# Patient Record
Sex: Male | Born: 1970 | Race: White | Hispanic: No | Marital: Single | Smoking: Never smoker
Health system: Southern US, Community
[De-identification: ages and names within clinical notes are randomized; demographics above are authoritative.]

## PROBLEM LIST (undated history)

## (undated) DIAGNOSIS — F909 Attention-deficit hyperactivity disorder, unspecified type: Secondary | ICD-10-CM

## (undated) DIAGNOSIS — F329 Major depressive disorder, single episode, unspecified: Secondary | ICD-10-CM

## (undated) DIAGNOSIS — F1011 Alcohol abuse, in remission: Secondary | ICD-10-CM

## (undated) DIAGNOSIS — I712 Thoracic aortic aneurysm, without rupture, unspecified: Secondary | ICD-10-CM

## (undated) DIAGNOSIS — F419 Anxiety disorder, unspecified: Secondary | ICD-10-CM

## (undated) DIAGNOSIS — R569 Unspecified convulsions: Secondary | ICD-10-CM

## (undated) DIAGNOSIS — I10 Essential (primary) hypertension: Secondary | ICD-10-CM

## (undated) DIAGNOSIS — I2699 Other pulmonary embolism without acute cor pulmonale: Secondary | ICD-10-CM

## (undated) DIAGNOSIS — I4891 Unspecified atrial fibrillation: Secondary | ICD-10-CM

## (undated) DIAGNOSIS — F32A Depression, unspecified: Secondary | ICD-10-CM

## (undated) DIAGNOSIS — F1010 Alcohol abuse, uncomplicated: Secondary | ICD-10-CM

## (undated) DIAGNOSIS — Q249 Congenital malformation of heart, unspecified: Secondary | ICD-10-CM

## (undated) DIAGNOSIS — J45909 Unspecified asthma, uncomplicated: Secondary | ICD-10-CM

## (undated) DIAGNOSIS — E785 Hyperlipidemia, unspecified: Secondary | ICD-10-CM

## (undated) DIAGNOSIS — F988 Other specified behavioral and emotional disorders with onset usually occurring in childhood and adolescence: Secondary | ICD-10-CM

## (undated) HISTORY — DX: Other pulmonary embolism without acute cor pulmonale: I26.99

## (undated) HISTORY — DX: Hyperlipidemia, unspecified: E78.5

## (undated) HISTORY — DX: Unspecified convulsions: R56.9

## (undated) HISTORY — DX: Congenital malformation of heart, unspecified: Q24.9

## (undated) HISTORY — PX: DENTAL SURGERY: SHX609

## (undated) HISTORY — DX: Thoracic aortic aneurysm, without rupture, unspecified: I71.20

## (undated) HISTORY — PX: COLONOSCOPY: SHX174

## (undated) HISTORY — PX: CARDIAC SURGERY: SHX584

## (undated) HISTORY — PX: CARDIAC CATHETERIZATION: SHX172

---

## 2002-01-23 HISTORY — PX: CHOLECYSTECTOMY: SHX55

## 2002-01-23 HISTORY — PX: LIVER BIOPSY: SHX301

## 2012-03-20 DIAGNOSIS — E559 Vitamin D deficiency, unspecified: Secondary | ICD-10-CM | POA: Insufficient documentation

## 2013-07-16 DIAGNOSIS — F1010 Alcohol abuse, uncomplicated: Secondary | ICD-10-CM | POA: Insufficient documentation

## 2013-11-06 DIAGNOSIS — F329 Major depressive disorder, single episode, unspecified: Secondary | ICD-10-CM | POA: Insufficient documentation

## 2013-11-06 DIAGNOSIS — E782 Mixed hyperlipidemia: Secondary | ICD-10-CM | POA: Insufficient documentation

## 2013-11-06 DIAGNOSIS — F32A Depression, unspecified: Secondary | ICD-10-CM | POA: Insufficient documentation

## 2013-11-06 DIAGNOSIS — F988 Other specified behavioral and emotional disorders with onset usually occurring in childhood and adolescence: Secondary | ICD-10-CM | POA: Insufficient documentation

## 2013-11-06 DIAGNOSIS — J45909 Unspecified asthma, uncomplicated: Secondary | ICD-10-CM | POA: Insufficient documentation

## 2013-11-06 DIAGNOSIS — K7010 Alcoholic hepatitis without ascites: Secondary | ICD-10-CM | POA: Insufficient documentation

## 2015-04-13 DIAGNOSIS — K852 Alcohol induced acute pancreatitis without necrosis or infection: Secondary | ICD-10-CM | POA: Insufficient documentation

## 2015-04-13 DIAGNOSIS — K700 Alcoholic fatty liver: Secondary | ICD-10-CM | POA: Insufficient documentation

## 2015-09-24 ENCOUNTER — Ambulatory Visit: Payer: Self-pay | Admitting: Family Medicine

## 2015-11-09 ENCOUNTER — Emergency Department: Payer: Self-pay

## 2015-11-09 ENCOUNTER — Encounter: Payer: Self-pay | Admitting: Emergency Medicine

## 2015-11-09 ENCOUNTER — Emergency Department
Admission: EM | Admit: 2015-11-09 | Discharge: 2015-11-09 | Disposition: A | Discharge status: Home / Self Care | Payer: Self-pay | Attending: Emergency Medicine | Admitting: Emergency Medicine

## 2015-11-09 DIAGNOSIS — Y999 Unspecified external cause status: Secondary | ICD-10-CM | POA: Insufficient documentation

## 2015-11-09 DIAGNOSIS — Y929 Unspecified place or not applicable: Secondary | ICD-10-CM | POA: Insufficient documentation

## 2015-11-09 DIAGNOSIS — Y939 Activity, unspecified: Secondary | ICD-10-CM | POA: Insufficient documentation

## 2015-11-09 DIAGNOSIS — F909 Attention-deficit hyperactivity disorder, unspecified type: Secondary | ICD-10-CM | POA: Insufficient documentation

## 2015-11-09 DIAGNOSIS — X58XXXA Exposure to other specified factors, initial encounter: Secondary | ICD-10-CM | POA: Insufficient documentation

## 2015-11-09 DIAGNOSIS — S39012A Strain of muscle, fascia and tendon of lower back, initial encounter: Secondary | ICD-10-CM | POA: Insufficient documentation

## 2015-11-09 DIAGNOSIS — J45909 Unspecified asthma, uncomplicated: Secondary | ICD-10-CM | POA: Insufficient documentation

## 2015-11-09 HISTORY — DX: Major depressive disorder, single episode, unspecified: F32.9

## 2015-11-09 HISTORY — DX: Attention-deficit hyperactivity disorder, unspecified type: F90.9

## 2015-11-09 HISTORY — DX: Unspecified asthma, uncomplicated: J45.909

## 2015-11-09 HISTORY — DX: Depression, unspecified: F32.A

## 2015-11-09 HISTORY — DX: Anxiety disorder, unspecified: F41.9

## 2015-11-09 MED ORDER — PREDNISONE 10 MG (21) PO TBPK: ORAL_TABLET | ORAL | 0 refills | Status: DC

## 2015-11-09 MED ORDER — IBUPROFEN 800 MG PO TABS: 800.0000 mg | ORAL_TABLET | Freq: Three times a day (TID) | ORAL | 0 refills | Status: DC | PRN

## 2015-11-09 MED ORDER — KETOROLAC TROMETHAMINE 30 MG/ML IJ SOLN
30.0000 mg | Freq: Once | INTRAMUSCULAR | Status: AC
  Administered 2015-11-09: 30 mg via INTRAVENOUS
  Filled 2015-11-09: qty 1

## 2015-11-09 MED ORDER — DEXAMETHASONE SODIUM PHOSPHATE 10 MG/ML IJ SOLN
10.0000 mg | Freq: Once | INTRAMUSCULAR | Status: AC
  Administered 2015-11-09: 10 mg via INTRAVENOUS
  Filled 2015-11-09: qty 1

## 2015-11-09 MED ORDER — DIAZEPAM 5 MG/ML IJ SOLN
5.0000 mg | Freq: Once | INTRAMUSCULAR | Status: AC
  Administered 2015-11-09: 5 mg via INTRAVENOUS
  Filled 2015-11-09: qty 2

## 2015-11-09 MED ORDER — HYDROMORPHONE HCL 1 MG/ML IJ SOLN
0.5000 mg | Freq: Once | INTRAMUSCULAR | Status: AC
  Administered 2015-11-09: 0.5 mg via INTRAVENOUS
  Filled 2015-11-09: qty 1

## 2015-11-09 MED ORDER — DIAZEPAM 5 MG PO TABS: 5.0000 mg | ORAL_TABLET | Freq: Three times a day (TID) | ORAL | 0 refills | Status: DC | PRN

## 2015-11-09 NOTE — ED Notes (Signed)
Patient transported to X-ray 

## 2015-11-09 NOTE — ED Triage Notes (Signed)
Pt presents to ED with c/o lower back pain since midnight, pt denies known injury to back. Pt reports took naproxyn PTA without relief. Pt reports has been drinking alcohol tonight.

## 2015-11-09 NOTE — ED Provider Notes (Signed)
[facility] Emergency Department Provider Note        Time seen: ----------------------------------------- 7:06 AM on 11/09/2015 -----------------------------------------    I have reviewed the triage vital signs and the nursing notes.   HISTORY  Chief Complaint Back Pain    HPI Brian [name] is a 45 y.o. male who presents to ER for low back pain since midnight. Patient states he was getting up out of bed and heard a pop in his back. Since that time his had persistent low back pain. He took naproxen without relief. He does report drinking alcohol last night, has a history of chronic alcoholism but states he has been doing better with his alcohol intake. He denies recent injuries, falls or trauma. Denies heavy lifting.   Past Medical History:  Diagnosis Date  . ADHD   . Anxiety   . Asthma   . Depression     There are no active problems to display for this patient.   Past Surgical History:  Procedure Laterality Date  . CHOLECYSTECTOMY  2004    Allergies Depakote [divalproex sodium] and Penicillins  Social History Social History  Substance Use Topics  . Smoking status: Never Smoker  . Smokeless tobacco: Never Used  . Alcohol use Yes    Review of Systems Constitutional: Negative for fever. Cardiovascular: Negative for chest pain. Respiratory: Negative for shortness of breath. Gastrointestinal: Negative for abdominal pain, vomiting and diarrhea. Musculoskeletal: Positive for back pain Skin: Negative for rash. Neurological: Negative for headaches, focal weakness or numbness.  10-point ROS otherwise negative.  ____________________________________________   PHYSICAL EXAM:  VITAL SIGNS: ED Triage Vitals  Enc Vitals Group     BP 11/09/15 0427 (!) 144/97     Pulse Rate 11/09/15 0427 (!) 102     Resp 11/09/15 0427 20     Temp 11/09/15 0427 97.4 F (36.3 C)     Temp Source 11/09/15 0427 Oral     SpO2 11/09/15 0427 95 %   Weight 11/09/15 0427 236 lb (107 kg)     Height 11/09/15 0427 6\' 1"  (1.854 m)     Head Circumference --      Peak Flow --      Pain Score 11/09/15 0428 9     Pain Loc --      Pain Edu? --      Excl. in GC? --     Constitutional: Alert and oriented. Well appearing and in no distress. Eyes: Conjunctivae are normal. Normal extraocular movements. Musculoskeletal: Nontender with normal range of motion in all extremities. No lower extremity tenderness nor edema. Negative cross straight leg raise examination, the pain seems to be located in the lower LS-spine region Neurologic:  Normal speech and language. No gross focal neurologic deficits are appreciated. No radicular pain down his legs, normal sensation Skin:  Skin is warm, dry and intact. No rash noted. Psychiatric: Mood and affect are normal. Speech and behavior are normal.  ____________________________________________  ED COURSE:  Pertinent labs & imaging results that were available during my care of the patient were reviewed by me and considered in my medical decision making (see chart for details). Clinical Course  Patient presents to the ER in moderate discomfort from pain. I will give IV pain medicine and reevaluate.  Procedures ____________________________________________   RADIOLOGY Images were viewed by me  IMPRESSION: Disc space narrowing at L4-5 and L5-S1. No fracture or spondylolisthesis.  ____________________________________________  FINAL ASSESSMENT AND PLAN  Low back pain  Plan: Patient with  imaging as dictated above. Patient is in no acute distress, currently improving with pain medicine and muscle relaxants. He'll be discharged with similar, encouraged to have close outpatient follow-up with his doctor. No signs of sciatica or cauda equina syndrome   Emily FilbertWilliams, Mesha Schamberger E, MD   Note: This dictation was prepared with Dragon dictation. Any transcriptional errors that result from this process are  unintentional    Emily FilbertJonathan E Jamyrah Saur, MD 11/09/15 [phone]

## 2015-12-20 ENCOUNTER — Emergency Department: Payer: Self-pay

## 2015-12-20 ENCOUNTER — Emergency Department
Admission: EM | Admit: 2015-12-20 | Discharge: 2015-12-20 | Disposition: A | Discharge status: Home / Self Care | Payer: Self-pay | Attending: Emergency Medicine | Admitting: Emergency Medicine

## 2015-12-20 DIAGNOSIS — R000 Tachycardia, unspecified: Secondary | ICD-10-CM

## 2015-12-20 DIAGNOSIS — J45901 Unspecified asthma with (acute) exacerbation: Secondary | ICD-10-CM | POA: Insufficient documentation

## 2015-12-20 DIAGNOSIS — Z791 Long term (current) use of non-steroidal anti-inflammatories (NSAID): Secondary | ICD-10-CM | POA: Insufficient documentation

## 2015-12-20 DIAGNOSIS — F909 Attention-deficit hyperactivity disorder, unspecified type: Secondary | ICD-10-CM | POA: Insufficient documentation

## 2015-12-20 DIAGNOSIS — J0110 Acute frontal sinusitis, unspecified: Secondary | ICD-10-CM

## 2015-12-20 DIAGNOSIS — J111 Influenza due to unidentified influenza virus with other respiratory manifestations: Secondary | ICD-10-CM | POA: Insufficient documentation

## 2015-12-20 LAB — TROPONIN I: Troponin I: <0.03 ng/mL (ref <0.03)

## 2015-12-20 LAB — CBC
HEMATOCRIT: 42.5 % (ref 40.0–52.0)
Hemoglobin: 14.9 g/dL (ref 13.0–18.0)
MCH: 32.3 pg (ref 26.0–34.0)
MCHC: 35 g/dL (ref 32.0–36.0)
MCV: 92.3 fL (ref 80.0–100.0)
PLATELETS: 89 10*3/uL — AB (ref 150–440)
RBC: 4.61 MIL/uL (ref 4.40–5.90)
RDW: 12.8 % (ref 11.5–14.5)
WBC: 4.5 10*3/uL (ref 3.8–10.6)

## 2015-12-20 LAB — INFLUENZA PANEL BY PCR (TYPE A & B)
INFLBPCR: NEGATIVE
Influenza A By PCR: POSITIVE — AB

## 2015-12-20 LAB — BASIC METABOLIC PANEL
Anion gap: 11 (ref 5–15)
BUN: 13 mg/dL (ref 6–20)
CHLORIDE: 101 mmol/L (ref 101–111)
CO2: 22 mmol/L (ref 22–32)
CREATININE: 1.03 mg/dL (ref 0.61–1.24)
Calcium: 8.5 mg/dL — ABNORMAL LOW (ref 8.9–10.3)
GFR calc Af Amer: >60 mL/min (ref >60)
GLUCOSE: 113 mg/dL — AB (ref 65–99)
Potassium: 3.8 mmol/L (ref 3.5–5.1)
SODIUM: 134 mmol/L — AB (ref 135–145)

## 2015-12-20 MED ORDER — KETOROLAC TROMETHAMINE 30 MG/ML IJ SOLN
30.0000 mg | Freq: Once | INTRAMUSCULAR | Status: AC
  Administered 2015-12-20: 30 mg via INTRAVENOUS
  Filled 2015-12-20: qty 1

## 2015-12-20 MED ORDER — PREDNISONE 20 MG PO TABS
60.0000 mg | ORAL_TABLET | Freq: Once | ORAL | Status: AC
  Administered 2015-12-20: 60 mg via ORAL
  Filled 2015-12-20: qty 3

## 2015-12-20 MED ORDER — OSELTAMIVIR PHOSPHATE 75 MG PO CAPS: 75.0000 mg | ORAL_CAPSULE | Freq: Two times a day (BID) | ORAL | 0 refills | Status: AC

## 2015-12-20 MED ORDER — SODIUM CHLORIDE 0.9 % IV BOLUS (SEPSIS)
1000.0000 mL | Freq: Once | INTRAVENOUS | Status: AC
  Administered 2015-12-20: 1000 mL via INTRAVENOUS

## 2015-12-20 MED ORDER — PREDNISONE 20 MG PO TABS: 60.0000 mg | ORAL_TABLET | Freq: Every day | ORAL | 0 refills | Status: DC

## 2015-12-20 MED ORDER — CLONAZEPAM 0.5 MG PO TABS
1.0000 mg | ORAL_TABLET | Freq: Once | ORAL | Status: AC
Start: 2015-12-20 — End: 2015-12-20
  Administered 2015-12-20: 1 mg via ORAL
  Filled 2015-12-20: qty 2

## 2015-12-20 MED ORDER — OSELTAMIVIR PHOSPHATE 75 MG PO CAPS
75.0000 mg | ORAL_CAPSULE | Freq: Once | ORAL | Status: AC
  Administered 2015-12-20: 75 mg via ORAL
  Filled 2015-12-20: qty 1

## 2015-12-20 MED ORDER — IPRATROPIUM-ALBUTEROL 0.5-2.5 (3) MG/3ML IN SOLN
3.0000 mL | Freq: Once | RESPIRATORY_TRACT | Status: DC
  Filled 2015-12-20: qty 3

## 2015-12-20 MED ORDER — ALBUTEROL SULFATE (2.5 MG/3ML) 0.083% IN NEBU
5.0000 mg | INHALATION_SOLUTION | Freq: Once | RESPIRATORY_TRACT | Status: DC
  Filled 2015-12-20: qty 6

## 2015-12-20 MED ORDER — AZITHROMYCIN 500 MG PO TABS
500.0000 mg | ORAL_TABLET | Freq: Once | ORAL | Status: AC
  Administered 2015-12-20: 500 mg via ORAL
  Filled 2015-12-20: qty 1

## 2015-12-20 MED ORDER — SODIUM CHLORIDE 0.9 % IV SOLN
1000.0000 mL | Freq: Once | INTRAVENOUS | Status: AC
  Administered 2015-12-20: 1000 mL via INTRAVENOUS

## 2015-12-20 MED ORDER — AZITHROMYCIN 250 MG PO TABS: 250.0000 mg | ORAL_TABLET | Freq: Every day | ORAL | 0 refills | Status: AC

## 2015-12-20 MED ORDER — IPRATROPIUM-ALBUTEROL 0.5-2.5 (3) MG/3ML IN SOLN
3.0000 mL | Freq: Once | RESPIRATORY_TRACT | Status: AC
  Administered 2015-12-20: 3 mL via RESPIRATORY_TRACT

## 2015-12-20 MED ORDER — BENZONATATE 100 MG PO CAPS: 100.0000 mg | ORAL_CAPSULE | Freq: Four times a day (QID) | ORAL | 0 refills | Status: DC | PRN

## 2015-12-20 NOTE — ED Provider Notes (Signed)
[facility] Emergency Department Provider Note  ____________________________________________  Time seen: Approximately 7:16 AM  I have reviewed the triage vital signs and the nursing notes.   HISTORY  Chief Complaint Nasal Congestion    HPI Brian [name] is a 45 y.o. male with a history of mild intermittent asthma presenting for cough, wheezing, shortness of breath, congestion and rhinorrhea, and facial pressure. The patient reports that for the past 3-4 days, he has developed a progressively worsening cough, at times so forceful that it results in posttussive vomiting. This is associated with congestion and rhinorrhea, as well as mid face pressure that is worse with laying down and only minimally relieved with aspirin. He has had some mild exertional dyspnea without chest pain or palpitations. No lightheadedness or syncope. No sore throat or ear pain. Positive multiple sick contacts over the Thanksgiving holidays. The patient has tried pseudoephedrine without improvement, and his albuterol MDI only helps "a little." No fever or chills. No GI symptoms including nausea vomiting or diarrhea, or abdominal pain.  PMH: Mild intermittent asthma with no ED visits in the last year, no hospitalizations in the last 5 years, and no history of intubation.   Past Medical History:  Diagnosis Date  . ADHD   . Anxiety   . Asthma   . Depression     There are no active problems to display for this patient.   Past Surgical History:  Procedure Laterality Date  . CHOLECYSTECTOMY  2004    Current Outpatient Rx  . Order #: [id number] Class: Print  . Order #: [id number] Class: Print  . Order #: [id number] Class: Print    Allergies Depakote [divalproex sodium] and Penicillins  No family history on file.  Social History Social History  Substance Use Topics  . Smoking status: Never Smoker  . Smokeless tobacco: Never Used  . Alcohol use Yes    Review of  Systems Constitutional: No fever/chills.Positive general malaise. Positive difficulty sleeping due to facial pain. Eyes: No visual changes. No eye discharge. ENT: No sore throat. Positive congestion and rhinorrhea with facial pain. No ear pain. Cardiovascular: Denies chest pain. Denies palpitations. Respiratory: Positive shortness of breath.  Positive nonproductive cough with occasional posttussive vomiting. Gastrointestinal: No abdominal pain.  No nausea, no vomiting.  No diarrhea.  No constipation. Genitourinary: Negative for dysuria. Musculoskeletal: Negative for back pain. Skin: Negative for rash. Neurological: Negative for headaches. No focal numbness, tingling or weakness.   10-point ROS otherwise negative.  ____________________________________________   PHYSICAL EXAM:  VITAL SIGNS: ED Triage Vitals  Enc Vitals Group     BP 12/20/15 0625 (!) 152/85     Pulse Rate 12/20/15 0625 (!) 130     Resp 12/20/15 0625 (!) 22     Temp 12/20/15 0625 99 F (37.2 C)     Temp Source 12/20/15 0625 Oral     SpO2 12/20/15 0625 92 %     Weight 12/20/15 0620 240 lb (108.9 kg)     Height 12/20/15 0620 6\' 1"  (1.854 m)     Head Circumference --      Peak Flow --      Pain Score 12/20/15 0620 5     Pain Loc --      Pain Edu? --      Excl. in GC? --     Constitutional: Alert and oriented. Well appearing and in no acute distress. Answers questions appropriately. Eyes: Conjunctivae are normal.  EOMI. No scleral icterus.No eye discharge. Head: Atraumatic. Nose: Positive congestion/rhinnorhea.  Mouth/Throat: Mucous membranes are moist. No posterior pharyngeal erythema. No tonsillar swelling or exudate. Posterior palate is symmetric and uvula is midline. Neck: No stridor.  Supple.  No meningismus. Cardiovascular: Fast rate, regular rhythm. No murmurs, rubs or gallops.  Respiratory: Mild tachypnea without accessory muscle use or retractions. Mid to end expiratory wheezing. No rales or rhonchi.  Good air exchange. O2 sats on my examination are 92-94%.  Gastrointestinal: Soft, nontender and nondistended.  No guarding or rebound.  No peritoneal signs. Musculoskeletal: No LE edema. No ttp in the calves or palpable cords.  Negative Homan's sign. Neurologic:  A&Ox3.  Speech is clear.  Face and smile are symmetric.  EOMI.  Moves all extremities well. Skin:  Skin is warm, dry and intact. No rash noted. Psychiatric: Normal mood with mildly anxious affect.Marland Kitchen. Speech and behavior are normal.  Normal judgement.  ____________________________________________   LABS (all labs ordered are listed, but only abnormal results are displayed)  Labs Reviewed  BASIC METABOLIC PANEL  CBC  TROPONIN I  INFLUENZA PANEL BY PCR (TYPE A & B, H1N1)   ____________________________________________  EKG  ED ECG REPORT I, Rockne MenghiniNorman, Anne-Caroline, the attending physician, personally viewed and interpreted this ECG.   Date: 12/20/2015  EKG Time: 637  Rate: 123  Rhythm: sinus tachycardia  Axis: normal  Intervals:borderline prolonged QTc  ST&T Change: No ST elevation.  ____________________________________________  RADIOLOGY  No results found.  ____________________________________________   PROCEDURES  Procedure(s) performed: None  Procedures  Critical Care performed: No ____________________________________________   INITIAL IMPRESSION / ASSESSMENT AND PLAN / ED COURSE  Pertinent labs & imaging results that were available during my care of the patient were reviewed by me and considered in my medical decision making (see chart for details).  45 y.o. male with a history of asthma, nonsmoker, presenting with cough, wheezing, congestion and rhinorrhea with facial pain, and sinus tachycardia. Overall, the patient is nontoxic in appearance. His oxygen saturations are maintained 92-94%. I am concerned about a viral etiology leading to acute asthma exacerbation. I will treat his wheezing with steroids,  as well as a DuoNeb and reevaluate the patient as well as O2 sats with stimulation afterwards. He may also have a bacterial overgrowth, including pneumonia so a get a chest x-ray, or bacterial sinusitis, so I'll start him on azithromycin. I do not see evidence of myocarditis or pericarditis on his EKG, it is unlikely that his sinus tachycardia is driven by an acute cardiac cause. If he has been having posttussive vomiting and significant coughing, dehydration may also be driving his tachycardia. Plan reevaluation for final disposition.  ----------------------------------------- 9:26 AM on 12/20/2015 -----------------------------------------  The patient is feeling better, but he continues to be tachycardic with a heart rate of 122 after 1.5 L of fluid. We will give him an additional 1.5 L and reevaluate him. His influenza testing is positive, so I will start him on Tamiflu. Although he has had symptoms for greater than 24 hours, this patient has increased risk due to his underlying asthma as well as his persistent tachycardia, so Tamiflu is indicated in this particular case. There is no pneumonia on his chest x-ray. Plan reevaluation and final disposition.  ----------------------------------------- 10:36 AM on 12/20/2015 -----------------------------------------  After fluid, symptomatic treatment, and the patient's home dose of Klonopin, the patient's repeat heart rate is 92.  With ambulation, the patient was able to maintain oxygen saturation of greater than 92%.  His xray does not show pneumonia.  I have given him strict instructions  about influenza, and we'll proceed with treatment for his sinusitis with azithromycin. He will additionally be given prednisone for his asthma exacerbation. Patient will be discharged at this time in stable condition, and understands return precautions as well as follow-up instructions. ____________________________________________  FINAL CLINICAL IMPRESSION(S) / ED  DIAGNOSES  Final diagnoses:  None    Clinical Course       NEW MEDICATIONS STARTED DURING THIS VISIT:  New Prescriptions   No medications on file      Rockne MenghiniAnne-Caroline Shahad Mazurek, MD 12/20/15 1038

## 2015-12-20 NOTE — Discharge Instructions (Signed)
Today, you tested positive for influenza. This is a contagious disease, so please practice frequent and good handwashing to prevent the spread of infection. Please avoid contact with young children or infants, elderly people, or anybody with a compromised immune system.  For your sinus infection, please take the entire course of azithromycin.  You may continue to use your albuterol inhaler for wheezing, and additionally take 5 days of prednisone to decrease the inflammation in your lungs. Tessalon Perles for cough and/or for symptomatic relief only.  Return to the emergency department if you develop severe pain, shortness of breath, palpitations, lightheadedness or fainting, inability to keep down fluids, or any other symptoms concerning to you.

## 2015-12-20 NOTE — ED Notes (Signed)
Patient transported to X-ray 

## 2015-12-20 NOTE — ED Triage Notes (Signed)
Patient reports symptoms started Saturday with congestion and cough.  Reports generalized body aches.  Reports using several over the counter medications without relief.

## 2015-12-20 NOTE — ED Notes (Signed)
Walked patient around patient's o2 stats dropped into 92 % notified nurse Morrie SheldonAshley

## 2015-12-20 NOTE — ED Notes (Signed)
Pt ambulated approximately 100 feet; pt O2 saturation maintained at 92%.  Pt reports slight dizziness, denies any shortness of breath.

## 2015-12-20 NOTE — ED Notes (Signed)
Pharmacy notified to send tamiflu. 

## 2016-03-15 ENCOUNTER — Emergency Department: Payer: Self-pay

## 2016-03-15 ENCOUNTER — Emergency Department
Admission: EM | Admit: 2016-03-15 | Discharge: 2016-03-16 | Disposition: A | Discharge status: Home / Self Care | Payer: Self-pay | Attending: Emergency Medicine | Admitting: Emergency Medicine

## 2016-03-15 ENCOUNTER — Encounter: Payer: Self-pay | Admitting: Intensive Care

## 2016-03-15 DIAGNOSIS — J45909 Unspecified asthma, uncomplicated: Secondary | ICD-10-CM | POA: Insufficient documentation

## 2016-03-15 DIAGNOSIS — E871 Hypo-osmolality and hyponatremia: Secondary | ICD-10-CM | POA: Insufficient documentation

## 2016-03-15 DIAGNOSIS — I10 Essential (primary) hypertension: Secondary | ICD-10-CM | POA: Insufficient documentation

## 2016-03-15 DIAGNOSIS — Z79899 Other long term (current) drug therapy: Secondary | ICD-10-CM | POA: Insufficient documentation

## 2016-03-15 DIAGNOSIS — F10230 Alcohol dependence with withdrawal, uncomplicated: Secondary | ICD-10-CM

## 2016-03-15 DIAGNOSIS — F10930 Alcohol use, unspecified with withdrawal, uncomplicated: Secondary | ICD-10-CM

## 2016-03-15 DIAGNOSIS — E876 Hypokalemia: Secondary | ICD-10-CM | POA: Insufficient documentation

## 2016-03-15 DIAGNOSIS — F10120 Alcohol abuse with intoxication, uncomplicated: Secondary | ICD-10-CM | POA: Insufficient documentation

## 2016-03-15 DIAGNOSIS — R079 Chest pain, unspecified: Secondary | ICD-10-CM

## 2016-03-15 DIAGNOSIS — R42 Dizziness and giddiness: Secondary | ICD-10-CM | POA: Insufficient documentation

## 2016-03-15 HISTORY — DX: Essential (primary) hypertension: I10

## 2016-03-15 HISTORY — DX: Alcohol abuse, uncomplicated: F10.10

## 2016-03-15 LAB — CBC WITH DIFFERENTIAL/PLATELET
Basophils Absolute: 0.1 10*3/uL (ref 0–0.1)
Basophils Relative: 1 %
EOS PCT: 6 %
Eosinophils Absolute: 0.5 10*3/uL (ref 0–0.7)
HCT: 46.2 % (ref 40.0–52.0)
Hemoglobin: 16.3 g/dL (ref 13.0–18.0)
LYMPHS ABS: 2.3 10*3/uL (ref 1.0–3.6)
LYMPHS PCT: 28 %
MCH: 31.5 pg (ref 26.0–34.0)
MCHC: 35.2 g/dL (ref 32.0–36.0)
MCV: 89.4 fL (ref 80.0–100.0)
MONO ABS: 0.5 10*3/uL (ref 0.2–1.0)
Monocytes Relative: 6 %
Neutro Abs: 5 10*3/uL (ref 1.4–6.5)
Neutrophils Relative %: 59 %
PLATELETS: 131 10*3/uL — AB (ref 150–440)
RBC: 5.17 MIL/uL (ref 4.40–5.90)
RDW: 12.7 % (ref 11.5–14.5)
WBC: 8.4 10*3/uL (ref 3.8–10.6)

## 2016-03-15 LAB — URINE DRUG SCREEN, QUALITATIVE (ARMC ONLY)
Amphetamines, Ur Screen: NOT DETECTED
BARBITURATES, UR SCREEN: NOT DETECTED
BENZODIAZEPINE, UR SCRN: NOT DETECTED
Cannabinoid 50 Ng, Ur ~~LOC~~: NOT DETECTED
Cocaine Metabolite,Ur ~~LOC~~: NOT DETECTED
MDMA (Ecstasy)Ur Screen: NOT DETECTED
METHADONE SCREEN, URINE: NOT DETECTED
OPIATE, UR SCREEN: NOT DETECTED
Phencyclidine (PCP) Ur S: NOT DETECTED
Tricyclic, Ur Screen: NOT DETECTED

## 2016-03-15 LAB — COMPREHENSIVE METABOLIC PANEL
ALT: 137 U/L — ABNORMAL HIGH (ref 17–63)
ANION GAP: 19 — AB (ref 5–15)
AST: 151 U/L — ABNORMAL HIGH (ref 15–41)
Albumin: 5.1 g/dL — ABNORMAL HIGH (ref 3.5–5.0)
Alkaline Phosphatase: 115 U/L (ref 38–126)
BUN: 18 mg/dL (ref 6–20)
CHLORIDE: 89 mmol/L — AB (ref 101–111)
CO2: 22 mmol/L (ref 22–32)
CREATININE: 0.98 mg/dL (ref 0.61–1.24)
Calcium: 8.9 mg/dL (ref 8.9–10.3)
Glucose, Bld: 134 mg/dL — ABNORMAL HIGH (ref 65–99)
POTASSIUM: 2.8 mmol/L — AB (ref 3.5–5.1)
SODIUM: 130 mmol/L — AB (ref 135–145)
Total Bilirubin: 2.2 mg/dL — ABNORMAL HIGH (ref 0.3–1.2)
Total Protein: 8.8 g/dL — ABNORMAL HIGH (ref 6.5–8.1)

## 2016-03-15 LAB — ETHANOL: Alcohol, Ethyl (B): <5 mg/dL (ref <5)

## 2016-03-15 LAB — MAGNESIUM: MAGNESIUM: 1.2 mg/dL — AB (ref 1.7–2.4)

## 2016-03-15 LAB — LIPASE, BLOOD: LIPASE: 20 U/L (ref 11–51)

## 2016-03-15 LAB — TROPONIN I: Troponin I: <0.03 ng/mL (ref <0.03)

## 2016-03-15 MED ORDER — POTASSIUM CHLORIDE CRYS ER 20 MEQ PO TBCR
40.0000 meq | EXTENDED_RELEASE_TABLET | Freq: Once | ORAL | Status: AC
  Administered 2016-03-15: 40 meq via ORAL
  Filled 2016-03-15: qty 2

## 2016-03-15 MED ORDER — ONDANSETRON 4 MG PO TBDP
ORAL_TABLET | ORAL | Status: AC
  Administered 2016-03-15: 4 mg via ORAL
  Filled 2016-03-15: qty 1

## 2016-03-15 MED ORDER — THIAMINE HCL 100 MG/ML IJ SOLN: 100.0000 mg | Freq: Every day | INTRAMUSCULAR | Status: DC

## 2016-03-15 MED ORDER — ONDANSETRON 4 MG PO TBDP
4.0000 mg | ORAL_TABLET | Freq: Once | ORAL | Status: AC
  Administered 2016-03-15: 4 mg via ORAL

## 2016-03-15 MED ORDER — LORAZEPAM 2 MG PO TABS: 0.0000 mg | ORAL_TABLET | Freq: Two times a day (BID) | ORAL | Status: DC

## 2016-03-15 MED ORDER — LORAZEPAM 2 MG PO TABS
0.0000 mg | ORAL_TABLET | Freq: Four times a day (QID) | ORAL | Status: DC
  Administered 2016-03-15 (×2): 2 mg via ORAL
  Filled 2016-03-15 (×2): qty 1

## 2016-03-15 MED ORDER — MAGNESIUM CHLORIDE 64 MG PO TBEC
2.0000 | DELAYED_RELEASE_TABLET | Freq: Once | ORAL | Status: AC
  Administered 2016-03-15: 128 mg via ORAL
  Filled 2016-03-15: qty 2

## 2016-03-15 MED ORDER — VITAMIN B-1 100 MG PO TABS
100.0000 mg | ORAL_TABLET | Freq: Every day | ORAL | Status: DC
  Administered 2016-03-15: 100 mg via ORAL
  Filled 2016-03-15: qty 1

## 2016-03-15 MED ORDER — ONDANSETRON 4 MG PO TBDP
4.0000 mg | ORAL_TABLET | Freq: Once | ORAL | Status: AC
  Administered 2016-03-15: 4 mg via ORAL
  Filled 2016-03-15: qty 1

## 2016-03-15 NOTE — Discharge Instructions (Addendum)
You were offered a room at [facility] in the morning and to stay in the Emergency Department (ED) overnight, but you declined to stay.  Please go to [facility] in the morning.  Return to the ER for any worsening symptoms including confusion or altered mental status, seizure, fever, chest pain, nausea, sweats, dizziness or passing out, or any other symptoms concerning to you.

## 2016-03-15 NOTE — ED Triage Notes (Signed)
Patient arrived by EMS from home. Pt reports he was trying to rest and lay down at home when he started having sudden onset of diaphoresis, heart palpitations, and dizziness. Pt reports he drinks a 5th a day and has not had a drink in 12 hours. Not an effort to quit drinking, he just hadn't had a chance to drink today. A& O x3 at this time. Pt is very diaphoretic upon arrival

## 2016-03-15 NOTE — ED Provider Notes (Signed)
[facility] Emergency Department Provider Note ____________________________________________   I have reviewed the triage vital signs and the triage nursing note.  HISTORY  Chief Complaint Alcohol Intoxication dizzy, chest pressure, sweating concern for alcohol withdrawal   Historian Patient  HPI Brian [name] is a 46 y.o. male with a history of anxiety depression, ADHD, history of heavy daily alcohol abuse, states that he typically drinks a fifth of alcohol daily, last alcoholic drink around 10 PM last night, over 12 hours ago when he was driving home from his girlfriend's house in [place], [place] and started feeling lightheaded and dizzy along with sweaty. He had some central chest pressure without specifically palpitations or chest pain. He has a history of frequent/recurrent episodes with pancreatitis, and report some mild epigastric burning, but not severe. He had moderate nausea with several episodes of watery emesis with streaks of blood. States that he is interested in alcohol detox.  Patient received Ativan upon arrival and is feeling significantly improved at the time of my evaluation.    Past Medical History:  Diagnosis Date  . ADHD   . Alcohol abuse   . Anxiety   . Asthma   . Depression   . Hypertension     There are no active problems to display for this patient.   Past Surgical History:  Procedure Laterality Date  . CHOLECYSTECTOMY  2004    Prior to Admission medications   Medication Sig Start Date End Date Taking? Authorizing Provider  benzonatate (TESSALON PERLES) 100 MG capsule Take 1 capsule (100 mg total) by mouth every 6 (six) hours as needed for cough. 12/20/15   Anne-Caroline Sharma Covert, MD  diazepam (VALIUM) 5 MG tablet Take 1 tablet (5 mg total) by mouth every 8 (eight) hours as needed for muscle spasms. 11/09/15   Emily Filbert, MD  ibuprofen (ADVIL,MOTRIN) 800 MG tablet Take 1 tablet (800 mg total) by  mouth every 8 (eight) hours as needed. 11/09/15   Emily Filbert, MD  predniSONE (DELTASONE) 20 MG tablet Take 3 tablets (60 mg total) by mouth daily. 12/20/15   Rockne Menghini, MD  sertraline (ZOLOFT) 100 MG tablet Take 100 mg by mouth 2 (two) times daily. 02/16/16   Historical Provider, MD    Allergies  Allergen Reactions  . Depakote [Divalproex Sodium] Anxiety  . Penicillins Rash    History reviewed. No pertinent family history.  Social History Social History  Substance Use Topics  . Smoking status: Never Smoker  . Smokeless tobacco: Never Used  . Alcohol use Yes     Comment: drinks a 5th a day    Review of Systems  Constitutional: Negative for fever Or cough congestion or fevers. Eyes: Negative for visual changes. ENT: Negative for sore throat. Cardiovascular: Some chest tightness and lower chest/upper abdominal burning earlier, essentially gone now. Respiratory: Negative for shortness of breath. Gastrointestinal: Negative for diarrhea. Genitourinary: Negative for dysuria. Musculoskeletal: Negative for back pain. Skin: Negative for rash. Neurological: Negative for headache. 10 point Review of Systems otherwise negative ____________________________________________   PHYSICAL EXAM:  VITAL SIGNS: ED Triage Vitals  Enc Vitals Group     BP 03/15/16 1420 (!) 143/93     Pulse Rate 03/15/16 1420 96     Resp 03/15/16 1420 15     Temp 03/15/16 1414 97.6 F (36.4 C)     Temp Source 03/15/16 1414 Oral     SpO2 03/15/16 1420 97 %     Weight 03/15/16 1419 243 lb (  110.2 kg)     Height 03/15/16 1419 6\' 1"  (1.854 m)     Head Circumference --      Peak Flow --      Pain Score --      Pain Loc --      Pain Edu? --      Excl. in GC? --      Constitutional: Alert and oriented. Well appearing and in no distress. HEENT   Head: Normocephalic and atraumatic.      Eyes: Conjunctivae are normal. PERRL. Normal extraocular movements.      Ears:         Nose: No  congestion/rhinnorhea.   Mouth/Throat: Mucous membranes are moist.   Neck: No stridor. Cardiovascular/Chest: Normal rate, regular rhythm.  No murmurs, rubs, or gallops. Respiratory: Normal respiratory effort without tachypnea nor retractions. Breath sounds are clear and equal bilaterally. No wheezes/rales/rhonchi. Gastrointestinal: Soft. No distention, no guarding, no rebound. Mild epigastric discomfort.  Genitourinary/rectal:Deferred Musculoskeletal: Nontender with normal range of motion in all extremities. No joint effusions.  No lower extremity tenderness.  No edema. Neurologic:  Normal speech and language. No gross or focal neurologic deficits are appreciated. Skin:  Skin is warm, dry and intact. No rash noted. Psychiatric: Overall mood and affect are normal. Speech and behavior are normal. Patient exhibits appropriate insight and judgment.  No suicidal or homicidal ideation.   ____________________________________________  LABS (pertinent positives/negatives)  Labs Reviewed  CBC WITH DIFFERENTIAL/PLATELET - Abnormal; Notable for the following:       Result Value   Platelets 131 (*)    All other components within normal limits  COMPREHENSIVE METABOLIC PANEL - Abnormal; Notable for the following:    Sodium 130 (*)    Potassium 2.8 (*)    Chloride 89 (*)    Glucose, Bld 134 (*)    Total Protein 8.8 (*)    Albumin 5.1 (*)    AST 151 (*)    ALT 137 (*)    Total Bilirubin 2.2 (*)    Anion gap 19 (*)    All other components within normal limits  MAGNESIUM - Abnormal; Notable for the following:    Magnesium 1.2 (*)    All other components within normal limits  TROPONIN I  TROPONIN I  LIPASE, BLOOD  URINE DRUG SCREEN, QUALITATIVE ([facility] ONLY)  ETHANOL    ____________________________________________    EKG I, Governor Rooksebecca Gurbani Figge, MD, the attending physician have personally viewed and interpreted all ECGs.  92 bpm. Normal sinus rhythm.  normal axis. Nonspecific ST and T-wave  with flattening and inverted T waves inferiorly and laterally.  I am unable to see the actual image of the EKG from 12/20/15, but does not note specific or nonspecific ST or T-wave findings.  Repeat EKG. 95 bpm. Normal sinus rhythm. Narrow QRS. Normal axis. T waves inverted inferiorly and laterally, similar to prior EKG today ____________________________________________  RADIOLOGY All Xrays were viewed by me. Imaging interpreted by Radiologist.  Chest x-ray two-view: No active cardiopulmonary disease.  CT without contrast:  IMPRESSION: 1. No acute intracranial pathology seen on CT. 2. Mild cortical volume loss noted. __________________________________________  PROCEDURES  Procedure(s) performed: None  Critical Care performed: None  ____________________________________________   ED COURSE / ASSESSMENT AND PLAN  Pertinent labs & imaging results that were available during my care of the patient were reviewed by me and considered in my medical decision making (see chart for details).   Mr. Jaynie Crumblearasewich states that his symptoms reminded him either  of early pancreatitis, although not so much now that pain has eased off, or really bad alcohol withdrawal. Given the chest discomfort and sweats, cardiac evaluation was initiated as well. EKG is nonspecific, but with some T waves inverted laterally and unclear whether or not this is new as I don't have a image of an EKG to compare to.  Chest and epigastric discomfort are essentially gone at this point time. I suspect that his symptoms or from alcohol withdrawal and improved after nausea medication and Ativan. However you going to check a repeat troponin and EKG. Patient is requested to speak with the behavioral health counselor for options regarding alcohol detox programs.  No evidence for need for involuntary commitment.  Patient has multiple joint abnormalities which are likely due to his chronic alcohol use. He was given IV fluids for  hyponatremia. He was given magnesium and potassium supplementation by by mouth.  He is not having any ongoing chest pain. His troponin repeated this still negative, and EKG repeated is unchanged from earlier today although no priors for comparison.  I'm most suspicious that his constellation of symptoms are due to alcohol withdrawal. He feels somewhat better after Ativan. Social worker was able to obtain that there will be a bed available at residential treatment services trauma morning. I'm going to have him monitored and treated for alcohol withdrawal's symptoms here overnight while he is here. He'll be likely will be discharged tomorrow to go to [facility].  He was given info about outpatient follow up with primary care and cardiology.  Will be signed out to overnight doctor at shift change 11pm.  CONSULTATIONS:   TTS.   Patient / Family / Caregiver informed of clinical course, medical decision-making process, and agree with plan.  ___________________________________________   FINAL CLINICAL IMPRESSION(S) / ED DIAGNOSES   Final diagnoses:  Alcohol withdrawal syndrome without complication (HCC)  Hyponatremia  Hypomagnesemia  Hypokalemia  Chest pain, unspecified type              Note: This dictation was prepared with Dragon dictation. Any transcriptional errors that result from this process are unintentional    Governor Rooks, MD 03/15/16 2132

## 2016-03-16 NOTE — ED Notes (Signed)
Pt discharged to home.  Family member driving.  Discharge instructions reviewed.  Verbalized understanding.  No questions or concerns at this time.  Teach back verified.  Pt in NAD.  No items left in ED.   

## 2016-03-16 NOTE — ED Notes (Signed)
Pt would like to go home and follow up with [facility] outpatient instead of staying and waiting for [facility] in AM.  EDP notified of pt's request.

## 2016-03-16 NOTE — ED Provider Notes (Signed)
Clinical Course as of Mar 16 18  Thu Mar 16, 2016  0018 The patient decided he wants to go home rather than wait for RTS in the AM.  Dr. Shaune PollackLord feels this is acceptable since he does not meet inpatient nor IVC criteria.  She prepared discharge instructions and I am printing them and discharge the patient according to his wishes.  He is not intoxication and has the capacity to make his own decisions.  [CF]    Clinical Course User Index [CF] Loleta Roseory Tamiah Dysart, MD      Loleta Roseory Lamiracle Chaidez, MD 03/16/16 [phone]

## 2016-04-07 ENCOUNTER — Emergency Department: Payer: Self-pay

## 2016-04-07 ENCOUNTER — Emergency Department
Admission: EM | Admit: 2016-04-07 | Discharge: 2016-04-07 | Disposition: A | Discharge status: Home / Self Care | Payer: Self-pay | Attending: Emergency Medicine | Admitting: Emergency Medicine

## 2016-04-07 ENCOUNTER — Encounter: Payer: Self-pay | Admitting: Emergency Medicine

## 2016-04-07 DIAGNOSIS — IMO0002 Reserved for concepts with insufficient information to code with codable children: Secondary | ICD-10-CM

## 2016-04-07 DIAGNOSIS — Z79899 Other long term (current) drug therapy: Secondary | ICD-10-CM | POA: Insufficient documentation

## 2016-04-07 DIAGNOSIS — I10 Essential (primary) hypertension: Secondary | ICD-10-CM | POA: Insufficient documentation

## 2016-04-07 DIAGNOSIS — K852 Alcohol induced acute pancreatitis without necrosis or infection: Secondary | ICD-10-CM | POA: Insufficient documentation

## 2016-04-07 DIAGNOSIS — J45909 Unspecified asthma, uncomplicated: Secondary | ICD-10-CM | POA: Insufficient documentation

## 2016-04-07 DIAGNOSIS — F909 Attention-deficit hyperactivity disorder, unspecified type: Secondary | ICD-10-CM | POA: Insufficient documentation

## 2016-04-07 LAB — COMPREHENSIVE METABOLIC PANEL
ALT: 98 U/L — ABNORMAL HIGH (ref 17–63)
AST: 86 U/L — ABNORMAL HIGH (ref 15–41)
Albumin: 4.6 g/dL (ref 3.5–5.0)
Alkaline Phosphatase: 96 U/L (ref 38–126)
Anion gap: 10 (ref 5–15)
BUN: 15 mg/dL (ref 6–20)
CHLORIDE: 98 mmol/L — AB (ref 101–111)
CO2: 27 mmol/L (ref 22–32)
Calcium: 8.6 mg/dL — ABNORMAL LOW (ref 8.9–10.3)
Creatinine, Ser: 0.69 mg/dL (ref 0.61–1.24)
Glucose, Bld: 109 mg/dL — ABNORMAL HIGH (ref 65–99)
POTASSIUM: 3.8 mmol/L (ref 3.5–5.1)
SODIUM: 135 mmol/L (ref 135–145)
Total Bilirubin: 0.8 mg/dL (ref 0.3–1.2)
Total Protein: 8.3 g/dL — ABNORMAL HIGH (ref 6.5–8.1)

## 2016-04-07 LAB — CBC
HEMATOCRIT: 48.1 % (ref 40.0–52.0)
Hemoglobin: 16.8 g/dL (ref 13.0–18.0)
MCH: 31.9 pg (ref 26.0–34.0)
MCHC: 35 g/dL (ref 32.0–36.0)
MCV: 91.2 fL (ref 80.0–100.0)
Platelets: 136 10*3/uL — ABNORMAL LOW (ref 150–440)
RBC: 5.28 MIL/uL (ref 4.40–5.90)
RDW: 13.4 % (ref 11.5–14.5)
WBC: 7.4 10*3/uL (ref 3.8–10.6)

## 2016-04-07 LAB — ETHANOL: Alcohol, Ethyl (B): 102 mg/dL — ABNORMAL HIGH (ref <5)

## 2016-04-07 LAB — LIPASE, BLOOD: Lipase: 137 U/L — ABNORMAL HIGH (ref 11–51)

## 2016-04-07 MED ORDER — OXYCODONE HCL 5 MG PO TABS: 5.0000 mg | ORAL_TABLET | Freq: Three times a day (TID) | ORAL | 0 refills | Status: DC | PRN

## 2016-04-07 MED ORDER — LORAZEPAM 2 MG PO TABS
2.0000 mg | ORAL_TABLET | Freq: Once | ORAL | Status: AC
  Administered 2016-04-07: 2 mg via ORAL
  Filled 2016-04-07: qty 1

## 2016-04-07 MED ORDER — HYDROMORPHONE HCL 1 MG/ML IJ SOLN
1.0000 mg | Freq: Once | INTRAMUSCULAR | Status: AC
  Administered 2016-04-07: 1 mg via INTRAVENOUS
  Filled 2016-04-07: qty 1

## 2016-04-07 MED ORDER — ONDANSETRON HCL 4 MG/2ML IJ SOLN
4.0000 mg | Freq: Once | INTRAMUSCULAR | Status: AC
  Administered 2016-04-07: 4 mg via INTRAVENOUS
  Filled 2016-04-07: qty 2

## 2016-04-07 MED ORDER — ONDANSETRON 4 MG PO TBDP: 4.0000 mg | ORAL_TABLET | Freq: Three times a day (TID) | ORAL | 0 refills | Status: DC | PRN

## 2016-04-07 MED ORDER — SODIUM CHLORIDE 0.9 % IV SOLN
Freq: Once | INTRAVENOUS | Status: AC
  Administered 2016-04-07: 12:00:00 via INTRAVENOUS

## 2016-04-07 MED ORDER — FAMOTIDINE IN NACL 20-0.9 MG/50ML-% IV SOLN
20.0000 mg | Freq: Once | INTRAVENOUS | Status: AC
  Administered 2016-04-07: 20 mg via INTRAVENOUS
  Filled 2016-04-07: qty 50

## 2016-04-07 NOTE — ED Notes (Signed)
Attempted IV access x 2. Unable to get blood.

## 2016-04-07 NOTE — ED Triage Notes (Addendum)
Pt reports he is a heavy drinker drank 1/5 of liquor up until 5am. Pain to epigastric region started about 7am. Pt reports pain feels like pancreatitis which he has had before.  Daily drinker. No desire to stop drinking. Has had multiple opportunities to stop, but nothing has stuck. Pt states Dilaudid and Ativan work for him for pain control related to pancreatitis. Denies any SI/HI.

## 2016-04-07 NOTE — ED Notes (Signed)
Pt transported to x-ray by Dawn, rad tech

## 2016-04-07 NOTE — ED Provider Notes (Signed)
[facility] Emergency Department Provider Note        Time seen: ----------------------------------------- 11:23 AM on 04/07/2016 -----------------------------------------    I have reviewed the triage vital signs and the nursing notes.   HISTORY  Chief Complaint Abdominal Pain    HPI Brian [name] is a 46 y.o. male who presents to ER for epigastric pain that started at 7 AM. Patient now feels like his pancreatitis which she's had before. He is a daily drinker, he has no desire to stop drinking. Patient states a lot of Ativan have helped in the past for pain. Currently pain is 9 out of 10 in the epigastrium. Nothing is made his symptoms worse.   Past Medical History:  Diagnosis Date  . ADHD   . Alcohol abuse   . Anxiety   . Asthma   . Depression   . Hypertension     There are no active problems to display for this patient.   Past Surgical History:  Procedure Laterality Date  . CHOLECYSTECTOMY  2004    Allergies Acetaminophen; Lithium; Sulfa antibiotics; Valproic acid; Depakote [divalproex sodium]; and Penicillins  Social History Social History  Substance Use Topics  . Smoking status: Never Smoker  . Smokeless tobacco: Never Used  . Alcohol use Yes     Comment: drinks a 5th a day    Review of Systems Constitutional: Negative for fever. Cardiovascular: Negative for chest pain. Respiratory: Negative for shortness of breath. Gastrointestinal: Positive for abdominal pain, nausea Genitourinary: Negative for dysuria. Musculoskeletal: Negative for back pain. Skin: Negative for rash. Neurological: Negative for headaches, focal weakness or numbness.  10-point ROS otherwise negative.  ____________________________________________   PHYSICAL EXAM:  VITAL SIGNS: ED Triage Vitals  Enc Vitals Group     BP 04/07/16 0947 134/89     Pulse Rate 04/07/16 0947 85     Resp 04/07/16 0947 20     Temp 04/07/16 0935 97.9 F (36.6 C)   Temp Source 04/07/16 0935 Oral     SpO2 04/07/16 0947 97 %     Weight 04/07/16 0948 245 lb (111.1 kg)     Height 04/07/16 0948 6\' 1"  (1.854 m)     Head Circumference --      Peak Flow --      Pain Score 04/07/16 0953 9     Pain Loc --      Pain Edu? --      Excl. in GC? --     Constitutional: Alert and oriented. Mild distress Eyes: Conjunctivae are normal. PERRL. Normal extraocular movements. ENT   Head: Normocephalic and atraumatic.   Nose: No congestion/rhinnorhea.   Mouth/Throat: Mucous membranes are moist.   Neck: No stridor. Cardiovascular: Normal rate, regular rhythm. No murmurs, rubs, or gallops. Respiratory: Normal respiratory effort without tachypnea nor retractions. Breath sounds are clear and equal bilaterally. No wheezes/rales/rhonchi. Gastrointestinal: Epigastric tenderness, no rebound or guarding. Normal bowel sounds. Musculoskeletal: Nontender with normal range of motion in all extremities. No lower extremity tenderness nor edema. Neurologic:  Normal speech and language. No gross focal neurologic deficits are appreciated.  Skin:  Skin is warm, dry and intact. No rash noted. Psychiatric: Mood and affect are normal. Speech and behavior are normal.  ____________________________________________  EKG: Interpreted by me. Sinus rhythm rate 84 bpm, normal PR interval, normal QRS, long QT.  ____________________________________________  ED COURSE:  Pertinent labs & imaging results that were available during my care of the patient were reviewed by me and considered in my  medical decision making (see chart for details). Patient presents ER for epigastric pain likely either pancreatitis or alcohol-induced gastritis. We will assess with labs and imaging.   Procedures ____________________________________________   LABS (pertinent positives/negatives)  Labs Reviewed  LIPASE, BLOOD - Abnormal; Notable for the following:       Result Value   Lipase 137 (*)     All other components within normal limits  COMPREHENSIVE METABOLIC PANEL - Abnormal; Notable for the following:    Chloride 98 (*)    Glucose, Bld 109 (*)    Calcium 8.6 (*)    Total Protein 8.3 (*)    AST 86 (*)    ALT 98 (*)    All other components within normal limits  CBC - Abnormal; Notable for the following:    Platelets 136 (*)    All other components within normal limits  ETHANOL - Abnormal; Notable for the following:    Alcohol, Ethyl (B) 102 (*)    All other components within normal limits  URINALYSIS, COMPLETE (UACMP) WITH MICROSCOPIC  URINE DRUG SCREEN, QUALITATIVE ([facility] ONLY)    RADIOLOGY Images were viewed by me  Abdomen 2 view IMPRESSION: Possible hepatosplenomegaly. Recommend clinical correlation. ____________________________________________  FINAL ASSESSMENT AND PLAN  Alcohol abuse, Mild pancreatitis  Plan: Patient with labs and imaging as dictated above. Patient presented to the ER with abdominal pain. Clinically his exam is benign. Labs are consistent with chronic alcoholism and mild pancreatitis. Overall he appears improved, stable for discharge. He has declined detox at this time.   Emily Filbert, MD   Note: This note was generated in part or whole with voice recognition software. Voice recognition is usually quite accurate but there are transcription errors that can and very often do occur. I apologize for any typographical errors that were not detected and corrected.     Emily Filbert, MD 04/07/16 [phone]

## 2016-09-21 ENCOUNTER — Encounter: Payer: Self-pay | Admitting: Emergency Medicine

## 2016-09-21 ENCOUNTER — Emergency Department
Admission: EM | Admit: 2016-09-21 | Discharge: 2016-09-22 | Disposition: A | Discharge status: Home / Self Care | Payer: Self-pay | Attending: Emergency Medicine | Admitting: Emergency Medicine

## 2016-09-21 DIAGNOSIS — Y9289 Other specified places as the place of occurrence of the external cause: Secondary | ICD-10-CM | POA: Insufficient documentation

## 2016-09-21 DIAGNOSIS — I10 Essential (primary) hypertension: Secondary | ICD-10-CM | POA: Insufficient documentation

## 2016-09-21 DIAGNOSIS — Y9389 Activity, other specified: Secondary | ICD-10-CM | POA: Insufficient documentation

## 2016-09-21 DIAGNOSIS — Y999 Unspecified external cause status: Secondary | ICD-10-CM | POA: Insufficient documentation

## 2016-09-21 DIAGNOSIS — F909 Attention-deficit hyperactivity disorder, unspecified type: Secondary | ICD-10-CM | POA: Insufficient documentation

## 2016-09-21 DIAGNOSIS — X58XXXA Exposure to other specified factors, initial encounter: Secondary | ICD-10-CM | POA: Insufficient documentation

## 2016-09-21 DIAGNOSIS — Z79899 Other long term (current) drug therapy: Secondary | ICD-10-CM | POA: Insufficient documentation

## 2016-09-21 DIAGNOSIS — S3994XA Unspecified injury of external genitals, initial encounter: Secondary | ICD-10-CM | POA: Insufficient documentation

## 2016-09-21 DIAGNOSIS — J45909 Unspecified asthma, uncomplicated: Secondary | ICD-10-CM | POA: Insufficient documentation

## 2016-09-21 MED ORDER — LIDOCAINE HCL (PF) 1 % IJ SOLN
INTRAMUSCULAR | Status: AC
  Filled 2016-09-21: qty 5

## 2016-09-21 MED ORDER — HYDROMORPHONE HCL 1 MG/ML IJ SOLN
INTRAMUSCULAR | Status: AC
  Administered 2016-09-21: 1 mg via INTRAVENOUS
  Filled 2016-09-21: qty 1

## 2016-09-21 MED ORDER — LIDOCAINE HCL (PF) 1 % IJ SOLN
INTRAMUSCULAR | Status: AC
  Administered 2016-09-21: 30 mL
  Filled 2016-09-21: qty 5

## 2016-09-21 MED ORDER — LIDOCAINE HCL (PF) 1 % IJ SOLN
INTRAMUSCULAR | Status: AC
  Administered 2016-09-21: 30 mL via INTRADERMAL
  Filled 2016-09-21: qty 15

## 2016-09-21 MED ORDER — HYDROMORPHONE HCL 1 MG/ML IJ SOLN
1.0000 mg | Freq: Once | INTRAMUSCULAR | Status: AC
  Administered 2016-09-21: 1 mg via INTRAVENOUS

## 2016-09-21 MED ORDER — LIDOCAINE HCL (PF) 1 % IJ SOLN
30.0000 mL | Freq: Once | INTRAMUSCULAR | Status: AC
  Administered 2016-09-21: 30 mL via INTRADERMAL

## 2016-09-21 MED ORDER — CLINDAMYCIN HCL 300 MG PO CAPS: 300.0000 mg | ORAL_CAPSULE | Freq: Three times a day (TID) | ORAL | 0 refills | Status: AC

## 2016-09-21 MED ORDER — LIDOCAINE HCL (PF) 1 % IJ SOLN
30.0000 mL | Freq: Once | INTRAMUSCULAR | Status: AC
  Administered 2016-09-21: 30 mL
  Filled 2016-09-21: qty 30

## 2016-09-21 MED ORDER — LIDOCAINE HCL (PF) 1 % IJ SOLN
30.0000 mL | Freq: Once | INTRAMUSCULAR | Status: AC
  Administered 2016-09-21: 30 mL

## 2016-09-21 MED ORDER — HYDROMORPHONE HCL 1 MG/ML IJ SOLN
INTRAMUSCULAR | Status: DC
Start: 2016-09-21 — End: 2016-09-22
  Filled 2016-09-21: qty 1

## 2016-09-21 MED ORDER — HYDROMORPHONE HCL 1 MG/ML IJ SOLN
1.0000 mg | Freq: Once | INTRAMUSCULAR | Status: AC
  Administered 2016-09-21: 1 mg via INTRAVENOUS
  Filled 2016-09-21: qty 1

## 2016-09-21 MED ORDER — PHENYLEPHRINE 200 MCG/ML FOR PRIAPISM / HYPOTENSION
100.0000 ug | Freq: Once | INTRAMUSCULAR | Status: DC
  Filled 2016-09-21 (×4): qty 50

## 2016-09-21 MED ORDER — OXYCODONE HCL 5 MG PO TABS: 5.0000 mg | ORAL_TABLET | Freq: Three times a day (TID) | ORAL | 0 refills | Status: DC | PRN

## 2016-09-21 MED ORDER — HYDROMORPHONE HCL 1 MG/ML IJ SOLN
INTRAMUSCULAR | Status: AC
  Filled 2016-09-21: qty 1

## 2016-09-21 MED ORDER — CLINDAMYCIN HCL 150 MG PO CAPS
300.0000 mg | ORAL_CAPSULE | Freq: Once | ORAL | Status: AC
  Administered 2016-09-22: 300 mg via ORAL
  Filled 2016-09-21: qty 2

## 2016-09-21 NOTE — ED Notes (Addendum)
Lock removed by MD Alphonzo Lemmingsmcshane

## 2016-09-21 NOTE — ED Notes (Signed)
Dr Alphonzo LemmingsMcShane at bedside attempting to remove lock - assisted by Assunta GamblesLaura C RN

## 2016-09-21 NOTE — ED Notes (Signed)
Report given to Kenny RN 

## 2016-09-21 NOTE — ED Notes (Signed)
Attempted to call locksmith at [phone]. Locksmith said he was at another job and was 30-35 away. States he would consult his partner and call us back.

## 2016-09-21 NOTE — ED Notes (Addendum)
Dr Alphonzo LemmingsMcShane at bedside and [facility] assiting

## 2016-09-21 NOTE — ED Notes (Signed)
MD and charge at bedside. 16 fr angio cath inserted into pt's penis by urologist to reduce swelling.

## 2016-09-21 NOTE — ED Notes (Signed)
Dr Alphonzo LemmingsMcShane and Dr Roxan Hockeyobinson at bedside - awaiting urologist and locksmith to arrive - Clinton SawyerKailey RN at beside to assist

## 2016-09-21 NOTE — ED Notes (Signed)
Ed provider at bedside explaining delay to pt.

## 2016-09-21 NOTE — ED Notes (Addendum)
Urologist at bedside with drill in attempt to drill hole into lock.

## 2016-09-21 NOTE — ED Notes (Signed)
Urologist at bedside.

## 2016-09-21 NOTE — Progress Notes (Signed)
Requested operator contact plant operations emergently

## 2016-09-21 NOTE — Discharge Instructions (Signed)
Obviously, we strongly advise you do not place anything on her penis that is constricting in the future.  you have had some soft tissue damage from the lock.  We advise that you refrain from sexual encounters or activity and that you follow up closely with urology. If you have fever, increased pain, swelling that seems to worsen, trouble voiding, bloody urine, or you have any other new or worrisome symptoms, return to the emergency department.

## 2016-09-21 NOTE — ED Notes (Signed)
Attempted to call locksmith at [phone] back, no answer.

## 2016-09-21 NOTE — Progress Notes (Signed)
Requested Shawn from plant operations to come to the emergency department

## 2016-09-21 NOTE — ED Triage Notes (Signed)
Pt reports was masturbating with a lock from a storage building and his penis is stuck in the lock. Pt broke the key off into the lock when trying to remove the lock.

## 2016-09-21 NOTE — ED Notes (Signed)
Dr Alphonzo LemmingsMcShane at bedside attempting to remove lock from penis - assisted by [facility]

## 2016-09-21 NOTE — ED Notes (Signed)
Dr. Alphonzo LemmingsMcShane is at pt's bedside. Pt has a lock that he placed around his penis around 30 minutes ago. Pt's key broke of inside of the lock. Pt was unable to get lock of his penis. Pt's penis is dark in color at this time.

## 2016-09-21 NOTE — ED Notes (Addendum)
Urologist at bedside. Urologist consults with MD Alphonzo LemmingsMcshane to wait for locksmith.

## 2016-09-21 NOTE — ED Notes (Addendum)
Dr Alphonzo LemmingsMcShane at bedside attempting to remove lock - given phone [phone] for consult calls to be rang to - assisted by Assunta GamblesLaura C RN

## 2016-09-21 NOTE — Consult Note (Signed)
09/21/2016 8:01 PM   Brian [name] 06/30/1970 [id number]  Referring provider: Dr. Madolyn FriezeJ. McShane  Chief Complaint  Patient presents with  . Lock stuck on penis    HPI: The patient is a 46 year old gentleman presented to the emergency department this evening with a key lock placed around his penis that he was using for masturbation. The lock constricted his penis to prevent it from draining making it impossible for the lack to be slid off. Unfortunately, the the key broke off with in the lock preventing removal of the device for which urology was consulted. The emergency room staff was able to move the lock from the proximal shaft of the midshaft however still unable to be removed.   PMH: Past Medical History:  Diagnosis Date  . ADHD   . Alcohol abuse   . Anxiety   . Asthma   . Depression   . Hypertension     Surgical History: Past Surgical History:  Procedure Laterality Date  . CHOLECYSTECTOMY  2004     Allergies:  Allergies  Allergen Reactions  . Acetaminophen Other (See Comments)    Other Reaction: Not Assessed  . Lithium Other (See Comments)    Emotional issues admitted after taking  . Sulfa Antibiotics Other (See Comments)    Other Reaction: Not Assessed  . Valproic Acid   . Depakote [Divalproex Sodium] Anxiety  . Penicillins Rash    Family History: No family history on file.  Social History:  reports that he has never smoked. He has never used smokeless tobacco. He reports that he drinks alcohol. He reports that he does not use drugs.  ROS: 12 point ROS negative except for above  Physical Exam: BP (!) 153/101 (BP Location: Right Arm)   Pulse (!) 101   Temp 98.2 F (36.8 C) (Oral)   Resp 18   Ht 6\' 1"  (1.854 m)   Wt 232 lb (105.2 kg)   SpO2 93%   BMI 30.61 kg/m   Constitutional:  Alert and oriented, No acute distress. HEENT: [place] AT, moist mucus membranes.  Trachea midline, no masses. Cardiovascular: No clubbing, cyanosis, or  edema. Respiratory: Normal respiratory effort, no increased work of breathing. GI: Abdomen is soft, nontender, nondistended, no abdominal masses GU: No CVA tenderness. Testicles are equal bilaterally. The storage shed Master lock is located approximately at the midshaft at this point. There is engorgement of the penis proximal to this. Skin: No rashes, bruises or suspicious lesions. Lymph: No cervical or inguinal adenopathy. Neurologic: Grossly intact, no focal deficits, moving all 4 extremities. Psychiatric: Normal mood and affect.  Laboratory Data: Lab Results  Component Value Date   WBC 7.4 04/07/2016   HGB 16.8 04/07/2016   HCT 48.1 04/07/2016   MCV 91.2 04/07/2016   PLT 136 (L) 04/07/2016    Lab Results  Component Value Date   CREATININE 0.69 04/07/2016    No results found for: PSA  No results found for: TESTOSTERONE  No results found for: HGBA1C  Urinalysis No results found for: COLORURINE, APPEARANCEUR, LABSPEC, PHURINE, GLUCOSEU, HGBUR, BILIRUBINUR, KETONESUR, PROTEINUR, UROBILINOGEN, NITRITE, LEUKOCYTESUR  Procedure: The patient underwent a penile block with 1% lidocaine without epinephrine on multiple occasions after prepping him with Betadine. He did undergo detumescence a few times during this lengthy procedure were still a few hours with a 16 [place] Angiocath needle. 200 g of phenylephrine were also injected via this Angiocath to help with detumescence as the lock was attempted to be removed. Attempts were made to  use to drill out the lock and key portion of the locking mechanism. Once this was completely drilled out, the lock still did not open even after trying to unlatch the locking mechanism. At this point, a dremel was used to cut through the actual bar of the lock in two locations. During this, the penis was protected with a malleable ribbon to avoid injury. The actual locking bar was cut on two side so that there was now an opening for the penis to able to be  freed. Upon freeing the penis, there was immediate return of penis to the distal portion of penis. He also had immediate release of urine per urethra. Procedure was performed in conjunction with Dr. Alphonzo Lemmings from Emergency Medicine.  Assessment & Plan:    1. Foreign body constricting the penis The lock was successfully removed. I expect for the patient's penis to have normal blood flow at this point. I did warn him that he is at risk for erectile dysfunction as result of ischemia. He will forward prior to discharge and a PVR was checked to ensure that he does not need a Foley catheter for constriction of his distal urethra. He'll be sent home on antibiotics and pain medications. He'll be sooner office for follow-up for a wound check in the next few weeks.   Hildred Laser, MD  [facility] [address] [place], [place] [address] [phone]

## 2016-09-21 NOTE — ED Provider Notes (Addendum)
[facility] Emergency Department Provider Note  ____________________________________________   I have reviewed the triage vital signs and the nursing notes.   HISTORY  Chief Complaint Lock stuck on penis    HPI Brian [name] is a 46 y.o. male  Presents today with a lock locked around his penis.He was using it for self pleasure.The key broke  in the lock and he could not get it off. He now has pain to the distal tip of the penis.happened perhaps 30 min pta has pain but no numbness.     Past Medical History:  Diagnosis Date  . ADHD   . Alcohol abuse   . Anxiety   . Asthma   . Depression   . Hypertension     There are no active problems to display for this patient.   Past Surgical History:  Procedure Laterality Date  . CHOLECYSTECTOMY  2004    Prior to Admission medications   Medication Sig Start Date End Date Taking? Authorizing Provider  albuterol (PROVENTIL HFA;VENTOLIN HFA) 108 (90 Base) MCG/ACT inhaler Inhale 1 puff into the lungs every 4 (four) hours as needed for wheezing or shortness of breath.    [provider]  Budesonide-Formoterol Fumarate (SYMBICORT IN) Inhale 1 puff into the lungs 2 (two) times daily.    [provider]  chlorhexidine (PERIDEX) 0.12 % solution 15 mLs by Mouth Rinse route 2 (two) times daily. 02/17/16   [provider]  clonazePAM (KLONOPIN) 1 MG tablet Take 1 tablet by mouth 3 (three) times daily as needed. 02/16/16   [provider]  Fluticasone-Salmeterol (ADVAIR DISKUS IN) Inhale 1 puff into the lungs 2 (two) times daily.    [provider]  methylphenidate (RITALIN) 20 MG tablet Take 1 tablet by mouth 3 (three) times daily. 01/20/16   [provider]  ondansetron (ZOFRAN ODT) 4 MG disintegrating tablet Take 1 tablet (4 mg total) by mouth every 8 (eight) hours as needed for nausea or vomiting. 04/07/16   Emily Filbert, MD  oxyCODONE (ROXICODONE) 5 MG  immediate release tablet Take 1 tablet (5 mg total) by mouth every 8 (eight) hours as needed. 04/07/16 04/07/17  Emily Filbert, MD  sertraline (ZOLOFT) 100 MG tablet Take 100 mg by mouth 2 (two) times daily. 02/16/16   [provider]    Allergies Acetaminophen; Lithium; Sulfa antibiotics; Valproic acid; Depakote [divalproex sodium]; and Penicillins  No family history on file.  Social History Social History  Substance Use Topics  . Smoking status: Never Smoker  . Smokeless tobacco: Never Used  . Alcohol use Yes     Comment: drinks a 5th a day    Review of Systems Constitutional: No fever/chills Eyes: No visual changes. ENT: No sore throat. No stiff neck no neck pain Cardiovascular: Denies chest pain. Respiratory: Denies shortness of breath. Gastrointestinal:   no vomiting.  No diarrhea.  No constipation. Genitourinary: Negative for dysuria. Musculoskeletal: Negative lower extremity swelling Skin: Negative for rash. Neurological: Negative for severe headaches, focal weakness or numbness.   ____________________________________________   PHYSICAL EXAM:  VITAL SIGNS: ED Triage Vitals  Enc Vitals Group     BP 09/21/16 1711 (!) 170/117     Pulse Rate 09/21/16 1711 (!) 122     Resp 09/21/16 1711 18     Temp 09/21/16 1711 98.2 F (36.8 C)     Temp Source 09/21/16 1711 Oral     SpO2 09/21/16 1711 96 %     Weight 09/21/16  1709 232 lb (105.2 kg)     Height 09/21/16 1709 6\' 1"  (1.854 m)     Head Circumference --      Peak Flow --      Pain Score 09/21/16 1709 5     Pain Loc --      Pain Edu? --      Excl. in [place]? --     Constitutional: Alert and oriented. Well appearing and in no acute distress. Eyes: Conjunctivae are normal Head: Atraumatic HEENT: No congestion/rhinnorhea. Mucous membranes are moist.  Oropharynx non-erythematous Neck:   Nontender with no meningismus, no masses, no stridor Cardiovascular: Normal rate, regular rhythm. Grossly normal heart  sounds.  Good peripheral circulation. Respiratory: Normal respiratory effort.  No retractions. Lungs CTAB. Abdominal: Soft and nontender. No distention. No guarding no rebound Back:  There is no focal tenderness or step off.  there is no midline tenderness there are no lesions noted. there is no CVA tenderness GU: Patient has a head lock flush against his abdominal wall t the base of his penis. At isa round padlock. He penis itself is engorged and purple, warm to touch Musculoskeletal: No lower extremity tenderness, no upper extremity tenderness. No joint effusions, no DVT signs strong distal pulses no edema Neurologic:  Normal speech and language. No gross focal neurologic deficits are appreciated.  Skin:  Skin is warm, dry and intact. No rash noted. Psychiatric: Mood and affect are normal. Speech and behavior are normal.  ____________________________________________   LABS (all labs ordered are listed, but only abnormal results are displayed)  Labs Reviewed - No data to display ____________________________________________  EKG  I personally interpreted any EKGs ordered by me or triage  ____________________________________________  RADIOLOGY  I reviewed any imaging ordered by me or triage that were performed during my shift and, if possible, patient and/or family made aware of any abnormal findings. ____________________________________________   PROCEDURES  Procedure(s) performed:   Procedure: Penile detumescence using Angiocath, this was performed four different times by me. Sterile prep with Betadine, patient consent each time, timeout each time, 16-gauge Angiocath placed at 9:00 and 3:00, blood withdrawn using  Syringe,no complications each time, patient tolerated each time well.lidocaine without epi employed using a  27-gauge needle  Procedure: penile block, 7 cc of lidocaine without epi were used in the base of the penis is a penile block  Procedure: Attempted to manually  remove the penis using string ligature multiple times      Procedures  Critical Care performed: CRITICAL CARE Performed by: Jeanmarie Plant   Total critical care time: 120  minutes  Critical care time was exclusive of separately billable procedures and treating other patients.  Critical care was necessary to treat or prevent imminent or life-threatening deterioration.  Critical care was time spent personally by me on the following activities: development of treatment plan with patient and/or surrogate as well as nursing, discussions with consultants, evaluation of patient's response to treatment, examination of patient, obtaining history from patient or surrogate, ordering and performing treatments and interventions, ordering and review of laboratory studies, ordering and review of radiographic studies, pulse oximetry and re-evaluation of patient's condition.   ____________________________________________   INITIAL IMPRESSION / ASSESSMENT AND PLAN / ED COURSE  Pertinent labs & imaging results that were available during my care of the patient were reviewed by me and considered in my medical decision making (see chart for details).     ----------------------------------------- 5:37 PM on 09/21/2016 -----------------------------------------  D/w dr Kary Kos who  advises demuescence   ----------------------------------------- 7:54 PM on 09/21/2016 -----------------------------------------  Have been working diligently try to remove lock  1. Attempted vasaline and gentle traction with no results 2. Attempted ring cutter but it is not big enough to cut through 3. Attempted stryker saw, but it cannot cut 4. After pt consent I did use lidocaine without epi and placed two angiocaths at 9 and 3 on the penis and drained out significant blood. However, was not able to remove the catheter.  Penis still perfused and less swollen angiocaths removed 5. Tried using string as a binding  ligature to remove and did manage to get lock to mid shaft. No further. 6. Urology at bedside 7. Have called every lock smith in the area, 2 are apparently coming  8. Pain well controlled with pain meds 9. Awaiting lock smith. I have offered to try to cut it with drimmel with metal tip but pt declines at this time. Risk benefit alternative of doing it and declining to do it explained.  10. tdap given 11. Did perform penis block with lidocaine  ----------------------------------------- 8:29 PM on 09/21/2016 -----------------------------------------  No locksmiths have come despite assurances. 2 different locksmiths have assured [place] they were coming directly only to fail to show up.  Urology at bedside. Pt continues to decline to have me try the drimmel, understands r/b/a of refusal.   ----------------------------------------- 9:41 PM on 09/21/2016 ----------------------------------------- Urologist dr. Sherryl Barters is drilling the lock in hopes of getting it off. If that does not work we will try to drimmel it off.  Urology has decompressed the penis several times. We are also keeping pt pain controlled with dilaudid. hve used phenylephrine as well.  Penis is warm and perfused still. This is a disc security loc with no brand name.   ----------------------------------------- 10:42 PM on 09/21/2016 -----------------------------------------  Pt ultimately agreed to let me drimmel it.  Urology did continually decompress the penis using 16 gauge angiocaths with sterile prep.   Using a metal malleable ribbon underneath to protect the penis we were able to drimmel through the bolt just past where it inserts into the lock but it did not free. Then we drimmeled through the bolt in 2 other places and were thus able to free the penis. No injury to the penis was sustained during these procedures.  Pt tolerated well. The penis was freed at 1030 after uninterrupted attention from me and several nurses and  urology for several hours. Penis pinked up immedately after its release.   Urology recommends ensuring pt can void, if he cannot we will place a catheter and have him f/u.  Keflex and pain medication also suggested.   Pt in nad and very grateful and relieved.  Urology feels that there is a good prognisis for the penis.   Counseled the patient not to do that again.   We did have to go to [employer] to get drimmel parts for the metal cutting. Went through multiple blades.   ----------------------------------------- 11:03 PM on 09/21/2016 -----------------------------------------  Pt was angry with the nurse that he was not cleaned up fast enough after the procedure. This was after nearly 7 hours of combined physician time trying to free his penis, as well as trips to [employer], and multiple nurses from the ED and OR nurses all assisting for hours etc. We did explain that we were doing our best to get him cleaned as rapidly as possible in a way to make him feel that he was receiving our attention.  He was also upset that the locksmith did not come in. I explained that we do not have locksmiths on call.    I did do my best to keep him treated with dilaudid. He says that med is strong but he is used to it.   Signed out at the end of my shift to dr. Zenda AlpersWebster who will f/u on his ability to void.       ____________________________________________   FINAL CLINICAL IMPRESSION(S) / ED DIAGNOSES  Final diagnoses:  None      This chart was dictated using voice recognition software.  Despite best efforts to proofread,  errors can occur which can change meaning.      Jeanmarie PlantMcShane, Jamiracle Avants A, MD 09/21/16 2005    Jeanmarie PlantMcShane, Quianna Avery A, MD 09/21/16 2031    Jeanmarie PlantMcShane, Leeland Lovelady A, MD 09/21/16 2146    Jeanmarie PlantMcShane, Rhoda Waldvogel A, MD 09/21/16 2250    Jeanmarie PlantMcShane, Daaiel Starlin A, MD 09/21/16 2253    Jeanmarie PlantMcShane, Lashia Niese A, MD 09/21/16 2308    Jeanmarie PlantMcShane, Emelio Schneller A, MD 09/21/16 [phone]

## 2016-09-21 NOTE — ED Notes (Signed)
Dr. Alphonzo LemmingsMcShane spoke with lock smith on phone. Chase PicketLock smith reports he will come as soon as possible.

## 2016-09-22 MED ORDER — OXYCODONE HCL 5 MG PO TABS
10.0000 mg | ORAL_TABLET | Freq: Once | ORAL | Status: AC
  Administered 2016-09-22: 10 mg via ORAL
  Filled 2016-09-22: qty 2

## 2016-09-22 NOTE — ED Notes (Signed)
Pt voided .  Bladder scanner showed left in bladder.

## 2016-09-22 NOTE — ED Notes (Signed)

## 2016-09-22 NOTE — ED Provider Notes (Signed)
-----------------------------------------   12:21 AM on 09/22/2016 -----------------------------------------   Blood pressure (!) 162/94, pulse 98, temperature 98.2 F (36.8 C), temperature source Oral, resp. rate 18, height 6\' 1"  (1.854 m), weight 105.2 kg (232 lb), SpO2 98 %.  Assuming care from Dr. Alphonzo LemmingsMcShane.  In short, Brian [name] is a 46 y.o. male with a chief complaint of Lock stuck on penis .  Refer to the original H&P for additional details.  The current plan of care is to wait for the patient to urinate.  The patient was able to urinate approximately 200 ML's. He had a postvoid residual of 175. I will give him a dose of oxycodone in the emergency department and he'll be discharged to follow-up with urology.      Rebecka ApleyWebster, Shaleah Nissley P, MD 09/22/16 [phone]

## 2016-09-27 ENCOUNTER — Telehealth: Payer: Self-pay | Admitting: Urology

## 2016-09-27 ENCOUNTER — Other Ambulatory Visit: Payer: Self-pay

## 2016-09-27 DIAGNOSIS — S3994XS Unspecified injury of external genitals, sequela: Secondary | ICD-10-CM

## 2016-09-27 MED ORDER — OXYCODONE HCL 5 MG PO TABS: 5.0000 mg | ORAL_TABLET | Freq: Three times a day (TID) | ORAL | 0 refills | Status: AC | PRN

## 2016-09-27 NOTE — Telephone Encounter (Signed)
done

## 2016-09-27 NOTE — Telephone Encounter (Signed)
Patient is asking for a refill on pain medication.  Brian DusterMichelle  Please call him back at [phone]

## 2016-09-27 NOTE — Telephone Encounter (Signed)
-----   Message from Hildred LaserBrian James Budzyn, MD sent at 09/22/2016 11:17 AM EDT ----- Patient needs to see me in 2 weeks for wound check. Thanks.

## 2016-10-09 ENCOUNTER — Telehealth: Payer: Self-pay

## 2016-10-09 NOTE — Telephone Encounter (Signed)
LMOM Checking on pain level since calling after hours triage line over the weekend.

## 2016-10-10 NOTE — Telephone Encounter (Signed)
LMOM

## 2016-10-11 NOTE — Telephone Encounter (Signed)
Not able to get in touch with pt. 

## 2016-10-12 ENCOUNTER — Encounter: Payer: Self-pay | Admitting: Urology

## 2016-10-12 ENCOUNTER — Ambulatory Visit: Payer: Self-pay | Admitting: Urology

## 2017-04-05 DIAGNOSIS — I10 Essential (primary) hypertension: Secondary | ICD-10-CM | POA: Insufficient documentation

## 2017-07-23 DIAGNOSIS — Z765 Malingerer [conscious simulation]: Secondary | ICD-10-CM | POA: Insufficient documentation

## 2017-12-29 DIAGNOSIS — E781 Pure hyperglyceridemia: Secondary | ICD-10-CM | POA: Insufficient documentation

## 2018-05-31 IMAGING — CR DG CHEST 2V
2 series · 2 of 2 positions shown · non-contrast
Comparison: 12/20/2015

CLINICAL DATA: Palpitations

EXAM:
CHEST  2 VIEW

[chest lat]
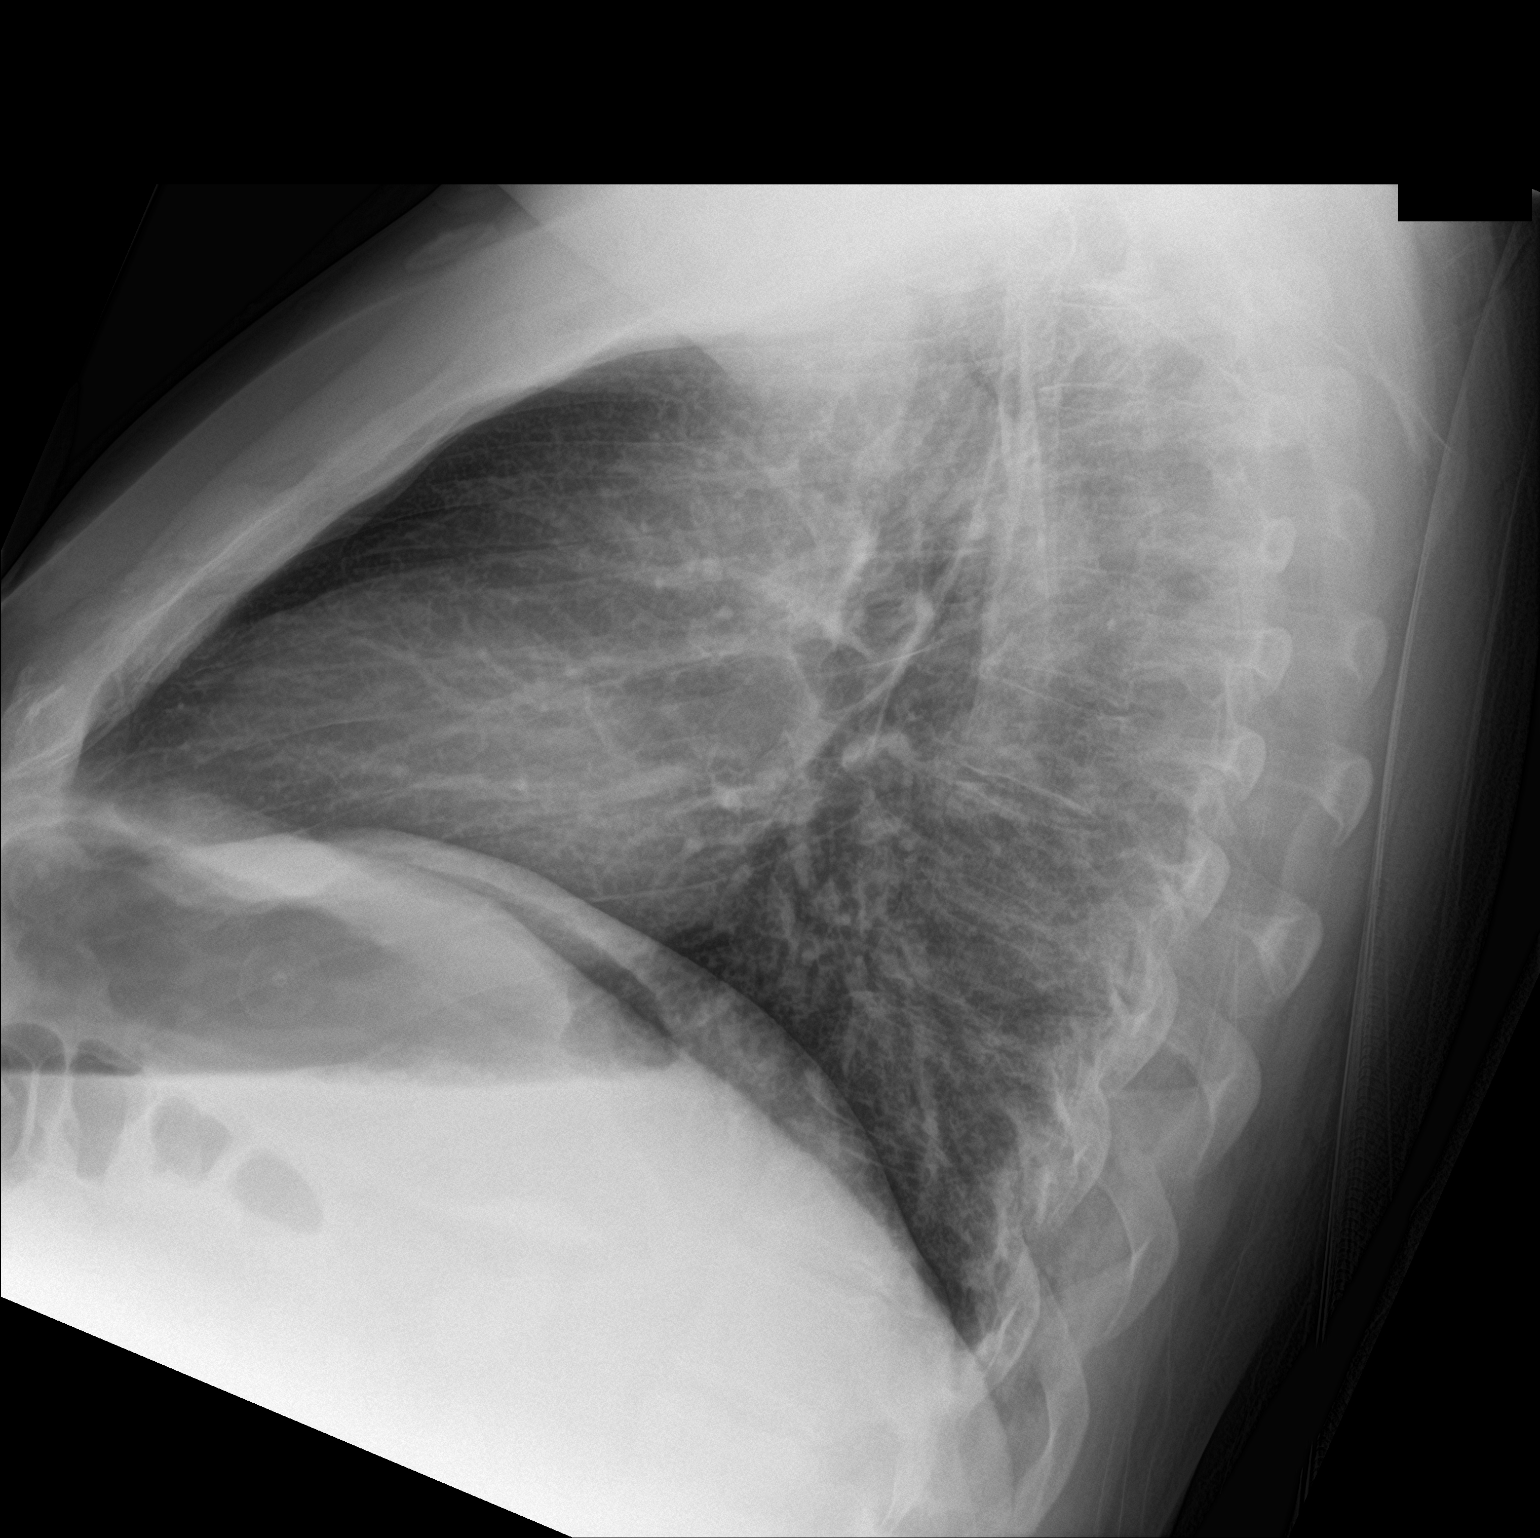

[chest ap]
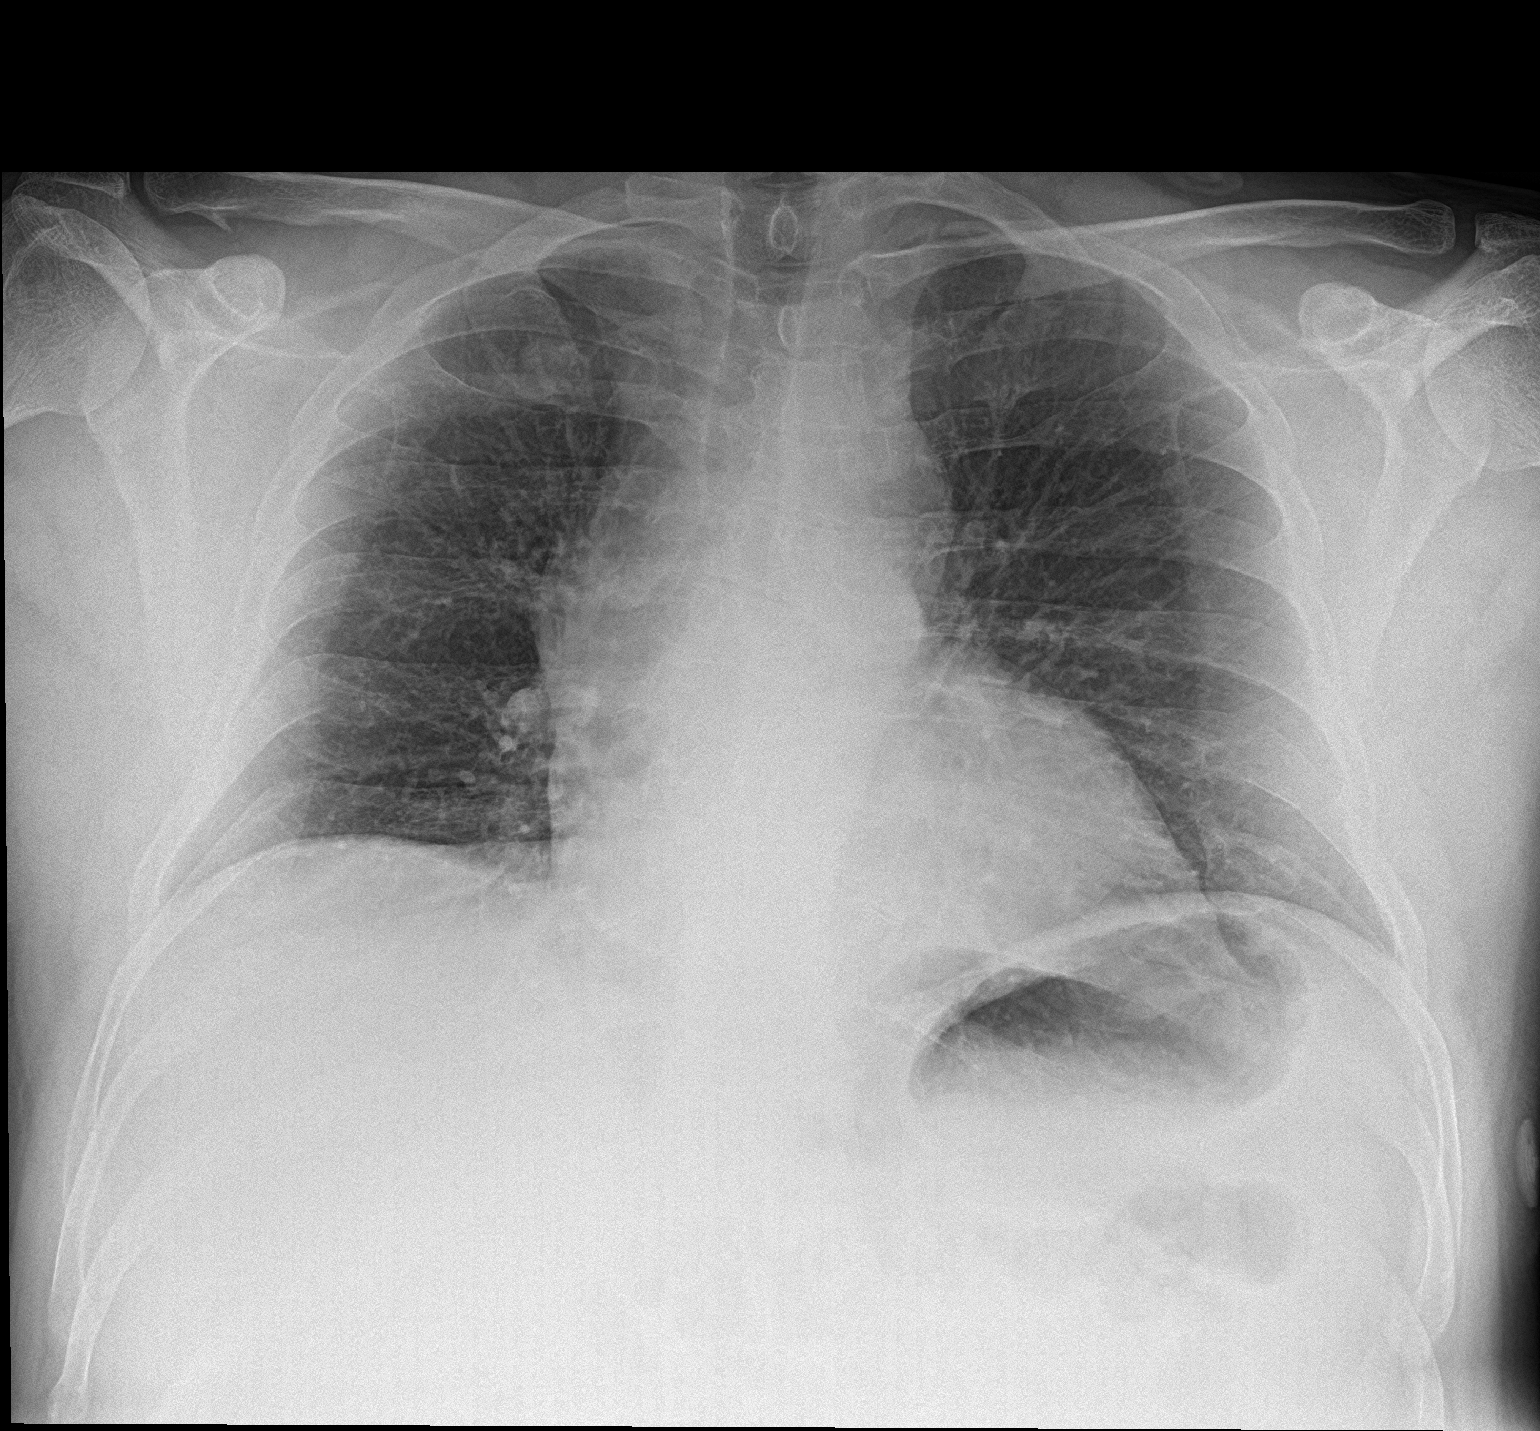

[2 of 2 positions shown; findings below may reference images not displayed]

FINDINGS: The heart size and mediastinal contours are within normal limits.
Both lungs are clear. The visualized skeletal structures are
unremarkable.
IMPRESSION: No active cardiopulmonary disease.

## 2018-06-23 IMAGING — CR DG ABDOMEN 2V
3 series · 3 of 3 positions shown · non-contrast
Comparison: 11/09/2015

CLINICAL DATA: Mid abdominal pain for 3 hours

EXAM:
ABDOMEN - 2 VIEW

[abdomen erect]
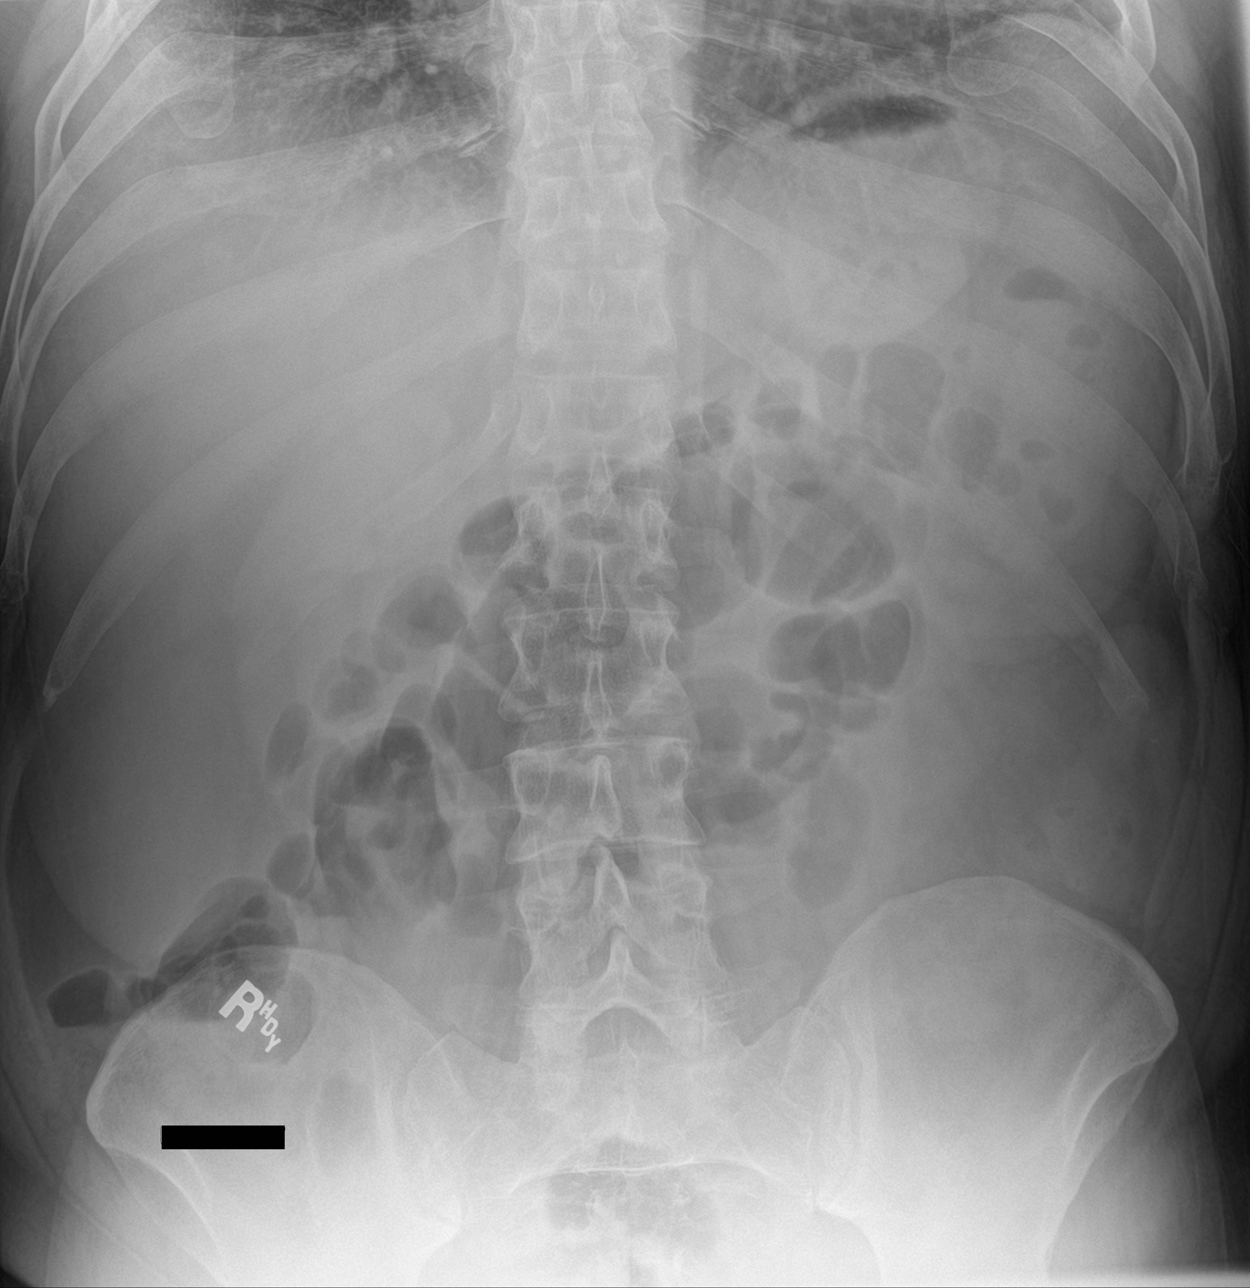

[abdomen supine (1 of 2)]
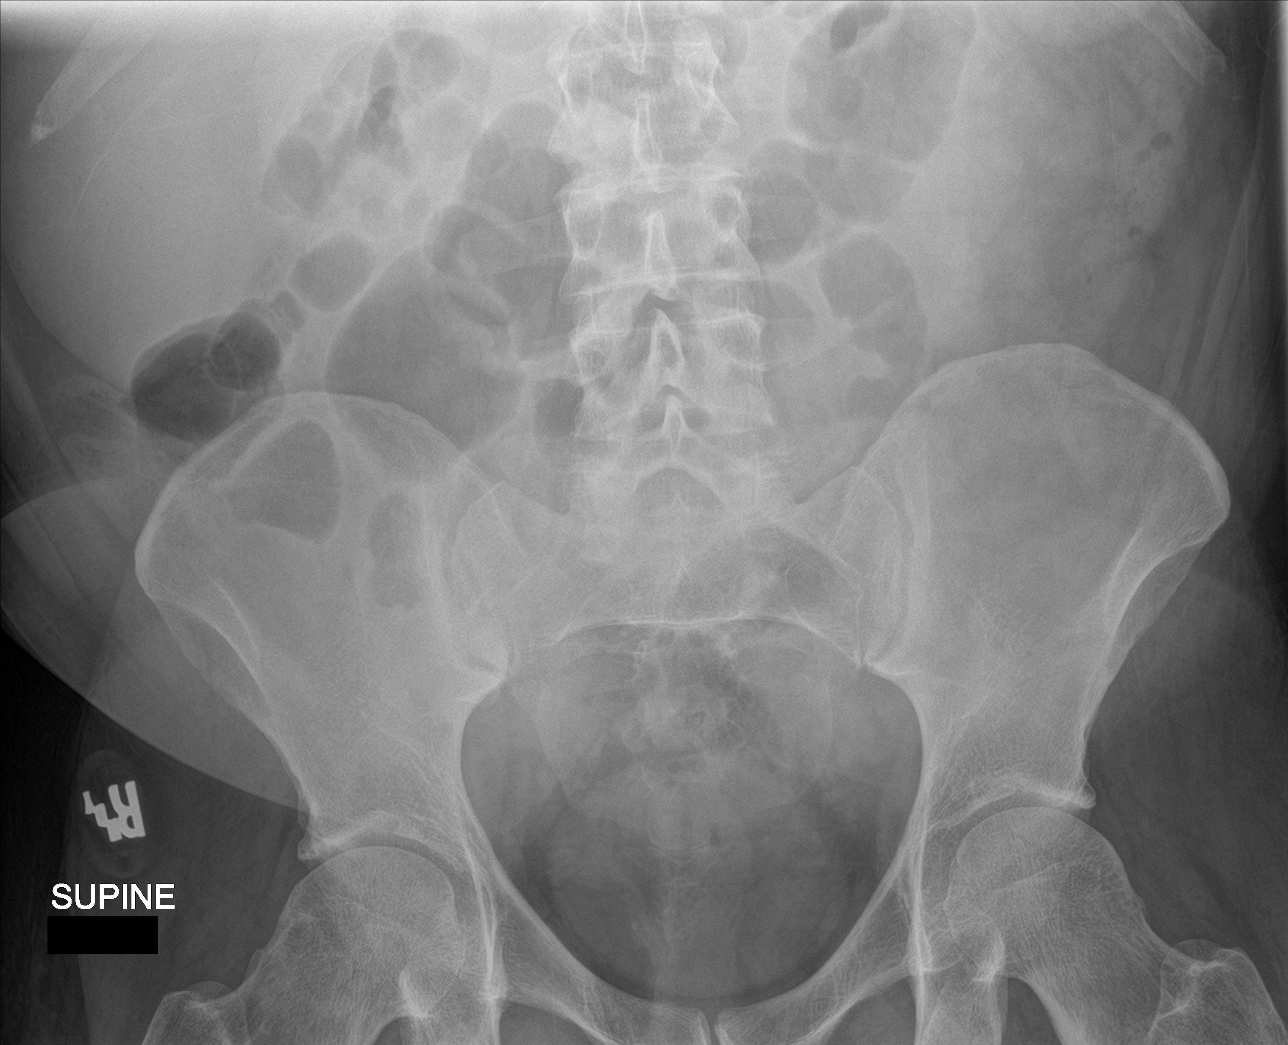

[abdomen supine (2 of 2)]
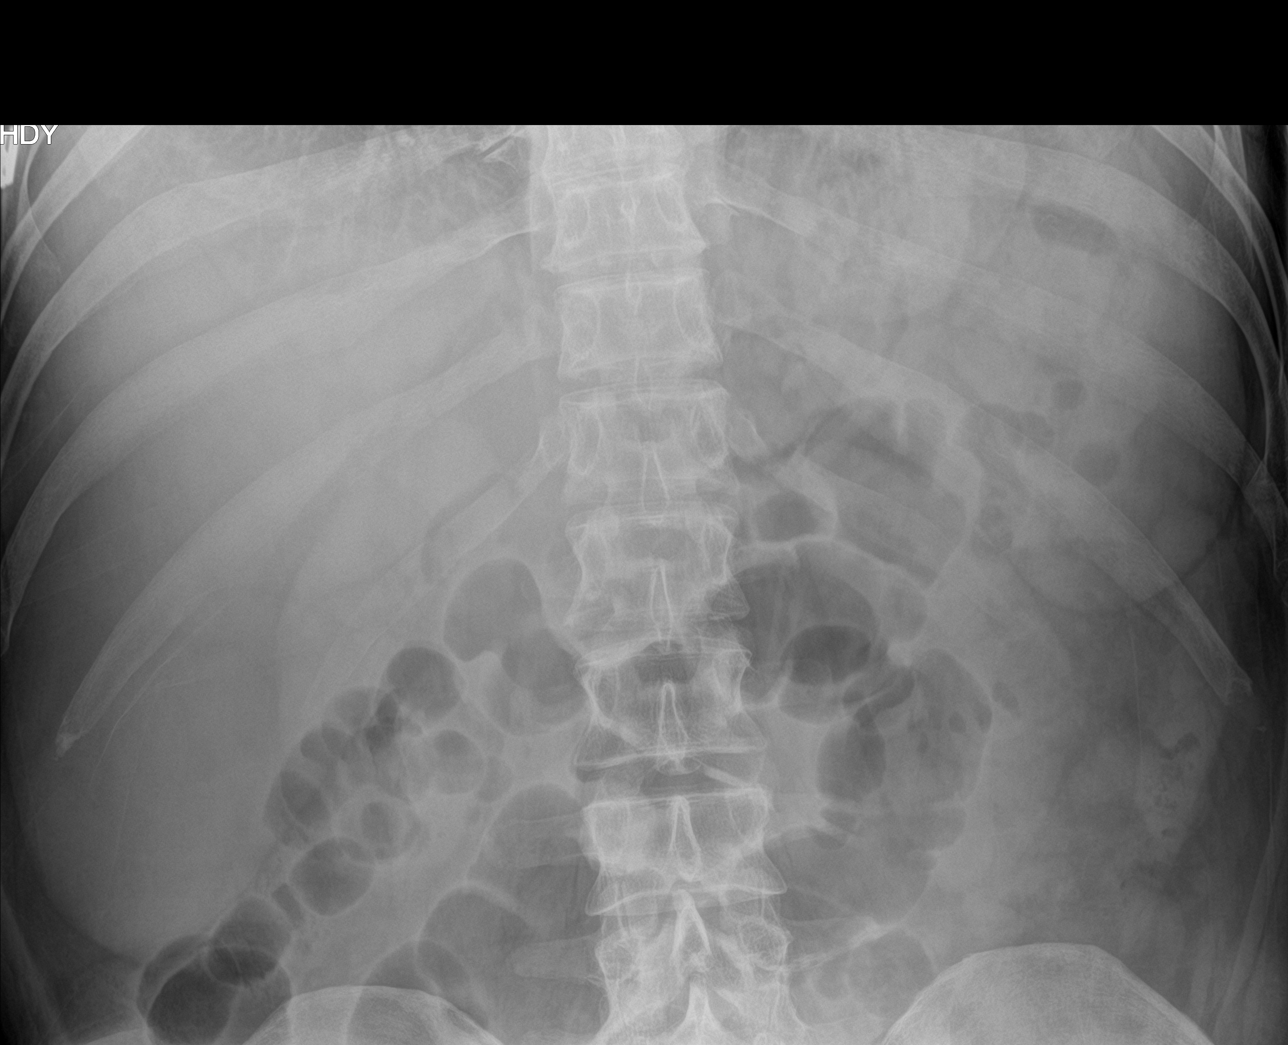

[3 of 3 positions shown; findings below may reference images not displayed]

FINDINGS: Liver and spleen both appear prominent. Recommend clinical
correlation. No evidence of bowel obstruction. No free air or
suspicious calcification.
IMPRESSION: Possible hepatosplenomegaly.  Recommend clinical correlation.

## 2019-11-21 DIAGNOSIS — D696 Thrombocytopenia, unspecified: Secondary | ICD-10-CM | POA: Insufficient documentation

## 2020-02-25 DIAGNOSIS — I7121 Aneurysm of the ascending aorta, without rupture: Secondary | ICD-10-CM | POA: Insufficient documentation

## 2020-02-25 DIAGNOSIS — Z952 Presence of prosthetic heart valve: Secondary | ICD-10-CM | POA: Insufficient documentation

## 2020-02-25 HISTORY — PX: OTHER SURGICAL HISTORY: SHX169

## 2020-03-04 DIAGNOSIS — R456 Violent behavior: Secondary | ICD-10-CM | POA: Insufficient documentation

## 2020-03-10 DIAGNOSIS — Z86711 Personal history of pulmonary embolism: Secondary | ICD-10-CM | POA: Insufficient documentation

## 2020-03-10 DIAGNOSIS — I719 Aortic aneurysm of unspecified site, without rupture: Secondary | ICD-10-CM | POA: Insufficient documentation

## 2020-03-10 DIAGNOSIS — J4520 Mild intermittent asthma, uncomplicated: Secondary | ICD-10-CM | POA: Insufficient documentation

## 2020-04-28 DIAGNOSIS — R002 Palpitations: Secondary | ICD-10-CM | POA: Insufficient documentation

## 2020-04-29 DIAGNOSIS — I951 Orthostatic hypotension: Secondary | ICD-10-CM | POA: Insufficient documentation

## 2020-10-14 DIAGNOSIS — Z7901 Long term (current) use of anticoagulants: Secondary | ICD-10-CM | POA: Insufficient documentation

## 2021-07-08 DIAGNOSIS — Z9889 Other specified postprocedural states: Secondary | ICD-10-CM | POA: Insufficient documentation

## 2021-11-04 ENCOUNTER — Telehealth: Payer: Self-pay | Admitting: Emergency Medicine

## 2021-11-04 ENCOUNTER — Emergency Department: Payer: Medicaid Other

## 2021-11-04 ENCOUNTER — Emergency Department
Admission: EM | Admit: 2021-11-04 | Discharge: 2021-11-04 | Disposition: A | Discharge status: Home / Self Care | Payer: Medicaid Other | Attending: Emergency Medicine | Admitting: Emergency Medicine

## 2021-11-04 DIAGNOSIS — S3992XA Unspecified injury of lower back, initial encounter: Secondary | ICD-10-CM | POA: Diagnosis present

## 2021-11-04 DIAGNOSIS — Y92039 Unspecified place in apartment as the place of occurrence of the external cause: Secondary | ICD-10-CM | POA: Diagnosis not present

## 2021-11-04 DIAGNOSIS — I7121 Aneurysm of the ascending aorta, without rupture: Secondary | ICD-10-CM | POA: Diagnosis not present

## 2021-11-04 DIAGNOSIS — W010XXA Fall on same level from slipping, tripping and stumbling without subsequent striking against object, initial encounter: Secondary | ICD-10-CM | POA: Diagnosis not present

## 2021-11-04 DIAGNOSIS — Z7901 Long term (current) use of anticoagulants: Secondary | ICD-10-CM | POA: Insufficient documentation

## 2021-11-04 DIAGNOSIS — J45909 Unspecified asthma, uncomplicated: Secondary | ICD-10-CM | POA: Insufficient documentation

## 2021-11-04 DIAGNOSIS — Z1152 Encounter for screening for COVID-19: Secondary | ICD-10-CM | POA: Diagnosis not present

## 2021-11-04 DIAGNOSIS — Y900 Blood alcohol level of less than 20 mg/100 ml: Secondary | ICD-10-CM | POA: Insufficient documentation

## 2021-11-04 DIAGNOSIS — S22080A Wedge compression fracture of T11-T12 vertebra, initial encounter for closed fracture: Secondary | ICD-10-CM | POA: Diagnosis not present

## 2021-11-04 DIAGNOSIS — W19XXXA Unspecified fall, initial encounter: Secondary | ICD-10-CM

## 2021-11-04 LAB — COMPREHENSIVE METABOLIC PANEL
ALT: 44 U/L (ref 0–44)
AST: 88 U/L — ABNORMAL HIGH (ref 15–41)
Albumin: 5 g/dL (ref 3.5–5.0)
Alkaline Phosphatase: 69 U/L (ref 38–126)
Anion gap: 20 — ABNORMAL HIGH (ref 5–15)
BUN: 13 mg/dL (ref 6–20)
CO2: 24 mmol/L (ref 22–32)
Calcium: 9 mg/dL (ref 8.9–10.3)
Chloride: 92 mmol/L — ABNORMAL LOW (ref 98–111)
Creatinine, Ser: 1.01 mg/dL (ref 0.61–1.24)
GFR, Estimated: >60 mL/min (ref >60)
Glucose, Bld: 90 mg/dL (ref 70–99)
Potassium: 3.6 mmol/L (ref 3.5–5.1)
Sodium: 136 mmol/L (ref 135–145)
Total Bilirubin: 1.7 mg/dL — ABNORMAL HIGH (ref 0.3–1.2)
Total Protein: 9.1 g/dL — ABNORMAL HIGH (ref 6.5–8.1)

## 2021-11-04 LAB — SARS CORONAVIRUS 2 BY RT PCR: SARS Coronavirus 2 by RT PCR: NEGATIVE

## 2021-11-04 LAB — CBC WITH DIFFERENTIAL/PLATELET
Abs Immature Granulocytes: 0.02 10*3/uL (ref 0.00–0.07)
Basophils Absolute: 0.1 10*3/uL (ref 0.0–0.1)
Basophils Relative: 1 %
Eosinophils Absolute: 0.2 10*3/uL (ref 0.0–0.5)
Eosinophils Relative: 2 %
HCT: 46.8 % (ref 39.0–52.0)
Hemoglobin: 16.3 g/dL (ref 13.0–17.0)
Immature Granulocytes: 0 %
Lymphocytes Relative: 28 %
Lymphs Abs: 2.3 10*3/uL (ref 0.7–4.0)
MCH: 30.6 pg (ref 26.0–34.0)
MCHC: 34.8 g/dL (ref 30.0–36.0)
MCV: 87.8 fL (ref 80.0–100.0)
Monocytes Absolute: 0.4 10*3/uL (ref 0.1–1.0)
Monocytes Relative: 5 %
Neutro Abs: 5.2 10*3/uL (ref 1.7–7.7)
Neutrophils Relative %: 64 %
Platelets: 177 10*3/uL (ref 150–400)
RBC: 5.33 MIL/uL (ref 4.22–5.81)
RDW: 12.4 % (ref 11.5–15.5)
WBC: 8.2 10*3/uL (ref 4.0–10.5)
nRBC: 0 % (ref 0.0–0.2)

## 2021-11-04 LAB — URINALYSIS, ROUTINE W REFLEX MICROSCOPIC
Bacteria, UA: NONE SEEN
Bilirubin Urine: NEGATIVE
Glucose, UA: NEGATIVE mg/dL
Ketones, ur: 20 mg/dL — AB
Leukocytes,Ua: NEGATIVE
Nitrite: NEGATIVE
Protein, ur: 30 mg/dL — AB
Specific Gravity, Urine: 1.016 (ref 1.005–1.030)
pH: 5 (ref 5.0–8.0)

## 2021-11-04 LAB — TROPONIN I (HIGH SENSITIVITY)
Troponin I (High Sensitivity): 27 ng/L — ABNORMAL HIGH (ref <18)
Troponin I (High Sensitivity): 16 ng/L (ref <18)

## 2021-11-04 LAB — ETHANOL: Alcohol, Ethyl (B): <10 mg/dL (ref <10)

## 2021-11-04 LAB — LIPASE, BLOOD: Lipase: 28 U/L (ref 11–51)

## 2021-11-04 LAB — AMMONIA: Ammonia: 39 umol/L — ABNORMAL HIGH (ref 9–35)

## 2021-11-04 LAB — D-DIMER, QUANTITATIVE: D-Dimer, Quant: 3.2 ug/mL-FEU — ABNORMAL HIGH (ref 0.00–0.50)

## 2021-11-04 MED ORDER — OXYCODONE HCL 5 MG PO TABS: 5.0000 mg | ORAL_TABLET | Freq: Four times a day (QID) | ORAL | 0 refills | Status: DC | PRN

## 2021-11-04 MED ORDER — LIDOCAINE 5 % EX PTCH
1.0000 | MEDICATED_PATCH | Freq: Once | CUTANEOUS | Status: DC
  Administered 2021-11-04: 1 via TRANSDERMAL
  Filled 2021-11-04: qty 1

## 2021-11-04 MED ORDER — ONDANSETRON HCL 4 MG/2ML IJ SOLN
4.0000 mg | Freq: Once | INTRAMUSCULAR | Status: AC
  Administered 2021-11-04: 4 mg via INTRAVENOUS
  Filled 2021-11-04: qty 2

## 2021-11-04 MED ORDER — IOHEXOL 350 MG/ML SOLN
100.0000 mL | Freq: Once | INTRAVENOUS | Status: AC | PRN
  Administered 2021-11-04: 100 mL via INTRAVENOUS

## 2021-11-04 MED ORDER — OXYCODONE HCL 5 MG PO TABS
5.0000 mg | ORAL_TABLET | Freq: Once | ORAL | Status: AC
  Administered 2021-11-04: 5 mg via ORAL
  Filled 2021-11-04: qty 1

## 2021-11-04 MED ORDER — HYDROMORPHONE HCL 1 MG/ML IJ SOLN
0.5000 mg | Freq: Once | INTRAMUSCULAR | Status: AC
  Administered 2021-11-04: 0.5 mg via INTRAVENOUS
  Filled 2021-11-04: qty 0.5

## 2021-11-04 NOTE — ED Triage Notes (Signed)
Reports fall at home landing on back. Now reports back pain. Pt unable to state whether fall was mechanical or syncopal. Denies chest pain. Pt alert and oriented. Ambulatory in triage.

## 2021-11-04 NOTE — ED Provider Notes (Signed)
[facility] Provider Note    Event Date/Time   First MD Initiated Contact with Patient 11/04/21 0932     (approximate)   History   Fall and Back Pain   HPI  Brian [name] is a 51 y.o. male with history of EtOH abuse, pancreatitis, asthma, PE on Eliquis, ascending aortic aneurysm status post aortic root replacement who comes in from home where patient fell and landed on his back.  There is initially a report of not being sure if it was syncopal versus mechanical in nature.  Patient does report that he thinks that he slipped on something.  He reports that he wears slippers and that in combination of moving into a new apartment that has a fresh paint smell.  He does report falling back and hitting his back.  He states that he did drink a little bit of alcohol but he has been trying to cut down significantly.  He denies any chest pain or shortness of breath.  He is unsure if he had LOC or not.   Physical Exam   Triage Vital Signs: ED Triage Vitals  Enc Vitals Group     BP 11/04/21 0319 (!) 138/98     Pulse Rate 11/04/21 0319 86     Resp 11/04/21 0319 18     Temp 11/04/21 0319 98.2 F (36.8 C)     Temp Source 11/04/21 0319 Oral     SpO2 11/04/21 0319 93 %     Weight 11/04/21 0315 240 lb (108.9 kg)     Height 11/04/21 0315 6\' 1"  (1.854 m)     Head Circumference --      Peak Flow --      Pain Score 11/04/21 0316 6     Pain Loc --      Pain Edu? --      Excl. in [place]? --     Most recent vital signs: Vitals:   11/04/21 0319  BP: (!) 138/98  Pulse: 86  Resp: 18  Temp: 98.2 F (36.8 C)  SpO2: 93%     General: Awake, no distress.  CV:  Good peripheral perfusion.  Resp:  Normal effort.  Abd:  No distention.  Soft and nontender Other:  Equal strength in arms and legs.  Sensation intact.   ED Results / Procedures / Treatments   Labs (all labs ordered are listed, but only abnormal results are displayed) Labs Reviewed  COMPREHENSIVE  METABOLIC PANEL - Abnormal; Notable for the following components:      Result Value   Chloride 92 (*)    Total Protein 9.1 (*)    AST 88 (*)    Total Bilirubin 1.7 (*)    Anion gap 20 (*)    All other components within normal limits  URINALYSIS, ROUTINE W REFLEX MICROSCOPIC - Abnormal; Notable for the following components:   Color, Urine YELLOW (*)    APPearance HAZY (*)    Hgb urine dipstick SMALL (*)    Ketones, ur 20 (*)    Protein, ur 30 (*)    All other components within normal limits  D-DIMER, QUANTITATIVE - Abnormal; Notable for the following components:   D-Dimer, Quant 3.20 (*)    All other components within normal limits  TROPONIN I (HIGH SENSITIVITY) - Abnormal; Notable for the following components:   Troponin I (High Sensitivity) 27 (*)    All other components within normal limits  CBC WITH DIFFERENTIAL/PLATELET  TROPONIN I (HIGH SENSITIVITY)  EKG  My interpretation of EKG:  Normal sinus rate of 83 without any ST elevation or T wave inversions except for V6 and aVL, normal intervals  RADIOLOGY I have reviewed the CT head personally interpreted no evidence of intracranial hemorrhage  PROCEDURES:  Critical Care performed: No  Procedures   MEDICATIONS ORDERED IN ED: Medications - No data to display   IMPRESSION / MDM / ASSESSMENT AND PLAN / ED COURSE  I reviewed the triage vital signs and the nursing notes.   Patient's presentation is most consistent with acute presentation with potential threat to life or bodily function.   Differential from fall includes intracranial hemorrhage, cervical fracture.  D-dimer was elevated from triage so CT PE will be ordered as well as CT abdomen pelvis.  We will get some reformats given the concern for thoracic fracture on x-ray just to ensure that is not actually worse than the x-ray shows.  We will give 1 dose of IV Dilaudid while pending results.  CT head negative Ct cervical negative Chest xray negative   Thoracic xray t12 compression fracture   + ddimer  Cbc normal  Cmp low Cl AST slightly elevatedc  Trop + however upon repeat is downtrending. Ammonia is slightly elevated but patient is not altered and does not have any cirrhosis noted on CT imaging. Urine denies any symptoms.  CT imaging is overall reassuring other than the new T12 vertebral body compression deformity.  Patient has no evidence of cord compression.  We discussed admission if he thought this was more syncopal versus going home.  Patient's been in the ER for 10 hours due to prolonged ER wait times.  Here he does not think that he actually had syncope and thinks it was more of a slip and fall.  He feels comfortable following up outpatient with cardiology.  We will order TLSO brace outpatient follow-up with neurosurgery outpatient.  We discussed oxycodone for pain and not drinking alcohol with it due to the risk for sedation.  He expressed understanding.  His alcohol level today was negative which is reassuring.  Patient is ambulatory with the brace he feels comfortable with discharge home.  The patient is on the cardiac monitor to evaluate for evidence of arrhythmia and/or significant heart rate changes.   FINAL CLINICAL IMPRESSION(S) / ED DIAGNOSES   Final diagnoses:  Compression fracture of T12 vertebra, initial encounter [facility])  Fall, initial encounter     Rx / DC Orders   ED Discharge Orders          Ordered    Ambulatory referral to Cardiology        11/04/21 1249             Note:  This document was prepared using Dragon voice recognition software and may include unintentional dictation errors.   Concha Se, MD 11/04/21 [phone]

## 2021-11-04 NOTE — ED Notes (Signed)
Requested secretary call ortho for pt's back brace.

## 2021-11-04 NOTE — Discharge Instructions (Addendum)
Wear the TLSO brace to help with ambulation.  You can take it off when you shower or when you sleep.  You can call the neurosurgeon number to make follow-up.  We have referred you to cardiology in case you did pass out today to discuss getting a Holter monitor if you have recurrent syncope return to the ER.  We have prescribed a short course of oxycodone.  Do not take this with any sedative medicines as it can lead to increasing falls, death.  Take oxycodone as prescribed. Do not drink alcohol, drive or participate in any other potentially dangerous activities while taking this medication as it may make you sleepy. Do not take this medication with any other sedating medications, either prescription or over-the-counter. If you were prescribed Percocet or Vicodin, do not take these with acetaminophen (Tylenol) as it is already contained within these medications.  This medication is an opiate (or narcotic) pain medication and can be habit forming. Use it as little as possible to achieve adequate pain control. Do not use or use it with extreme caution if you have a history of opiate abuse or dependence. If you are on a pain contract with your primary care doctor or a pain specialist, be sure to let them know you were prescribed this medication today from the Emergency Department. This medication is intended for your use only - do not give any to anyone else and keep it in a secure place where nobody else, especially children, have access to it.  1. Small bilateral pleural effusions with overlying bibasilar atelectasis and/or consolidation. 2. Cardiomegaly.

## 2021-11-04 NOTE — Progress Notes (Signed)
Orthopedic Tech Progress Note Patient Details:  Brian [name] 05-02-1970 [id number] Called in order for TLSO Patient ID: Brian [name], male   DOB: 12-26-1970, 51 y.o.   MRN: [id number]  [name] 11/04/2021, 12:51 PM

## 2021-11-04 NOTE — Telephone Encounter (Cosign Needed)
Patient called, needs prescription sent to [facility] on [address]. Has not picked up prescription yet per pdmp review. Prescription is for oxycodone.

## 2021-11-04 NOTE — ED Notes (Signed)
Called [facility] for TLSO brace [phone]

## 2021-11-04 NOTE — ED Notes (Signed)
Rainbow sent to the lab at this time.  

## 2021-11-05 MED ORDER — OXYCODONE HCL 5 MG PO TABS: 5.0000 mg | ORAL_TABLET | Freq: Four times a day (QID) | ORAL | 0 refills | Status: DC | PRN

## 2021-11-07 ENCOUNTER — Telehealth: Payer: Self-pay

## 2021-11-07 ENCOUNTER — Ambulatory Visit: Payer: Medicaid Other | Admitting: Cardiology

## 2021-11-07 ENCOUNTER — Encounter: Payer: Self-pay | Admitting: Family Medicine

## 2021-11-07 NOTE — Telephone Encounter (Signed)
-----   Message from Peggyann Shoals sent at 11/07/2021 11:11 AM EDT ----- Regarding: ER fu pain med Contact: [phone] He is scheduled for hospital f/u in 2 weeks per Dr.Y on 11/22/2021. He is new to the area and has not established with a pcp yet. He wants to know can he prescribe  pain medication oxy 5mg  every 4-6 hours until his appt with Dr.Yarbrough. he uses [facility] [address] in [place].

## 2021-11-07 NOTE — Progress Notes (Unsigned)
Referring Physician:  Concha Se, MD [address] [place],  [place] [address]  Primary Physician:  Patient, No Pcp Per  History of Present Illness: 11/10/2021 Mr. Brian [name] was seen in ED on 11/04/21 for T12 compression fracture s/p fall. He was placed in TLSO brace and is here for follow up.   He was given oxycodone on discharge from ED. He is to follow up with cardiology as well due to question of syncope versus fall.   He has been wearing his brace. He continues with thoracic pain that goes "straight through" from his chest to his thoracic spine. Pain is sharp in nature. No radiating pain into his arms/legs. He does not have pain that wraps around to his chest. He's had intermittent numbness in left arm since his cardiac surgery in February.   He takes creon for pancreatitis is not supposed to take NSAIDS. Has been taking oxycodone with some relief. He is on ELIQUIS.   He has history of ETOH abuse, pancreatitis, asthma, PE on ELIQUIS, ascending aortic aneurysm status post aortic root replacement (03/16/20).   Conservative measures:  Physical therapy: non  Multimodal medical therapy including regular antiinflammatories: He is on ELIQUIS. oxycodone  Injections: No epidural steroid injections  Past Surgery: No spinal surgery  The symptoms are causing a significant impact on the patient's life.   Review of Systems:  A 10 point review of systems is negative, except for the pertinent positives and negatives detailed in the HPI.  Past Medical History: Past Medical History:  Diagnosis Date   ADHD    Alcohol abuse    Anxiety    Asthma    Congenital heart defect    Depression    Hyperlipidemia    Hypertension    Pulmonary thromboembolism (HCC)    Seizure (HCC)    Thoracic aortic aneurysm [facility])     Past Surgical History: Past Surgical History:  Procedure Laterality Date   artificial aorta and pig valve  02/25/2020   CARDIAC CATHETERIZATION      CHOLECYSTECTOMY  2004   COLONOSCOPY     DENTAL SURGERY     LIVER BIOPSY  2004    Allergies: Allergies as of 11/10/2021 - Review Complete 11/10/2021  Allergen Reaction Noted   Metoprolol Anaphylaxis 02/24/2020   Lithium Other (See Comments) 03/20/2012   Nsaids Other (See Comments) 06/03/2017   Valproic acid Other (See Comments) 07/31/2013   Depakote [divalproex sodium] Anxiety 11/09/2015   Penicillins Rash and Other (See Comments) 06/22/2013   Sulfa antibiotics Other (See Comments) and Rash 07/31/2013    Medications: Outpatient Encounter Medications as of 11/10/2021  Medication Sig   lipase/protease/amylase (CREON) 36000 UNITS CPEP capsule Take 1 capsule by mouth. 7 times a day   sertraline (ZOLOFT) 100 MG tablet Take 100 mg by mouth 2 (two) times daily.   albuterol (PROVENTIL HFA;VENTOLIN HFA) 108 (90 Base) MCG/ACT inhaler Inhale 1 puff into the lungs every 4 (four) hours as needed for wheezing or shortness of breath.   atorvastatin (LIPITOR) 20 MG tablet Take 20 mg by mouth daily.   Budesonide-Formoterol Fumarate (SYMBICORT IN) Inhale 1 puff into the lungs 2 (two) times daily.   carvedilol (COREG) 12.5 MG tablet Take 12.5 mg by mouth 2 (two) times daily.   clonazePAM (KLONOPIN) 1 MG tablet Take 1 tablet by mouth 3 (three) times daily as needed.   ELIQUIS 2.5 MG TABS tablet Take 2.5 mg by mouth 2 (two) times daily.   fenofibrate (TRICOR) 145 MG  tablet Take 145 mg by mouth daily.   FOCALIN XR 20 MG 24 hr capsule Take 20 mg by mouth every evening.   FOCALIN XR 30 MG CP24 Take 1 capsule by mouth every morning.   furosemide (LASIX) 20 MG tablet Take 20 mg by mouth daily.   ondansetron (ZOFRAN-ODT) 8 MG disintegrating tablet Place 2 tablets under the tongue 2 (two) times daily.   polyethylene glycol (MIRALAX / GLYCOLAX) 17 g packet Take 1 packet by mouth daily.   [DISCONTINUED] chlorhexidine (PERIDEX) 0.12 % solution 15 mLs by Mouth Rinse route 2 (two) times daily.   [DISCONTINUED]  Fluticasone-Salmeterol (ADVAIR DISKUS IN) Inhale 1 puff into the lungs 2 (two) times daily.   [DISCONTINUED] methylphenidate (RITALIN) 20 MG tablet Take 1 tablet by mouth 3 (three) times daily.   [DISCONTINUED] ondansetron (ZOFRAN ODT) 4 MG disintegrating tablet Take 1 tablet (4 mg total) by mouth every 8 (eight) hours as needed for nausea or vomiting.   [DISCONTINUED] oxyCODONE (ROXICODONE) 5 MG immediate release tablet Take 1 tablet (5 mg total) by mouth every 6 (six) hours as needed.   No facility-administered encounter medications on file as of 11/10/2021.    Social History: Social History   Tobacco Use   Smoking status: Never   Smokeless tobacco: Never  Substance Use Topics   Alcohol use: Yes    Comment: drinks a 5th a day   Drug use: No    Family Medical History: No family history on file.  Physical Examination: Vitals:   11/10/21 0954  BP: 120/80  Pulse: 92    General: Patient is well developed, well nourished, calm, collected, and in no apparent distress. Attention to examination is appropriate.  Respiratory: Patient is breathing without any difficulty.   NEUROLOGICAL:     Awake, alert, oriented to person, place, and time.  Speech is clear and fluent. Fund of knowledge is appropriate.   Cranial Nerves: Pupils equal round and reactive to light.  Facial tone is symmetric.  Facial sensation is symmetric.  ROM of TL spine not tested due to fracture.   Strength: Side Biceps Triceps Deltoid Interossei Grip Wrist Ext. Wrist Flex.  R +4 5 5 5 5 5 5   L 5 5 5 5 5 5 5    Side Iliopsoas Quads Hamstring PF DF EHL  R 5 5 5 5 5 5   L 5 5 5 5 5 5    Reflexes are 2+ and symmetric at the biceps, triceps, brachioradialis, patella and achilles.   Hoffman's is absent.  Clonus is not present.   Bilateral upper and lower extremity sensation is intact to light touch.     Gait is normal.    He is wearing TLSO brace.   He has tenderness in TL region.   Medical Decision  Making  Imaging:  Xrays of thoracic spine dated 11/04/21:  FINDINGS: Normal thoracic segmentation. Mild chronic levoconvex upper and dextroconvex lower thoracic scoliosis appears stable since the 2018 radiographs. Maintained thoracic kyphosis. Cervicothoracic junction alignment appears to be normal. Disc spaces appear normal for age.   But superior endplate compression of the T12 vertebral body appears to be new since 2018. Estimated 25% anterior loss of vertebral body height. The the T12 level is more completely visible on lumbar radiographs today (please see that report) no retropulsion or complicating features identified.   Previous sternotomy.  Grossly intact visible posterior ribs.   IMPRESSION: 1. Mild to moderate T12 compression fracture, new since 2018 and suspicious for acute injury in  this setting. 25% anterior loss of height. If specific therapy such as vertebroplasty is desired, Lumbar MRI (given proximity to the lumbosacral junction) orNuclear Medicine Whole-body Bone Scan would confirm candidacy for vertebroplasty. 2. Chronic thoracic scoliosis.     Electronically Signed   By: Odessa Fleming M.D.   On: 11/04/2021 04:44   Xrays of lumbar spine dated 11/04/21:   FINDINGS: Normal lumbar segmentation, concordant with thoracic spine numbering today. Maintained lumbar lordosis. Maintained lumbar vertebral height and alignment. T12 anterior superior endplate compression fracture redemonstrated. No lumbar pars fracture. Grossly intact visible sacrum and SI joints. Normal for age lumbar disc spaces with mild endplate spurring.   Negative visible bowel gas pattern.   IMPRESSION: 1. T12 compression fracture, see thoracic series reported separately. 2.  No acute osseous abnormality identified in the lumbar spine.     Electronically Signed   By: Odessa Fleming M.D.   On: 11/04/2021 04:46  CT thoracic and lumbar spine dated 11/04/21:  FINDINGS: CT THORACIC SPINE FINDINGS    Alignment: Upper thoracic levocurvature and lower thoracic dextrocurvature. No listhesis.   Vertebrae: As seen on the same-day radiographs, there is a compression fracture of T12, which appears to be acute, with approximately 30% vertebral body height loss anteriorly. No retropulsion. No evidence of involvement of the posterior cortex or posterior elements. Vertebral body heights are otherwise preserved. No suspicious osseous lesions.   Paraspinal and other soft tissues: Please see same-day CT chest abdomen pelvis.   Disc levels: No significant spinal canal stenosis. Mild left neural foraminal narrowing T6-T7, T7-T8, and T8-T9.   CT LUMBAR SPINE FINDINGS   Segmentation: 5 lumbar type vertebrae.   Alignment: No listhesis. Preservation of the normal lumbar lordosis.   Vertebrae: No acute fracture or suspicious osseous lesion in the lumbar spine. Vertebral body heights are preserved.   Paraspinal and other soft tissues: Please see same-day CT chest abdomen pelvis.   Disc levels:   L4-L5: Mild disc bulge and mild facet arthropathy. Mild spinal canal stenosis. No neural foraminal narrowing.   IMPRESSION: CT THORACIC SPINE IMPRESSION   Acute compression fracture of T12, with approximately 30% vertebral body height loss anteriorly. No retropulsion.   CT LUMBAR SPINE IMPRESSION   1. No acute fracture or traumatic listhesis. 2. Mild spinal canal stenosis at L4-L5.     Electronically Signed   By: Wiliam Ke M.D.   On: 11/04/2021 11:42  CT scan of cervical spine dated 1013/23:  FINDINGS: Alignment: Normal.   Skull base and vertebrae: No acute fracture. No primary bone lesion or focal pathologic process.   Soft tissues and spinal canal: No prevertebral fluid or swelling. No visible canal hematoma.   Disc levels: Mild endplate sclerosis, mild to moderate severity anterior osteophyte formation and mild posterior bony spurring are seen at the level C5-C6.   Mild  intervertebral disc space narrowing is seen at C4-C5 and C5-C6.   Mild, bilateral multilevel facet joint hypertrophy is noted.   Upper chest: Negative.   Other: None.   IMPRESSION: 1. No acute fracture or subluxation in the cervical spine. 2. Mild degenerative changes, most prominent at the level of C5-C6.     Electronically Signed   By: Aram Candela M.D.   On: 11/04/2021 05:46  I have personally reviewed the images and agree with the above interpretation.  Assessment and Plan: Mr. Shonk is a pleasant 51 y.o. male with T12 compression fracture s/p fall/syncope on 11/04/21. He has constant pain in lower thoracic area.  Xrays and CT scan from ED show T12 compression fracture.   He has some slight weakness in right arm (biceps) on exam. Unsure of etiology of this. CT of cervical spine shows spondylosis and DDD at C4-C6. No neck or radicular arm pain.   Treatment options discussed with patient and following plan made:   - Continue with TLSO brace. Do not wear to sleep.  - MRI of thoracic spine to further evaluate T12 fracture. History of aortic root replacement with pig valve in February.  - Refill given on oxycodone. Reviewed dosing and side effects. PMP reviewed and is appropriate.  - Depending on results of MRI, may consider referral to IR to discuss kyphoplasty.  - Of note, he is on ELIQUIS. - Will set up phone visit to review MRI results.  - Slight weakness in right biceps on exam, unsure of cause. Will follow. He's been working on building up overall strength since cardiac surgery in February.   I spent a total of 30 minutes in face-to-face and non-face-to-face activities related to this patient's care toda including review of outside records, review of imaging, review of symptoms, physical exam, discussion of differential diagnosis, discussion of treatment options, and documentation.   Thank you for involving me in the care of this patient.   Drake Leach  PA-C Dept. of Neurosurgery

## 2021-11-07 NOTE — Telephone Encounter (Signed)
Patient confirmed appt for 10/19 with Stacy.

## 2021-11-07 NOTE — Telephone Encounter (Signed)
Okay to put him at 9:30 on Friday.

## 2021-11-10 ENCOUNTER — Encounter: Payer: Self-pay | Admitting: Orthopedic Surgery

## 2021-11-10 ENCOUNTER — Ambulatory Visit: Payer: Medicaid Other | Admitting: Orthopedic Surgery

## 2021-11-10 VITALS — BP 120/80 | HR 92 | Ht 73.0 in | Wt 222.2 lb

## 2021-11-10 DIAGNOSIS — S22080A Wedge compression fracture of T11-T12 vertebra, initial encounter for closed fracture: Secondary | ICD-10-CM | POA: Diagnosis not present

## 2021-11-10 MED ORDER — OXYCODONE HCL 5 MG PO TABS: 5.0000 mg | ORAL_TABLET | Freq: Four times a day (QID) | ORAL | 0 refills | Status: DC | PRN

## 2021-11-10 NOTE — Patient Instructions (Signed)
It was so nice to see you today, I am sorry that you are hurting so much.   You have a compression fracture (broken bone) at T12. This is what is likely causing your mid to lower back pain.   Stay in the TLSO brace. Do not wear to sleep. No bending, twisting, or lifting.   I sent a refill of oxycodone to your pharmacy. Take only as needed for severe pain. This can make you sleepy and/or constipated.   I want to get an MRI of your mid back to look into things further. We will call you to set this up at [facility].   We will set up a phone visit to review your MRI results. We may consider a referral to interventional radiology at [facility] for evaluation for a kyphoplasty procedure.   Please do not hesitate to call if you have any questions or concerns. You can also message me in [place].   Geronimo Boot PA-C [phone]

## 2021-11-11 ENCOUNTER — Telehealth: Payer: Self-pay

## 2021-11-11 DIAGNOSIS — S22080A Wedge compression fracture of T11-T12 vertebra, initial encounter for closed fracture: Secondary | ICD-10-CM

## 2021-11-11 MED ORDER — METHOCARBAMOL 500 MG PO TABS: 500.0000 mg | ORAL_TABLET | Freq: Four times a day (QID) | ORAL | 0 refills | Status: DC | PRN

## 2021-11-11 NOTE — Telephone Encounter (Signed)
They have called the patient and left a message for him to call back.

## 2021-11-11 NOTE — Telephone Encounter (Signed)
I do not recommend changing his pain medication.   I recommend adding a muscle relaxer. Will send robaxin to his pharmacy. Let him know to take only as needed. This can make his sleepy.   I don't see any interactions between robaxin and his other medications. Have him check to be sure it does not interact with his creon.

## 2021-11-11 NOTE — Telephone Encounter (Signed)
-----   Message from Peggyann Shoals sent at 11/11/2021  2:17 PM EDT ----- Regarding: pain med Contact: [phone] The oxycodone is not lasting the 4 hours. Can you try to prescribe something stronger or medication with extended release on it? Is there an update on his MRI?

## 2021-11-11 NOTE — Telephone Encounter (Signed)
I spoke with the patient and notified him about the message below and he verbalized understanding.   I also told him that [facility] had left a message on his phone today and gave him the number to [facility] so he can get his MRI scheduled.

## 2021-11-12 ENCOUNTER — Ambulatory Visit
Admission: RE | Admit: 2021-11-12 | Discharge: 2021-11-12 | Disposition: A | Discharge status: Home / Self Care | Payer: Medicaid Other | Source: Ambulatory Visit | Attending: Orthopedic Surgery | Admitting: Orthopedic Surgery

## 2021-11-12 DIAGNOSIS — S22080A Wedge compression fracture of T11-T12 vertebra, initial encounter for closed fracture: Secondary | ICD-10-CM | POA: Diagnosis present

## 2021-11-14 ENCOUNTER — Telehealth: Payer: Self-pay

## 2021-11-14 DIAGNOSIS — S22080A Wedge compression fracture of T11-T12 vertebra, initial encounter for closed fracture: Secondary | ICD-10-CM

## 2021-11-14 MED ORDER — OXYCODONE HCL 5 MG PO TABS: 5.0000 mg | ORAL_TABLET | Freq: Four times a day (QID) | ORAL | 0 refills | Status: DC | PRN

## 2021-11-14 NOTE — Telephone Encounter (Signed)
Patient notified of med refill

## 2021-11-14 NOTE — Telephone Encounter (Signed)
He forgot to mention that the pharmacy would only give him qty 16 tablets not the 20 that was sent in so he had to forfeit 4 tablets from his last rx.

## 2021-11-14 NOTE — Telephone Encounter (Signed)
PMP reviewed. Was given #20 pills from my prescription last week. He was only given 16 from the ED.   Okay to refill- he can pick up tomorrow. Was given 5 day supply on 11/10/21.   Refill of oxycodone sent to pharmacy.

## 2021-11-14 NOTE — Telephone Encounter (Signed)
-----   Message from [facility] sent at 11/14/2021 11:58 AM EDT ----- Regarding: pain med refill Contact: [phone] Oxycotin 5mg  every 6 hours [facility]

## 2021-11-14 NOTE — Telephone Encounter (Signed)
Please let him know we sent in the oxycodone.

## 2021-11-15 ENCOUNTER — Other Ambulatory Visit: Payer: Self-pay | Admitting: Neurosurgery

## 2021-11-15 DIAGNOSIS — S22080A Wedge compression fracture of T11-T12 vertebra, initial encounter for closed fracture: Secondary | ICD-10-CM

## 2021-11-15 MED ORDER — OXYCODONE HCL 5 MG PO TABS: 5.0000 mg | ORAL_TABLET | Freq: Four times a day (QID) | ORAL | 0 refills | Status: DC | PRN

## 2021-11-15 NOTE — Telephone Encounter (Addendum)
Pt stopped by our front desk stating that [facility] couldn't fill his rx. I spoke with [facility].   They are receiving an error stating the prescriber not allowed, pt is locked into a specific prescriber. They called the pharmacy help desk with Medicaid [employer] and was told the prescriber is no longer w/ Medicaid. The pharmacist explained to [facility] that an rx was just filled on 10/19 without issue.  The rx needs to be resent by a prescriber that is enrolled w/ [employer].

## 2021-11-15 NOTE — Telephone Encounter (Signed)
Patient has been notified and will call the pharmacy to see if his rx is ready

## 2021-11-15 NOTE — Telephone Encounter (Signed)
Patient was out here in the lobby waiting. He said that [facility] will call him back in 2 hours he started an appeal with them. He is going home to lay down. Can you please call him if you get an update from  the pharmacy. [phone]

## 2021-11-16 ENCOUNTER — Telehealth: Payer: Self-pay | Admitting: Orthopedic Surgery

## 2021-11-16 DIAGNOSIS — S22080A Wedge compression fracture of T11-T12 vertebra, initial encounter for closed fracture: Secondary | ICD-10-CM

## 2021-11-16 NOTE — Telephone Encounter (Signed)
Please call him and make sure he was able to get his prescription from Dr. Izora Ribas yesterday.

## 2021-11-16 NOTE — Telephone Encounter (Signed)
MRI of thoracic spine dated 11/12/21:  FINDINGS: Limited cervical spine imaging: Stable. Intermittent mild for age cervical disc and endplate degeneration.   Thoracic spine segmentation:  Normal as on the comparison [place] it   Alignment: Stable. Some straightening of thoracic kyphosis and dextroconvex thoracic scoliosis. No spondylolisthesis.   Vertebrae: T12 compression fracture with superior endplate deformity. Central loss of height now up to 48% (series 19, image 10). Patchy and confluent marrow edema throughout the T12 body, mildly tracking into the anterior pedicles. But the T12 posterior elements appears stable and intact. No retropulsion.   No other thoracic vertebral compression. Faint degenerative anterior endplate marrow edema at T6-T7. Normal background bone marrow signal.   Cord: Normal. Capacious thoracic spinal canal. Conus medullaris is at T12-L1.   Paraspinal and other soft tissues: Negative except for minor T12 paravertebral soft tissue edema.   Disc levels:   Mild for age thoracic spine degeneration with no spinal stenosis or discrete disc herniation.   Mild disc bulging and posterior element hypertrophy in the setting of thoracic scoliosis. And there is subsequent degenerative neural foraminal stenosis at the right T4 (moderate to severe) right T5 (mild-to-moderate), left T6 (mild to moderate), left T7 (moderate to severe), left T8 (moderate to severe), left T9 (moderate to severe), right T9 and bilateral T10 (mild-to-moderate) nerve levels.   But no T11 or T12 neural foraminal stenosis.   IMPRESSION: 1. Acute/Subacute T12 compression fracture now with up to 48% loss of height. But no retropulsion or complicating features.   2. Capacious thoracic spinal canal and no spinal stenosis. But posterior element degeneration associated with dextroconvex thoracic scoliosis results in multilevel neural foraminal stenosis which is up to severe at the right T4  and left T7 through T9 nerve levels.     Electronically Signed   By: Genevie Ann M.D.   On: 11/15/2021 08:45  I have personally reviewed the images and agree with the above interpretation.  Tried to call patient regarding results and left VM.   Will start referral to IR for possible kyphoplasty T12.

## 2021-11-16 NOTE — Addendum Note (Signed)
Addended byGeronimo Boot on: 11/16/2021 03:24 PM   Modules accepted: Orders

## 2021-11-16 NOTE — Telephone Encounter (Signed)
Patient was able to pick up rx 

## 2021-11-16 NOTE — Telephone Encounter (Signed)
Spoke with patient and discussed results.   He agrees with referral to IR. Will let us know if he does not hear from them by next week.

## 2021-11-16 NOTE — Telephone Encounter (Signed)
Yes he did.

## 2021-11-17 ENCOUNTER — Telehealth: Payer: Self-pay

## 2021-11-17 ENCOUNTER — Other Ambulatory Visit: Payer: Self-pay | Admitting: Orthopedic Surgery

## 2021-11-17 DIAGNOSIS — S22080A Wedge compression fracture of T11-T12 vertebra, initial encounter for closed fracture: Secondary | ICD-10-CM

## 2021-11-17 NOTE — Telephone Encounter (Signed)
Please call pharmacy and see if they still have Dr. Rhea Bleacher prescription from 10/24?   If so, they can fill this on Sunday.   I do not know if any interactions with robaxin and miralax. I recommend he discuss this with the pharmacist.

## 2021-11-17 NOTE — Telephone Encounter (Signed)
Patient calling back to make sure that Stacy received the message.

## 2021-11-17 NOTE — Telephone Encounter (Signed)
Thoughts?

## 2021-11-17 NOTE — Telephone Encounter (Signed)
-----   Message from Peggyann Shoals sent at 11/17/2021 10:20 AM EDT ----- Regarding: pain med refill Contact: [phone] Patient has called 3 times today. I sent his referral to [facility] this morning at 9am. He was just thinking that his pain medication will run out on Sunday. Can you go ahead and send in the refill put have them hold it until Sunday? Also he is having stomach pain. He did some reach and found out that methocarbamol can have a reaction with Murelax. Can you change his muscle relaxer. He knows that there is something else that he can try.

## 2021-11-18 ENCOUNTER — Telehealth: Payer: Medicaid Other | Admitting: Orthopedic Surgery

## 2021-11-18 ENCOUNTER — Other Ambulatory Visit: Payer: Self-pay | Admitting: Physician Assistant

## 2021-11-18 MED ORDER — OXYCODONE HCL 5 MG PO TABS: 5.0000 mg | ORAL_TABLET | Freq: Four times a day (QID) | ORAL | 0 refills | Status: DC | PRN

## 2021-11-18 MED ORDER — TIZANIDINE HCL 2 MG PO TABS: 2.0000 mg | ORAL_TABLET | Freq: Three times a day (TID) | ORAL | 0 refills | Status: DC | PRN

## 2021-11-18 NOTE — Telephone Encounter (Signed)
I spoke with Mr Sweda. He verbalized understanding.

## 2021-11-18 NOTE — Telephone Encounter (Signed)
I spoke with the pharmacy and they stated that he picked up oxycodone that was prescribed by [facility]. They are stating that there is not a prescription at the pharmacy under Dr. Izora Ribas.  Please send in Oxycodone.

## 2021-11-18 NOTE — Addendum Note (Signed)
Addended byGeronimo Boot on: 11/18/2021 01:06 PM   Modules accepted: Orders

## 2021-11-18 NOTE — Telephone Encounter (Signed)
Patient is calling back. He wants to makes sure that he will be able to pick up pain medication on Sunday. He has an appt on Tuesday with [facility]. He is asked about the muscle relaxer. He wants something else beside methocarbamol.

## 2021-11-18 NOTE — Telephone Encounter (Signed)
PMP reviewed. He would be out of pain meds Saturday and is due for refill Sunday.   Refill of oxycodone sent to pharmacy to fill on Sunday 11/20/21. Let him know this is a 10 day supply.   Tell him to stop methocarbamol.   I have sent tizanidine to his pharmacy to take as needed for muscle spasms. It can make him sleepy.   Have him check with pharmacist to make sure the tizanidine does not interact with his other medications, especially his creon. I do not know of any interactions with the miralax.

## 2021-11-22 ENCOUNTER — Ambulatory Visit: Payer: Self-pay | Admitting: Neurosurgery

## 2021-11-22 ENCOUNTER — Ambulatory Visit
Admission: RE | Admit: 2021-11-22 | Discharge: 2021-11-22 | Disposition: A | Discharge status: Home / Self Care | Payer: Medicaid Other | Source: Ambulatory Visit | Attending: Orthopedic Surgery | Admitting: Orthopedic Surgery

## 2021-11-22 ENCOUNTER — Telehealth: Payer: Self-pay

## 2021-11-22 DIAGNOSIS — E785 Hyperlipidemia, unspecified: Secondary | ICD-10-CM | POA: Insufficient documentation

## 2021-11-22 DIAGNOSIS — S22080A Wedge compression fracture of T11-T12 vertebra, initial encounter for closed fracture: Secondary | ICD-10-CM

## 2021-11-22 HISTORY — PX: IR RADIOLOGIST EVAL & MGMT: IMG5224

## 2021-11-22 NOTE — Telephone Encounter (Signed)
-----   Message from Peggyann Shoals sent at 11/21/2021  1:37 PM EDT ----- Regarding: pain med refill Contact: [phone] Oxycodone 5g Can you have them hold it until Friday. He picked up his rx yesterday but they would only dispense 20 tabs he had to forfeit 20. He has an appt on 10/31 for kypho consult

## 2021-11-22 NOTE — Telephone Encounter (Signed)
He was seen today with Dr.E and once his surgery is approved through medicaid he will have surgery as soon as next week.

## 2021-11-22 NOTE — H&P (Signed)
Interventional Radiology - Clinic Visit, Initial H&P    Referring Provider: Geronimo Boot, PA-C  Reason for Visit: T12 compression fracture     History of Present Illness  Brian [name] is a 51 y.o. male with a relevant past medical history of thoracic aortic repair with valve replacement on Eliquis seen today in Interventional Radiology clinic for T12 compression fracture.  Patient reports that he recently moved to new apartment, and slipped on a step and fell onto his lower back earlier this month.  He was evaluated in the emergency department on October 13th, where MRI of his thoracic and lumbar spine demonstrated an acute T12 compression fracture with 30% height loss.  He was discharged from the ED with a back brace and prescription pain medication.  He continued to have no improvement in his back pain, and repeat thoracic spine MRI on October 21 demonstrated progressive height loss of his T12 compression fracture, estimated at 50%.    He describes severe 10/10 pain with activity, that he describes as throbbing and sharp.  At rest and with pain medication, there is mild improvement to his pain which is 7/10.  He is not able to tolerate prescription narcotic pain medication (oxycodone) due to nausea and stomach problems with his other medications that he takes.  He is able to tolerate muscle relaxer (tizanidine) somewhat better, but continues to have significant back pain.  He describes moderate disability on the [employer] disability questionnaire 12/24 positive.    Past medical history significant for thoracic aortic repair and valve replacement, for which he takes Eliquis.  This was performed in February 2022.     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 Refer to chart for details. Current Outpatient Medications  Medication Instructions   albuterol (PROVENTIL HFA;VENTOLIN HFA) 108 (90 Base) MCG/ACT inhaler 1 puff, Inhalation, Every 4 hours PRN   atorvastatin (LIPITOR) 20 mg, Oral, Daily   Budesonide-Formoterol Fumarate (SYMBICORT IN) 1 puff, Inhalation, 2 times daily   clonazePAM (KLONOPIN) 1 MG tablet 1 tablet, Oral, 3 times daily PRN   cyanocobalamin 100 MCG tablet Oral   Eliquis 2.5 mg, Oral, 2 times daily   fenofibrate (TRICOR) 145 mg, Oral, Daily   FOCALIN XR 30 MG CP24 1 capsule, Oral, Every morning   Focalin XR 20 mg, Oral, Every evening   furosemide (LASIX) 20 mg, Oral, Daily   lipase/protease/amylase (CREON) 36000 UNITS CPEP capsule 1 capsule, Oral, 7 times a day   omeprazole (PRILOSEC) 20 mg, Oral, Daily   oxyCODONE (ROXICODONE) 5 mg, Oral, Every 6 hours PRN   sertraline (ZOLOFT) 100 mg, Oral, 2 times daily   thiamine 50 MG tablet Oral   tiZANidine (ZANAFLEX) 2 mg, Oral, Every 8 hours PRN   triamcinolone (NASACORT) 55 MCG/ACT AERO nasal inhaler Nasal  Allergies Allergies  Allergen Reactions   Metoprolol Anaphylaxis    AFFECTED MOTOR SKILLS Tolerated coreg 03/02/20   Lithium Other (See Comments)    Emotional issues admitted after taking   Valproic Acid Other (See Comments)    hallucinations    Depakote [Divalproex Sodium] Anxiety   Nsaids Other (See Comments)    Gastritis Contraindicated due to Eliquis and Creon    Penicillins Rash    Tolerated Ancef February 2022    Sulfa Antibiotics Rash   Does patient have contrast allergy: No     Physical Exam Current Vitals Temp: 97.8 F (36.6 C) ( )   Pulse Rate: 72  Resp: 18  BP: 134/89  SpO2: 96 %  Height: 6\' 1"  (185.4 cm)  Weight: 99.8 kg  Body mass index is 29.03 kg/m.  General: Alert and answers questions appropriately.  Cardiac: Regular rate. No dependent edema. Pulmonary: Normal work of breathing. On room air. Back: Tenderness in mid to lower back, back brace in place.    Pertinent Lab Results    Latest Ref Rng & Units 11/04/2021    3:52 AM 04/07/2016   11:27 AM 03/15/2016    2:33 PM  CBC  WBC 4.0 - 10.5 K/uL 8.2  7.4  8.4   Hemoglobin 13.0 - 17.0 g/dL 03/17/2016  28.4  13.2   Hematocrit 39.0 - 52.0 % 46.8  48.1  46.2   Platelets 150 - 400 K/uL 177  136  131       Latest Ref Rng & Units 11/04/2021    3:52 AM 04/07/2016   11:27 AM 03/15/2016    2:33 PM  CMP  Glucose 70 - 99 mg/dL 90  03/17/2016  102   BUN 6 - 20 mg/dL 13  15  18    Creatinine 0.61 - 1.24 mg/dL 725   3.66   Sodium 135 - 145 mmol/L 136  135  130   Potassium 3.5 - 5.1 mmol/L 3.6  3.8  2.8   Chloride 98 - 111 mmol/L 92  98  89   CO2 22 - 32 mmol/L 24  27  22    Calcium 8.9 - 10.3 mg/dL 9.0  8.6  8.9   Total Protein 6.5 - 8.1 g/dL 9.1  8.3  8.8   Total Bilirubin 0.3 - 1.2 mg/dL 1.7  0.8  2.2   Alkaline Phos 38 - 126 U/L 69  96  115   AST 15 - 41 U/L 88  86  151   ALT 0 - 44 U/L 44  98  137       Relevant and/or Recent Imaging: T spine MRI Nov 12 2021  IMPRESSION: 1. Acute/Subacute T12 compression fracture now with up to 48% loss of height. But no retropulsion or complicating features. 2. Capacious thoracic spinal canal and no spinal stenosis. But posterior element degeneration associated with dextroconvex thoracic scoliosis results in multilevel neural foraminal stenosis which is up to severe at the right T4 and left T7 through T9 nerve levels. Electronically Signed By: 3.47 M.D. On: 11/15/2021 08:45   T spine XR Nov 04 2921  Personally reviewed, patient appears osteopenic     Assessment & Plan:   Patient has suffered acute  traumatic  fracture of the T12 vertebra, and I suspect underlying osteopenia/osteoporosis given his recent radiographs.   Serial thoracic spine MRI demonstrate progressive height loss of the T12 vertebral body (30% to 50% height loss), and failure of conservative management including back brace and prescription  pain medication.    History and exam have demonstrated the following:  Acute/Subacute fracture by imaging dated 11/12/2021, Pain on exam concordant with level of fracture, Failure of conservative therapy and pain refractory to narcotic pain mediation, Inability to tolerate narcotic pain medication due to side effects, and Significant disability on the [employer] Disability Questionnaire with 12/24 positive symptoms, reflecting significant impact/impairment of (ADLs)   ICD-10-CM Codes that Support Medical Necessity ([email or web address]?[email or web address])  M80.08XA    Age-related osteoporosis with current pathological fracture, vertebra(e), initial encounter for fracture  S22.080A    Wedge compression fracture of T11-T12 vertebra, initial encounter for closed fracture    Plan:  T12 vertebral body augmentation with balloon kyphoplasty  Post-procedure disposition: outpatient [facility]  Medication holds: Eliquis (TBD)   The patient has suffered a fracture of the T12 vertebral body. It is recommended that patients aged 46 years or older be evaluated for possible testing or treatment of osteoporosis. A copy of this consult report is sent to the patient's referring physician.  DEXA recommended  Advanced Care Plan: The patient did not want to provide an Advanced Care Plan at the time of this visit     Total time spent on today's visit was over 60 Minutes, including both face-to-face time and non face-to-face time, personally spent on review of chart (including labs and relevant imaging), discussing further workup and treatment options, referral to specialist if  needed, reviewing outside records if pertinent, answering patient questions, and coordinating care regarding T12 fracture as well as management strategy.        Olive Bass, MD  Vascular and Interventional Radiology 11/22/2021 1:40 PM

## 2021-11-23 NOTE — Telephone Encounter (Signed)
I reviewed his PMP. He was only given #20 pills. He will be out Friday. I will send a new prescription Friday morning. Please let him know.

## 2021-11-23 NOTE — Telephone Encounter (Signed)
Patient has been notified and agreed with Stacy's plan.

## 2021-11-24 ENCOUNTER — Ambulatory Visit: Payer: Self-pay

## 2021-11-25 ENCOUNTER — Telehealth: Payer: Self-pay | Admitting: Orthopedic Surgery

## 2021-11-25 ENCOUNTER — Other Ambulatory Visit: Payer: Self-pay | Admitting: Orthopedic Surgery

## 2021-11-25 DIAGNOSIS — S22080A Wedge compression fracture of T11-T12 vertebra, initial encounter for closed fracture: Secondary | ICD-10-CM

## 2021-11-25 MED ORDER — OXYCODONE HCL 5 MG PO TABS: 5.0000 mg | ORAL_TABLET | Freq: Four times a day (QID) | ORAL | 0 refills | Status: DC | PRN

## 2021-11-25 NOTE — Telephone Encounter (Signed)
-----   Message from Geronimo Boot, [place] sent at 11/23/2021 10:28 AM EDT ----- Send in refill of oxycodone.

## 2021-11-25 NOTE — Telephone Encounter (Signed)
He would be out of oxycodone. See last phone call.   PMP reviewed and is appropriate. Refill sent to pharmacy. Please let him know.

## 2021-11-25 NOTE — Telephone Encounter (Signed)
Please let him know that the prescription was sent.

## 2021-11-25 NOTE — Telephone Encounter (Signed)
Patient notified of med refill he confirmed.

## 2021-11-28 ENCOUNTER — Telehealth: Payer: Self-pay

## 2021-11-28 NOTE — Telephone Encounter (Signed)
-----   Message from Peggyann Shoals sent at 11/28/2021  9:20 AM EST ----- Regarding: med refill Contact: [phone] Tizanidine 2mg  1 tablet every 8 hours as needed [facility] [address]  On Wednesday he needs to pick up  Oxycodone 5mg  qty [place]

## 2021-11-28 NOTE — Telephone Encounter (Signed)
-----   Message from Patrisia Castaneda sent at 11/28/2021  9:20 AM EST ----- Regarding: med refill Contact: [phone] Tizanidine 2mg 1 tablet every 8 hours as needed [facility] [address]  On Wednesday he needs to pick up  Oxycodone 5mg qty 20 [facility] [address] [place]  

## 2021-11-28 NOTE — Telephone Encounter (Signed)
Zanaflex sent to pharmacy.   Will refill pain meds when due.

## 2021-11-28 NOTE — Telephone Encounter (Signed)
See other message. Zanaflex refilled. Will refill oxycodone when due.

## 2021-11-29 NOTE — Telephone Encounter (Signed)
He called to remind you to please send in his pain medication tomorrow.

## 2021-11-29 NOTE — Telephone Encounter (Signed)
I have sent myself a reminder to do this tomorrow.

## 2021-11-30 ENCOUNTER — Telehealth: Payer: Self-pay | Admitting: Orthopedic Surgery

## 2021-11-30 DIAGNOSIS — S22080A Wedge compression fracture of T11-T12 vertebra, initial encounter for closed fracture: Secondary | ICD-10-CM

## 2021-11-30 MED ORDER — OXYCODONE HCL 5 MG PO TABS: 5.0000 mg | ORAL_TABLET | Freq: Four times a day (QID) | ORAL | 0 refills | Status: DC | PRN

## 2021-11-30 NOTE — Telephone Encounter (Signed)
Patient notified of medication refill. 

## 2021-11-30 NOTE — Telephone Encounter (Signed)
PMP reviewed and is appropriate.   Refill of oxycodone sent to pharmacy. Please let him know. Advise him to keep taking the least amount possible.

## 2021-11-30 NOTE — Telephone Encounter (Signed)
-----   Message from Drake Leach, [place] sent at 11/28/2021 11:06 AM EST ----- Refill his oxycodone?

## 2021-12-02 ENCOUNTER — Telehealth: Payer: Self-pay

## 2021-12-02 NOTE — Telephone Encounter (Signed)
He said to please keep him updated.

## 2021-12-02 NOTE — Telephone Encounter (Signed)
-----   Message from Drake Leach, [place] sent at 12/02/2021  1:09 PM EST ----- Regarding: RE: kypho Contact: [phone] Let him know I called today and scheduled peer to peer for Tuesday. They make you schedule it, I could not do it today.   Will let him and [facility] know as soon as it is done.   Thanks!  ----- Message ----- From: Aretha Parrot, CMA Sent: 12/02/2021  12:49 PM EST To: Drake Leach, PA-C Subject: FW: kypho                                       ----- Message ----- From: [name] Sent: 12/02/2021  11:38 AM EST To: Aretha Parrot, CMA Subject: kypho                                          Patient is aware that his insurance denied kypho procedure. He call [facility] this morning and Lynden Ang told him to call Stacy. Do you have an update on that?

## 2021-12-02 NOTE — Telephone Encounter (Signed)
Please see stacys message below.

## 2021-12-05 ENCOUNTER — Telehealth: Payer: Self-pay

## 2021-12-05 DIAGNOSIS — S22080A Wedge compression fracture of T11-T12 vertebra, initial encounter for closed fracture: Secondary | ICD-10-CM

## 2021-12-05 MED ORDER — OXYCODONE HCL 5 MG PO TABS: 5.0000 mg | ORAL_TABLET | Freq: Four times a day (QID) | ORAL | 0 refills | Status: DC | PRN

## 2021-12-05 NOTE — Telephone Encounter (Signed)
-----   Message from [name] sent at 12/05/2021  7:57 AM EST ----- Regarding: oxy 5 Contact: [phone] Patient left a v/m on 11/12 at 4:32pm He needs a refill of oxy 5. They will only let him fill 5 days at a time. He was told that [place] can call medicaid at [phone] for a prior authorization to get qty 40 approved.

## 2021-12-05 NOTE — Telephone Encounter (Signed)
Patient is notified of med refill.

## 2021-12-05 NOTE — Telephone Encounter (Signed)
Refill of oxycodone sent to pharmacy. PMP reviewed and is appropriate.   Is there a prior auth form we can fill out to see if we can give him more than a 5 day supply?

## 2021-12-06 ENCOUNTER — Telehealth: Payer: Self-pay

## 2021-12-06 ENCOUNTER — Encounter: Payer: Self-pay | Admitting: Orthopedic Surgery

## 2021-12-06 DIAGNOSIS — S22080A Wedge compression fracture of T11-T12 vertebra, initial encounter for closed fracture: Secondary | ICD-10-CM

## 2021-12-06 MED ORDER — TIZANIDINE HCL 2 MG PO TABS: ORAL_TABLET | ORAL | 0 refills | Status: DC

## 2021-12-06 NOTE — Progress Notes (Signed)
Peer to peer done for T12 kyphoplasty procedure. Approved by Dr. Katrinka Blazing, approval [id number], valid 11/23/21-01/21/22.   Called Victorino Dike at [employer] [phone]) and left her a message with above information.   Called patient and let him know this was approved. Also told him that his zanaflex was called in and that we were working on calling his insurance about prior auth for 10 day supply of oxycodone.

## 2021-12-06 NOTE — Telephone Encounter (Signed)
-----   Message from [name] sent at 12/06/2021  7:54 AM EST ----- Regarding: med refill Contact: [phone] Patient left message on office voice mail last night at 7:21pm He is calling for Stacy, tomorrow she is going to do an interview with medicaid to have the procedure he needs approved. Can he get a refill of tizanidine 2mg  #30 [facility]

## 2021-12-06 NOTE — Telephone Encounter (Signed)
Refill okay for zanaflex. This was sent to his pharmacy.   He was told that we can call medicaid at [phone] for a prior authorization to get qty 40 approved for his oxycodone. Can you please check on this?   Thanks.

## 2021-12-07 ENCOUNTER — Telehealth: Payer: Self-pay

## 2021-12-07 NOTE — Telephone Encounter (Signed)
I have spoke with Brian [name] at [facility] about getting 40 pills approved. This has gotten sent to the pharmacist for review. We will receive a notification about the outcome.   Ref# [id number]

## 2021-12-07 NOTE — Telephone Encounter (Signed)
I agree his oxycodone is due on Saturday. Will refill Friday with pharmacy to fill Saturday. Will send myself a reminder on Friday.

## 2021-12-07 NOTE — Telephone Encounter (Signed)
-----   Message from [name] sent at 12/07/2021  9:29 AM EST ----- Regarding: pain med refill Contact: [phone] Oxy 5mg  put it on hold and he will pick up on Saturday.  [facility] [address]  He is scheduled for his kypho next Tuesday.

## 2021-12-07 NOTE — Telephone Encounter (Signed)
Hang on to this, I will call medicaid today and check about getting more tablets approved.

## 2021-12-09 ENCOUNTER — Telehealth: Payer: Self-pay | Admitting: Orthopedic Surgery

## 2021-12-09 DIAGNOSIS — S22080A Wedge compression fracture of T11-T12 vertebra, initial encounter for closed fracture: Secondary | ICD-10-CM

## 2021-12-09 MED ORDER — OXYCODONE HCL 5 MG PO TABS: 5.0000 mg | ORAL_TABLET | Freq: Four times a day (QID) | ORAL | 0 refills | Status: DC | PRN

## 2021-12-09 NOTE — Telephone Encounter (Signed)
Patient has been notified of med refill and that we have not heard back from insurance about a 10 day supply. He had not other questions.

## 2021-12-09 NOTE — Telephone Encounter (Signed)
We have not heard back from insurance about doing a 10 day supply.   He will be out of oxycodone after today. PMP reviewed and is appropriate.   Refill of oxycodone sent to pharmacy to fill 12/10/21. [facility] [address] at his request.   He is scheduled for kyphoplasty on Tuesday.

## 2021-12-09 NOTE — Telephone Encounter (Signed)
-----   Message from Drake Leach, [place] sent at 12/09/2021  8:27 AM EST ----- Send to [facility] on [address].   ----- Message ----- From: Drake Leach, PA-C Sent: 12/09/2021  12:00 AM EST To: Drake Leach, PA-C  Send refill of oxycodone on Friday to fill Saturday.   Can we do 10 day supply?   I have spoke with Tammi Sou at [facility] about getting 40 pills approved. This has gotten sent to the pharmacist for review. We will receive a notification about the outcome.    Ref# [id number]

## 2021-12-13 ENCOUNTER — Ambulatory Visit
Admission: RE | Admit: 2021-12-13 | Discharge: 2021-12-13 | Disposition: A | Discharge status: Home / Self Care | Payer: Medicaid Other | Source: Ambulatory Visit | Attending: Orthopedic Surgery | Admitting: Orthopedic Surgery

## 2021-12-13 DIAGNOSIS — S22080A Wedge compression fracture of T11-T12 vertebra, initial encounter for closed fracture: Secondary | ICD-10-CM

## 2021-12-13 HISTORY — PX: IR KYPHO THORACIC WITH BONE BIOPSY: IMG5518

## 2021-12-13 MED ORDER — VANCOMYCIN HCL IN DEXTROSE 1-5 GM/200ML-% IV SOLN: 1000.0000 mg | INTRAVENOUS | Status: DC

## 2021-12-13 MED ORDER — ACETAMINOPHEN 10 MG/ML IV SOLN: 1000.0000 mg | Freq: Once | INTRAVENOUS | Status: DC

## 2021-12-13 MED ORDER — FENTANYL CITRATE PF 50 MCG/ML IJ SOSY
25.0000 ug | PREFILLED_SYRINGE | INTRAMUSCULAR | Status: DC | PRN
  Administered 2021-12-13 (×2): 50 ug via INTRAVENOUS

## 2021-12-13 MED ORDER — MIDAZOLAM HCL 2 MG/2ML IJ SOLN
1.0000 mg | INTRAMUSCULAR | Status: DC | PRN
  Administered 2021-12-13 (×2): 1 mg via INTRAVENOUS
  Administered 2021-12-13: 2 mg via INTRAVENOUS
  Administered 2021-12-13: 1 mg via INTRAVENOUS

## 2021-12-13 MED ORDER — CEFAZOLIN SODIUM-DEXTROSE 2-4 GM/100ML-% IV SOLN
2.0000 g | Freq: Once | INTRAVENOUS | Status: AC
  Administered 2021-12-13: 2 g via INTRAVENOUS

## 2021-12-13 MED ORDER — SODIUM CHLORIDE 0.9 % IV SOLN: INTRAVENOUS | Status: DC

## 2021-12-13 NOTE — Progress Notes (Signed)
Pt back in nursing recovery area. Pt still drowsy from procedure but will wake up when spoken to. Pt follows commands, talks in complete sentences and has no complaints at this time. Pt will remain in nursing station until discharge.  ?

## 2021-12-13 NOTE — Discharge Instructions (Addendum)
Kyphoplasty Post Procedure Discharge Instructions  May resume a regular diet and any medications that you routinely take (including pain medications). However, if you are taking Aspirin or an anticoagulant/blood thinner you will be told when you can resume taking these by the healthcare provider. No driving day of procedure. The day of your procedure take it easy. You may use an ice pack as needed to injection sites on back.  Ice to back 30 minutes on and 30 minutes off, as needed. May remove bandaids tomorrow after taking a shower. Replace daily with a clean bandaid until healed.  Do not lift anything heavier than a milk jug for 1-2 weeks or determined by your physician.  Follow up with your physician in 2 weeks.    Please contact our office at [phone] for the following symptoms or if you have any questions:  Fever greater than 100 degrees Increased swelling, pain, or redness at injection site. Increased back and/or leg pain New numbness or change in symptoms from before the procedure.    Thank you for visiting [facility].  May restart your Eliquis in 24 hours after procedure.

## 2021-12-14 ENCOUNTER — Telehealth: Payer: Self-pay

## 2021-12-14 DIAGNOSIS — S22080A Wedge compression fracture of T11-T12 vertebra, initial encounter for closed fracture: Secondary | ICD-10-CM

## 2021-12-14 MED ORDER — TIZANIDINE HCL 2 MG PO TABS: ORAL_TABLET | ORAL | 0 refills | Status: DC

## 2021-12-14 MED ORDER — OXYCODONE HCL 5 MG PO TABS: 5.0000 mg | ORAL_TABLET | Freq: Four times a day (QID) | ORAL | 0 refills | Status: AC | PRN

## 2021-12-14 MED ORDER — CEFAZOLIN SODIUM 500 MG IM: 500.0000 mg | Freq: Three times a day (TID) | INTRAMUSCULAR | Status: DC

## 2021-12-14 MED ORDER — OXYCODONE HCL 5 MG PO TABS: 5.0000 mg | ORAL_TABLET | Freq: Four times a day (QID) | ORAL | 0 refills | Status: DC | PRN

## 2021-12-14 MED ORDER — CLINDAMYCIN HCL 300 MG PO CAPS: 300.0000 mg | ORAL_CAPSULE | Freq: Three times a day (TID) | ORAL | Status: DC

## 2021-12-14 NOTE — Telephone Encounter (Signed)
Patient notified of med refills and when to pick them up.

## 2021-12-14 NOTE — Telephone Encounter (Signed)
-----   Message from [name] sent at 12/14/2021  9:13 AM EST ----- Regarding: med refill Contact: [phone] Oxycodone Tizanidine [facility] [address] Can he get this refill for the last time. His surgery went well.

## 2021-12-14 NOTE — Telephone Encounter (Signed)
Notified [facility] in [place] to cancel rx. Please resend to [facility] on [address] in [place]. Please also send tizanidine. Thanks

## 2021-12-14 NOTE — Telephone Encounter (Signed)
Zanaflex can be filled on 11/24. Can send.   Please [facility].   Let me know when this is done and I'll send it [facility] [address] with the zanaflex.

## 2021-12-14 NOTE — Telephone Encounter (Signed)
PMP reviewed. Last refill on here was 11/13. He was given 5 day supply on 11/18.   He would be due for refill of oxycodone tomorrow. Will do today, but he cannot get until tomorrow.   I am glad he is feeling better!  Please let him know.

## 2021-12-14 NOTE — Telephone Encounter (Signed)
Can you send to [facility]. Was the tizanidine also refilled?

## 2021-12-14 NOTE — Telephone Encounter (Signed)
Pt. Reporting fever post kyphoplasty. Dr. Archer Asa made aware and ordered pt. Antibiotic. Pt. Is to take 2tablets of 300mg  clindamycin tablets for first dose. Then 1 tablet every 8 hours for 7 days. Pt. Made aware and verbalized understanding to call back if any other concerns arise.

## 2021-12-14 NOTE — Addendum Note (Signed)
Addended by: Drake Leach on: 12/14/2021 12:49 PM   Modules accepted: Orders

## 2021-12-14 NOTE — Telephone Encounter (Signed)
[facility] [address]- can pick up tomorrow.   [facility]lex [facility] [address]- can pick up Friday.   Please let him know.

## 2021-12-15 ENCOUNTER — Observation Stay: Payer: Medicaid Other

## 2021-12-15 ENCOUNTER — Emergency Department: Payer: Medicaid Other

## 2021-12-15 ENCOUNTER — Encounter: Payer: Self-pay | Admitting: Emergency Medicine

## 2021-12-15 ENCOUNTER — Observation Stay
Admission: EM | Admit: 2021-12-15 | Discharge: 2021-12-16 | Disposition: A | Discharge status: Home / Self Care | Payer: Medicaid Other | Attending: Internal Medicine | Admitting: Internal Medicine

## 2021-12-15 ENCOUNTER — Other Ambulatory Visit: Payer: Self-pay

## 2021-12-15 DIAGNOSIS — N179 Acute kidney failure, unspecified: Principal | ICD-10-CM | POA: Diagnosis present

## 2021-12-15 DIAGNOSIS — J45909 Unspecified asthma, uncomplicated: Secondary | ICD-10-CM | POA: Diagnosis present

## 2021-12-15 DIAGNOSIS — R7989 Other specified abnormal findings of blood chemistry: Secondary | ICD-10-CM | POA: Diagnosis not present

## 2021-12-15 DIAGNOSIS — I10 Essential (primary) hypertension: Secondary | ICD-10-CM | POA: Diagnosis not present

## 2021-12-15 DIAGNOSIS — Z79899 Other long term (current) drug therapy: Secondary | ICD-10-CM | POA: Diagnosis not present

## 2021-12-15 DIAGNOSIS — Z86711 Personal history of pulmonary embolism: Secondary | ICD-10-CM | POA: Diagnosis present

## 2021-12-15 DIAGNOSIS — Z1152 Encounter for screening for COVID-19: Secondary | ICD-10-CM | POA: Insufficient documentation

## 2021-12-15 DIAGNOSIS — F329 Major depressive disorder, single episode, unspecified: Secondary | ICD-10-CM | POA: Diagnosis present

## 2021-12-15 DIAGNOSIS — R42 Dizziness and giddiness: Secondary | ICD-10-CM

## 2021-12-15 DIAGNOSIS — Z7901 Long term (current) use of anticoagulants: Secondary | ICD-10-CM | POA: Diagnosis not present

## 2021-12-15 DIAGNOSIS — R7401 Elevation of levels of liver transaminase levels: Secondary | ICD-10-CM | POA: Diagnosis present

## 2021-12-15 DIAGNOSIS — M6282 Rhabdomyolysis: Secondary | ICD-10-CM | POA: Diagnosis present

## 2021-12-15 DIAGNOSIS — I959 Hypotension, unspecified: Secondary | ICD-10-CM

## 2021-12-15 DIAGNOSIS — F988 Other specified behavioral and emotional disorders with onset usually occurring in childhood and adolescence: Secondary | ICD-10-CM | POA: Diagnosis present

## 2021-12-15 DIAGNOSIS — R509 Fever, unspecified: Secondary | ICD-10-CM | POA: Diagnosis present

## 2021-12-15 DIAGNOSIS — D696 Thrombocytopenia, unspecified: Secondary | ICD-10-CM | POA: Diagnosis not present

## 2021-12-15 LAB — CBC WITH DIFFERENTIAL/PLATELET
Abs Immature Granulocytes: 0.01 10*3/uL (ref 0.00–0.07)
Basophils Absolute: 0 10*3/uL (ref 0.0–0.1)
Basophils Relative: 0 %
Eosinophils Absolute: 0.6 10*3/uL — ABNORMAL HIGH (ref 0.0–0.5)
Eosinophils Relative: 13 %
HCT: 39.1 % (ref 39.0–52.0)
Hemoglobin: 13.6 g/dL (ref 13.0–17.0)
Immature Granulocytes: 0 %
Lymphocytes Relative: 18 %
Lymphs Abs: 0.9 10*3/uL (ref 0.7–4.0)
MCH: 31.1 pg (ref 26.0–34.0)
MCHC: 34.8 g/dL (ref 30.0–36.0)
MCV: 89.3 fL (ref 80.0–100.0)
Monocytes Absolute: 0.3 10*3/uL (ref 0.1–1.0)
Monocytes Relative: 7 %
Neutro Abs: 3 10*3/uL (ref 1.7–7.7)
Neutrophils Relative %: 62 %
Platelets: 114 10*3/uL — ABNORMAL LOW (ref 150–400)
RBC: 4.38 MIL/uL (ref 4.22–5.81)
RDW: 12.8 % (ref 11.5–15.5)
WBC: 4.8 10*3/uL (ref 4.0–10.5)
nRBC: 0 % (ref 0.0–0.2)

## 2021-12-15 LAB — URINALYSIS, ROUTINE W REFLEX MICROSCOPIC
Bacteria, UA: NONE SEEN
Bilirubin Urine: NEGATIVE
Glucose, UA: NEGATIVE mg/dL
Ketones, ur: NEGATIVE mg/dL
Leukocytes,Ua: NEGATIVE
Nitrite: NEGATIVE
Protein, ur: 100 mg/dL — AB
Specific Gravity, Urine: 1.02 (ref 1.005–1.030)
pH: 5 (ref 5.0–8.0)

## 2021-12-15 LAB — COMPREHENSIVE METABOLIC PANEL
ALT: 406 U/L — ABNORMAL HIGH (ref 0–44)
AST: 746 U/L — ABNORMAL HIGH (ref 15–41)
Albumin: 4.4 g/dL (ref 3.5–5.0)
Alkaline Phosphatase: 51 U/L (ref 38–126)
Anion gap: 11 (ref 5–15)
BUN: 23 mg/dL — ABNORMAL HIGH (ref 6–20)
CO2: 25 mmol/L (ref 22–32)
Calcium: 8.7 mg/dL — ABNORMAL LOW (ref 8.9–10.3)
Chloride: 99 mmol/L (ref 98–111)
Creatinine, Ser: 1.91 mg/dL — ABNORMAL HIGH (ref 0.61–1.24)
GFR, Estimated: 42 mL/min — ABNORMAL LOW (ref >60)
Glucose, Bld: 105 mg/dL — ABNORMAL HIGH (ref 70–99)
Potassium: 3.5 mmol/L (ref 3.5–5.1)
Sodium: 135 mmol/L (ref 135–145)
Total Bilirubin: 1.4 mg/dL — ABNORMAL HIGH (ref 0.3–1.2)
Total Protein: 8.3 g/dL — ABNORMAL HIGH (ref 6.5–8.1)

## 2021-12-15 LAB — RESP PANEL BY RT-PCR (FLU A&B, COVID) ARPGX2
Influenza A by PCR: NEGATIVE
Influenza B by PCR: NEGATIVE
SARS Coronavirus 2 by RT PCR: NEGATIVE

## 2021-12-15 LAB — LACTIC ACID, PLASMA: Lactic Acid, Venous: 1.1 mmol/L (ref 0.5–1.9)

## 2021-12-15 LAB — PROCALCITONIN: Procalcitonin: 1.96 ng/mL

## 2021-12-15 LAB — CK: Total CK: 760 U/L — ABNORMAL HIGH (ref 49–397)

## 2021-12-15 LAB — LIPASE, BLOOD: Lipase: 32 U/L (ref 11–51)

## 2021-12-15 MED ORDER — OXYCODONE HCL 5 MG PO TABS
5.0000 mg | ORAL_TABLET | Freq: Once | ORAL | Status: AC
  Administered 2021-12-15: 5 mg via ORAL
  Filled 2021-12-15: qty 1

## 2021-12-15 MED ORDER — SODIUM CHLORIDE 0.9 % IV BOLUS
500.0000 mL | Freq: Once | INTRAVENOUS | Status: AC
  Administered 2021-12-15: 500 mL via INTRAVENOUS

## 2021-12-15 MED ORDER — SODIUM CHLORIDE 0.9 % IV SOLN: INTRAVENOUS | Status: DC

## 2021-12-15 MED ORDER — PANCRELIPASE (LIP-PROT-AMYL) 12000-38000 UNITS PO CPEP
72000.0000 [IU] | ORAL_CAPSULE | Freq: Three times a day (TID) | ORAL | Status: DC
  Administered 2021-12-15 – 2021-12-16 (×3): 72000 [IU] via ORAL
  Filled 2021-12-15 (×2): qty 6
  Filled 2021-12-15: qty 2

## 2021-12-15 MED ORDER — LACTATED RINGERS IV BOLUS
1000.0000 mL | Freq: Once | INTRAVENOUS | Status: AC
  Administered 2021-12-15: 1000 mL via INTRAVENOUS

## 2021-12-15 MED ORDER — ONDANSETRON HCL 4 MG/2ML IJ SOLN
4.0000 mg | Freq: Once | INTRAMUSCULAR | Status: AC
  Administered 2021-12-15: 4 mg via INTRAVENOUS
  Filled 2021-12-15: qty 2

## 2021-12-15 MED ORDER — ONDANSETRON HCL 4 MG/2ML IJ SOLN: 4.0000 mg | Freq: Four times a day (QID) | INTRAMUSCULAR | Status: DC | PRN

## 2021-12-15 MED ORDER — VANCOMYCIN HCL IN DEXTROSE 1-5 GM/200ML-% IV SOLN: 1000.0000 mg | Freq: Once | INTRAVENOUS | Status: DC

## 2021-12-15 MED ORDER — BUDESONIDE-FORMOTEROL FUMARATE 80-4.5 MCG/ACT IN AERO
2.0000 | INHALATION_SPRAY | Freq: Two times a day (BID) | RESPIRATORY_TRACT | Status: DC
  Filled 2021-12-15: qty 6.9

## 2021-12-15 MED ORDER — APIXABAN 2.5 MG PO TABS
2.5000 mg | ORAL_TABLET | Freq: Two times a day (BID) | ORAL | Status: DC
  Administered 2021-12-15 – 2021-12-16 (×2): 2.5 mg via ORAL
  Filled 2021-12-15 (×3): qty 1

## 2021-12-15 MED ORDER — OXYCODONE HCL 5 MG PO TABS
5.0000 mg | ORAL_TABLET | Freq: Four times a day (QID) | ORAL | Status: DC | PRN
  Administered 2021-12-15 – 2021-12-16 (×4): 5 mg via ORAL
  Filled 2021-12-15 (×4): qty 1

## 2021-12-15 MED ORDER — VANCOMYCIN HCL IN DEXTROSE 1-5 GM/200ML-% IV SOLN
1000.0000 mg | Freq: Once | INTRAVENOUS | Status: AC
  Administered 2021-12-15: 1000 mg via INTRAVENOUS
  Filled 2021-12-15: qty 200

## 2021-12-15 MED ORDER — ONDANSETRON HCL 4 MG PO TABS
4.0000 mg | ORAL_TABLET | Freq: Four times a day (QID) | ORAL | Status: DC | PRN
  Administered 2021-12-16: 4 mg via ORAL
  Filled 2021-12-15: qty 1

## 2021-12-15 MED ORDER — VITAMIN B-12 100 MCG PO TABS
100.0000 ug | ORAL_TABLET | Freq: Every day | ORAL | Status: DC
  Administered 2021-12-16: 100 ug via ORAL
  Filled 2021-12-15: qty 1

## 2021-12-15 MED ORDER — DEXMETHYLPHENIDATE HCL ER 20 MG PO CP24
20.0000 mg | ORAL_CAPSULE | Freq: Every evening | ORAL | Status: DC
  Administered 2021-12-15: 20 mg via ORAL
  Filled 2021-12-15: qty 4

## 2021-12-15 MED ORDER — THIAMINE HCL 100 MG PO TABS: 50.0000 mg | ORAL_TABLET | Freq: Every day | ORAL | Status: DC

## 2021-12-15 MED ORDER — PIPERACILLIN-TAZOBACTAM 3.375 G IVPB 30 MIN
3.3750 g | Freq: Once | INTRAVENOUS | Status: AC
  Administered 2021-12-15: 3.375 g via INTRAVENOUS
  Filled 2021-12-15: qty 50

## 2021-12-15 MED ORDER — ACETAMINOPHEN 650 MG RE SUPP: 650.0000 mg | Freq: Four times a day (QID) | RECTAL | Status: DC | PRN

## 2021-12-15 MED ORDER — CLONAZEPAM 1 MG PO TABS
1.0000 mg | ORAL_TABLET | Freq: Three times a day (TID) | ORAL | Status: DC | PRN
  Administered 2021-12-15 – 2021-12-16 (×3): 1 mg via ORAL
  Filled 2021-12-15 (×2): qty 1
  Filled 2021-12-15: qty 2

## 2021-12-15 MED ORDER — PANTOPRAZOLE SODIUM 40 MG PO TBEC
40.0000 mg | DELAYED_RELEASE_TABLET | Freq: Every day | ORAL | Status: DC
  Administered 2021-12-16: 40 mg via ORAL
  Filled 2021-12-15: qty 1

## 2021-12-15 MED ORDER — SERTRALINE HCL 50 MG PO TABS
100.0000 mg | ORAL_TABLET | Freq: Two times a day (BID) | ORAL | Status: DC
  Administered 2021-12-15 – 2021-12-16 (×2): 100 mg via ORAL
  Filled 2021-12-15 (×2): qty 2

## 2021-12-15 MED ORDER — VANCOMYCIN HCL 1500 MG/300ML IV SOLN
1500.0000 mg | Freq: Once | INTRAVENOUS | Status: AC
  Administered 2021-12-15: 1500 mg via INTRAVENOUS
  Filled 2021-12-15: qty 300

## 2021-12-15 MED ORDER — MOMETASONE FURO-FORMOTEROL FUM 100-5 MCG/ACT IN AERO
2.0000 | INHALATION_SPRAY | Freq: Two times a day (BID) | RESPIRATORY_TRACT | Status: DC
  Administered 2021-12-15 – 2021-12-16 (×2): 2 via RESPIRATORY_TRACT
  Filled 2021-12-15: qty 8.8

## 2021-12-15 MED ORDER — ALBUTEROL SULFATE (2.5 MG/3ML) 0.083% IN NEBU: 2.5000 mg | INHALATION_SOLUTION | RESPIRATORY_TRACT | Status: DC | PRN

## 2021-12-15 MED ORDER — DEXMETHYLPHENIDATE HCL ER 5 MG PO CP24
30.0000 mg | ORAL_CAPSULE | Freq: Every morning | ORAL | Status: DC
  Administered 2021-12-16: 30 mg via ORAL
  Filled 2021-12-15 (×2): qty 6

## 2021-12-15 MED ORDER — MORPHINE SULFATE (PF) 4 MG/ML IV SOLN
4.0000 mg | Freq: Once | INTRAVENOUS | Status: AC
  Administered 2021-12-15: 4 mg via INTRAVENOUS
  Filled 2021-12-15: qty 1

## 2021-12-15 MED ORDER — ACETAMINOPHEN 325 MG PO TABS
650.0000 mg | ORAL_TABLET | Freq: Four times a day (QID) | ORAL | Status: DC | PRN
  Administered 2021-12-15: 650 mg via ORAL
  Filled 2021-12-15: qty 2

## 2021-12-15 NOTE — ED Triage Notes (Signed)
Patient ambulatory to triage with steady gait, without difficulty or distress noted; pt reports surgery on Tuesday for kyphoplasty; received a shingles injection on Saturday; c/o dizziness tonight; no meds taken PTA

## 2021-12-15 NOTE — ED Notes (Signed)
Called Lab to let them know we are still waiting for someone to come down to collect a second set of blood cultures on patient.

## 2021-12-15 NOTE — ED Notes (Signed)
Pharmacy notified to verify pts home meds.

## 2021-12-15 NOTE — Assessment & Plan Note (Signed)
Unclear etiology but worse when compared to patient's baseline History of alcohol abuse in the past but states that he has been sober for about 6 weeks Worsening transaminitis may be related to rhabdomyolysis Hold Tricor and atorvastatin Obtain right upper quadrant ultrasound

## 2021-12-15 NOTE — Assessment & Plan Note (Signed)
With no evidence of bleeding but patient is on apixaban Monitor closely during this hospitalization

## 2021-12-15 NOTE — Assessment & Plan Note (Signed)
Stable and not acutely exacerbated Continue as needed bronchodilator therapy and inhaled steroids 

## 2021-12-15 NOTE — Assessment & Plan Note (Signed)
Continue dexmethylphenidate

## 2021-12-15 NOTE — Assessment & Plan Note (Signed)
Continue sertraline 

## 2021-12-15 NOTE — Progress Notes (Signed)
PHARMACY -  BRIEF ANTIBIOTIC NOTE   Pharmacy has received consult(s) for Vancomycin from an ED provider.  The patient's profile has been reviewed for ht/wt/allergies/indication/available labs.    One time order(s) placed for Vancomycin 2500 mg IV X 1.   Further antibiotics/pharmacy consults should be ordered by admitting physician if indicated.                       Thank you, Greidys Deland D 12/15/2021  6:41 AM

## 2021-12-15 NOTE — Assessment & Plan Note (Signed)
Continue apixaban 

## 2021-12-15 NOTE — Assessment & Plan Note (Addendum)
Patient is status post recent kyphoplasty for T12 compression fracture following a fall which occurred on 11/04/21.  No other falls since then Noted to have elevated total CK levels Continue IV fluid hydration Repeat CK levels in a.m.

## 2021-12-15 NOTE — Assessment & Plan Note (Signed)
Patient presents with complaints of fever following kyphoplasty which was done as an outpatient with a Tmax of 102 He remains afebrile here in the ER He was hypotensive when he arrived but responded to IV fluid resuscitation. Does not have any other sirs criteria, not tachycardic, not tachypneic and does not have a white count but has elevated procalcitonin levels of 1.96 Received IV vancomycin and Zosyn in the ER We will hold off on further antibiotic therapy and await results of blood cultures

## 2021-12-15 NOTE — ED Notes (Signed)
Attempted IV access x 2 unsuccessful.  Lab called to draw cultures.

## 2021-12-15 NOTE — H&P (Addendum)
History and Physical    Patient: Brian SparkMichael Freeland ZOX:[id number] DOB: 01/18/1971 DOA: 12/15/2021 DOS: the patient was seen and examined on 12/15/2021 PCP: Patient, No Pcp Per  Patient coming from: Home  Chief Complaint:  Chief Complaint  Patient presents with   Dizziness   HPI: Brian [name] is a 51 y.o. male with medical history significant for ADHD, anxiety, depression, hypertension, pulmonary thromboembolism with who presents to the ER for evaluation of dizziness. Patient is s/p kyphoplasty for T12 compression fracture which was done on 12/13/21.  He also notes that he had recently received the Shingrix vaccine a couple of days prior to his procedure.  Postprocedure he developed a fever at home with a Tmax of 101 and said he had flulike symptoms but was unable to elaborate.  He has had poor oral intake since his surgery but denies having any cough, no myalgias, no congestion.  He contacted his neurosurgeon and they called in a prescription for clindamycin which he was unable to pick up.  Patient notes complete resolution of his symptoms post kyphoplasty. He came into the ER on the day of admission with complaints of dizziness and was noted to be hypotensive in the ER with blood pressure 75/57.  He was not febrile when he arrived to ER.  Blood pressure improved with IV fluid resuscitation He denies having any chest pain, no shortness of breath, no abdominal pain, no headache, no changes in his bowel habits, no urinary symptoms, no leg swelling, no blurred vision or focal deficit. Labs show a total CK of 760, BUN of 23 and creatinine of 1.91 compared to baseline of 1.01, he also has markedly elevated liver enzymes.  Procalcitonin was elevated at 1.96 He received IV vancomycin and Zosyn in the ER He will be admitted to the hospital for further evaluation.  Review of Systems: As mentioned in the history of present illness. All other systems reviewed and are negative. Past Medical History:   Diagnosis Date   ADHD    Alcohol abuse    Anxiety    Asthma    Congenital heart defect    Depression    Hyperlipidemia    Hypertension    Pulmonary thromboembolism (HCC)    Seizure (HCC)    Thoracic aortic aneurysm [facility](HCC)    Past Surgical History:  Procedure Laterality Date   artificial aorta and pig valve  02/25/2020   CARDIAC CATHETERIZATION     CHOLECYSTECTOMY  2004   COLONOSCOPY     DENTAL SURGERY     IR KYPHO THORACIC WITH BONE BIOPSY  12/13/2021   IR RADIOLOGIST EVAL & MGMT  11/22/2021   LIVER BIOPSY  2004   Social History:  reports that he has never smoked. He has never used smokeless tobacco. He reports current alcohol use. He reports that he does not use drugs.  Allergies  Allergen Reactions   Metoprolol Anaphylaxis    AFFECTED MOTOR SKILLS Tolerated coreg 03/02/20   Lithium Other (See Comments)    Emotional issues admitted after taking   Valproic Acid Other (See Comments)    hallucinations    Depakote [Divalproex Sodium] Anxiety   Nsaids Other (See Comments)    Gastritis Contraindicated due to Eliquis and Creon    Penicillins Rash    Tolerated Ancef February 2022    Sulfa Antibiotics Rash    History reviewed. No pertinent family history.  Prior to Admission medications   Medication Sig Start Date End Date Taking? Authorizing Provider  albuterol (PROVENTIL HFA;VENTOLIN HFA)  108 (90 Base) MCG/ACT inhaler Inhale 1 puff into the lungs every 4 (four) hours as needed for wheezing or shortness of breath.    [provider]  atorvastatin (LIPITOR) 20 MG tablet Take 20 mg by mouth daily. 11/03/21   [provider]  Budesonide-Formoterol Fumarate (SYMBICORT IN) Inhale 1 puff into the lungs 2 (two) times daily.    [provider]  clonazePAM (KLONOPIN) 1 MG tablet Take 1 tablet by mouth 3 (three) times daily as needed. 02/16/16   [provider]  cyanocobalamin 100 MCG tablet Take by mouth.    [provider]  ELIQUIS  2.5 MG TABS tablet Take 2.5 mg by mouth 2 (two) times daily. 11/06/21   [provider]  fenofibrate (TRICOR) 145 MG tablet Take 145 mg by mouth daily. 06/10/21   [provider]  FOCALIN XR 20 MG 24 hr capsule Take 20 mg by mouth every evening. 10/04/21   [provider]  FOCALIN XR 30 MG CP24 Take 1 capsule by mouth every morning. 11/04/21   [provider]  furosemide (LASIX) 20 MG tablet Take 20 mg by mouth daily. 11/06/21   [provider]  lipase/protease/amylase (CREON) 36000 UNITS CPEP capsule Take 1 capsule by mouth. 7 times a day 07/29/20   [provider]  omeprazole (PRILOSEC) 20 MG capsule Take 20 mg by mouth daily. 11/14/21   [provider]  oxyCODONE (ROXICODONE) 5 MG immediate release tablet Take 1 tablet (5 mg total) by mouth every 6 (six) hours as needed for up to 5 days for severe pain. 12/15/21 12/20/21  Drake Leach, PA-C  sertraline (ZOLOFT) 100 MG tablet Take 100 mg by mouth 2 (two) times daily. 02/16/16   [provider]  thiamine 50 MG tablet Take by mouth.    [provider]  tiZANidine (ZANAFLEX) 2 MG tablet TAKE 1 TABLET(2 MG) BY MOUTH EVERY 8 HOURS AS NEEDED FOR MUSCLE SPASMS 12/16/21   Drake Leach, PA-C  triamcinolone (NASACORT) 55 MCG/ACT AERO nasal inhaler Place into the nose.    [provider]    Physical Exam: Vitals:   12/15/21 0851 12/15/21 0900 12/15/21 0923 12/15/21 1030  BP: (!) 149/94 110/78  137/86  Pulse: 67 63 66 67  Resp: 14 17 17 13   Temp:   98.7 F (37.1 C)   TempSrc:   Oral   SpO2: 97% 96% 98% 98%  Weight:      Height:       Physical Exam Vitals and nursing note reviewed.  Constitutional:      Appearance: Normal appearance.  HENT:     Head: Normocephalic and atraumatic.     Nose: Nose normal.     Mouth/Throat:     Mouth: Mucous membranes are dry.  Eyes:     Conjunctiva/sclera: Conjunctivae normal.  Cardiovascular:     Rate and Rhythm: Normal rate  and regular rhythm.  Pulmonary:     Effort: Pulmonary effort is normal.     Breath sounds: Normal breath sounds.  Abdominal:     General: Abdomen is flat. Bowel sounds are normal.     Palpations: Abdomen is soft.  Musculoskeletal:        General: Normal range of motion.     Cervical back: Normal range of motion and neck supple.  Skin:    General: Skin is warm and dry.     Comments: Steri-Strips on his back from recent surgery with no redness or differential warmth  Neurological:  General: No focal deficit present.     Mental Status: He is alert and oriented to person, place, and time.  Psychiatric:        Mood and Affect: Mood normal.        Behavior: Behavior normal.     Data Reviewed: Relevant notes from primary care and specialist visits, past discharge summaries as available in EHR, including Care Everywhere. Prior diagnostic testing as pertinent to current admission diagnoses Updated medications and problem lists for reconciliation ED course, including vitals, labs, imaging, treatment and response to treatment Triage notes, nursing and pharmacy notes and ED provider's notes Notable results as noted in HPI Labs reviewed.  Total CK7 60, lipase 32, procalcitonin 1.96, sodium 135, potassium 3.5, chloride 99, bicarb 25, glucose 105, BUN 23, creatinine 1.91, calcium 8.7, total protein 8.3, albumin 4.4, AST 746, ALT 406, alkaline phosphatase 51, total bilirubin 1.4, white count 4.8, hemoglobin 13.6, hematocrit 39.1, MCV 89.3, platelet count 114 Chest x-ray reviewed by me shows Mildly lower lung volumes and mild atelectasis at the left lung base. No other acute cardiopulmonary abnormality. MRI thoracic spine shows T12 compression fracture with unresolved marrow edema and interval augmentation. No unexpected findings or adverse features are identified. No new abnormality in the thoracic spine. There are no new results to review at this time.  Assessment and Plan: * AKI (acute  kidney injury) (HCC) Most likely secondary to poor oral intake with concomitant diuretic use Patient was hypotensive upon arrival to the ER with systolic blood pressure of 75 mmHg that responded to IV fluid hydration Serum creatinine on admission was 1.9 compared to baseline of 1.01 Continue IV fluid hydration Hold furosemide Encourage oral intake Repeat renal parameters in a.m.   Transaminitis Unclear etiology but worse when compared to patient's baseline History of alcohol abuse in the past but states that he has been sober for about 6 weeks Worsening transaminitis may be related to rhabdomyolysis Hold Tricor and atorvastatin Obtain right upper quadrant ultrasound  Fever Patient presents with complaints of fever following kyphoplasty which was done as an outpatient with a Tmax of 102 He remains afebrile here in the ER He was hypotensive when he arrived but responded to IV fluid resuscitation. Does not have any other sirs criteria, not tachycardic, not tachypneic and does not have a white count but has elevated procalcitonin levels of 1.96 Received IV vancomycin and Zosyn in the ER We will hold off on further antibiotic therapy and await results of blood cultures  Non-traumatic rhabdomyolysis Patient is status post recent kyphoplasty for T12 compression fracture following a fall which occurred on 11/04/21.  No other falls since then Noted to have elevated total CK levels Continue IV fluid hydration Repeat CK levels in a.m.  Attention deficit disorder Continue dexmethylphenidate  Asthma without status asthmaticus Stable and not acutely exacerbated Continue as needed bronchodilator therapy and inhaled steroids  Thrombocytopenia (HCC) With no evidence of bleeding but patient is on apixaban Monitor closely during this hospitalization  Personal history of pulmonary embolism Continue apixaban  Major depressive disorder, single episode Continue sertraline      Advance  Care Planning:   Code Status: Full Code   Consults: None  Family Communication: Greater than 50% of time was spent discussing patient's condition and plan of care with him in detail.  He verbalizes understanding and agrees with the plan.  Severity of Illness: The appropriate patient status for this patient is OBSERVATION. Observation status is judged to be reasonable and necessary in  order to provide the required intensity of service to ensure the patient's safety. The patient's presenting symptoms, physical exam findings, and initial radiographic and laboratory data in the context of their medical condition is felt to place them at decreased risk for further clinical deterioration. Furthermore, it is anticipated that the patient will be medically stable for discharge from the hospital within 2 midnights of admission.   Author: Lucile Shutters, MD 12/15/2021 12:52 PM  For on call review [email or web address].

## 2021-12-15 NOTE — ED Notes (Signed)
Patient currently in MRI at start of this RN shift. Delay in antibiotic start.

## 2021-12-15 NOTE — ED Provider Notes (Signed)
8:02 AM Assumed care for off going team.   Blood pressure (!) 129/90, pulse 63, temperature 97.8 F (36.6 C), temperature source Oral, resp. rate 17, height 6\' 1"  (1.854 m), weight 99.8 kg, SpO2 97 %.  See their HPI for full report but in brief pending MRI   IMPRESSION: 1. T12 compression fracture with unresolved marrow edema and interval augmentation. No unexpected findings or adverse features are identified.   2. No new abnormality in the thoracic spine.    I did discuss with the radiologist who read the study and this is as expected on resolved marrow edema and this looks was 1 would expect no signs of any infection post kyphoplasty.  8:56 AM  Reevaluated patient and discussed admission versus discharge.  We discussed that the safest option would be admission given the concern for new AKI, elevated LFTs, hypotension as well as possible bacteremia.  We discussed the procalcitonin and it being elevated and this could mean an infection in the bloodstream. However could be secondary to surgery and.  We discussed a lot of more depends on how he is feeling.  After further consideration patient is opted to want to stay in the hospital for blood culture rule out and rehydration and will hopefully be discharged tomorrow if blood cultures are negative.            , MD 12/15/21 [phone]

## 2021-12-15 NOTE — Assessment & Plan Note (Signed)
Most likely secondary to poor oral intake with concomitant diuretic use Patient was hypotensive upon arrival to the ER with systolic blood pressure of 75 mmHg that responded to IV fluid hydration Serum creatinine on admission was 1.9 compared to baseline of 1.01 Continue IV fluid hydration Hold furosemide Encourage oral intake Repeat renal parameters in a.m.

## 2021-12-15 NOTE — ED Notes (Signed)
Pharmacy notified to med rec pts home meds. Pt requesting home meds STAT.

## 2021-12-15 NOTE — ED Provider Notes (Signed)
[facility] Provider Note    Event Date/Time   First MD Initiated Contact with Patient 12/15/21 0201     (approximate)   History   Dizziness   HPI  Brian [name] is a 51 y.o. male who presents to the ED from home with a chief complaint of dizziness.  Patient had kyphoplasty by interventional radiology on 12/13/2021 for T12 compression fracture.  Received shingles injection on 12/10/2021.  Called his doctor yesterday for fever of 102 F at home.  He was prescribed clindamycin 300 mg, 2 tablets for the first dose, then 1 tablet every 8 hours for 7 days.  Denies cough, chest pain, shortness of breath, abdominal/back pain, nausea, vomiting, dysuria or diarrhea.  Reports 100% relief of back pain since procedure.     Past Medical History   Past Medical History:  Diagnosis Date   ADHD    Alcohol abuse    Anxiety    Asthma    Congenital heart defect    Depression    Hyperlipidemia    Hypertension    Pulmonary thromboembolism (HCC)    Seizure (HCC)    Thoracic aortic aneurysm [facility](HCC)      Active Problem List   Patient Active Problem List   Diagnosis Date Noted   Hyperlipidemia, unspecified 11/22/2021   History of aortic root repair 07/08/2021   Current use of long term anticoagulation 10/14/2020   Orthostatic hypotension 04/29/2020   Palpitations 04/28/2020   Mild intermittent asthma without complication 03/10/2020   Personal history of pulmonary embolism 03/10/2020   Aortic aneurysm without rupture (HCC) 03/10/2020   Violent behavior 03/04/2020   History of aortic valve replacement 02/25/2020   Ascending aortic aneurysm (HCC) 02/25/2020   Thrombocytopenia (HCC) 11/21/2019   Hypertriglyceridemia 12/29/2017   Drug-seeking behavior 07/23/2017   Essential (primary) hypertension 04/05/2017   Alcoholic fatty liver 04/13/2015   Alcohol-induced acute pancreatitis without infection or necrosis 04/13/2015   Major depressive disorder, single episode  11/06/2013   Mixed hyperlipidemia 11/06/2013   Asthma without status asthmaticus 11/06/2013   Other specified behavioral and emotional disorders with onset usually occurring in childhood and adolescence 11/06/2013   Anxiety and depression 11/06/2013   Alcoholic hepatitis without ascites 11/06/2013   Attention deficit disorder 11/06/2013   Nondependent alcohol abuse, continuous drinking behavior 07/16/2013   Vitamin D deficiency 03/20/2012     Past Surgical History   Past Surgical History:  Procedure Laterality Date   artificial aorta and pig valve  02/25/2020   CARDIAC CATHETERIZATION     CHOLECYSTECTOMY  2004   COLONOSCOPY     DENTAL SURGERY     IR KYPHO THORACIC WITH BONE BIOPSY  12/13/2021   IR RADIOLOGIST EVAL & MGMT  11/22/2021   LIVER BIOPSY  2004     Home Medications   Prior to Admission medications   Medication Sig Start Date End Date Taking? Authorizing Provider  albuterol (PROVENTIL HFA;VENTOLIN HFA) 108 (90 Base) MCG/ACT inhaler Inhale 1 puff into the lungs every 4 (four) hours as needed for wheezing or shortness of breath.    [provider]  atorvastatin (LIPITOR) 20 MG tablet Take 20 mg by mouth daily. 11/03/21   [provider]  Budesonide-Formoterol Fumarate (SYMBICORT IN) Inhale 1 puff into the lungs 2 (two) times daily.    [provider]  clonazePAM (KLONOPIN) 1 MG tablet Take 1 tablet by mouth 3 (three) times daily as needed. 02/16/16   [provider]  cyanocobalamin 100 MCG tablet Take  by mouth.    [provider]  ELIQUIS 2.5 MG TABS tablet Take 2.5 mg by mouth 2 (two) times daily. 11/06/21   [provider]  fenofibrate (TRICOR) 145 MG tablet Take 145 mg by mouth daily. 06/10/21   [provider]  FOCALIN XR 20 MG 24 hr capsule Take 20 mg by mouth every evening. 10/04/21   [provider]  FOCALIN XR 30 MG CP24 Take 1 capsule by mouth every morning. 11/04/21   [provider]   furosemide (LASIX) 20 MG tablet Take 20 mg by mouth daily. 11/06/21   [provider]  lipase/protease/amylase (CREON) 36000 UNITS CPEP capsule Take 1 capsule by mouth. 7 times a day 07/29/20   [provider]  omeprazole (PRILOSEC) 20 MG capsule Take 20 mg by mouth daily. 11/14/21   [provider]  oxyCODONE (ROXICODONE) 5 MG immediate release tablet Take 1 tablet (5 mg total) by mouth every 6 (six) hours as needed for up to 5 days for severe pain. 12/15/21 12/20/21  Drake Leach, PA-C  sertraline (ZOLOFT) 100 MG tablet Take 100 mg by mouth 2 (two) times daily. 02/16/16   [provider]  thiamine 50 MG tablet Take by mouth.    [provider]  tiZANidine (ZANAFLEX) 2 MG tablet TAKE 1 TABLET(2 MG) BY MOUTH EVERY 8 HOURS AS NEEDED FOR MUSCLE SPASMS 12/16/21   Drake Leach, PA-C  triamcinolone (NASACORT) 55 MCG/ACT AERO nasal inhaler Place into the nose.    [provider]     Allergies  Metoprolol, Lithium, Valproic acid, Depakote [divalproex sodium], Nsaids, Penicillins, and Sulfa antibiotics   Family History  History reviewed. No pertinent family history.   Physical Exam  Triage Vital Signs: ED Triage Vitals  Enc Vitals Group     BP 12/15/21 0148 (!) 75/57     Pulse Rate 12/15/21 0148 67     Resp 12/15/21 0148 18     Temp 12/15/21 0148 (!) 97.4 F (36.3 C)     Temp src --      SpO2 12/15/21 0148 97 %     Weight 12/15/21 0155 220 lb (99.8 kg)     Height 12/15/21 0155 6\' 1"  (1.854 m)     Head Circumference --      Peak Flow --      Pain Score 12/15/21 0155 4     Pain Loc --      Pain Edu? --      Excl. in GC? --     Updated Vital Signs: BP 124/84   Pulse 61   Temp 97.8 F (36.6 C) (Oral)   Resp 16   Ht 6\' 1"  (1.854 m)   Wt 99.8 kg   SpO2 96%   BMI 29.03 kg/m    General: Awake, no distress.  CV:  RRR.  Good peripheral perfusion.  Resp:  Normal effort.  CTA B. Abd:  Nontender to light or deep palpation.  No  distention.  Other:  Postop sites clean/dry/intact.  No spinal tenderness.  No warmth/erythema.  No fluctuance.  No abscess.   ED Results / Procedures / Treatments  Labs (all labs ordered are listed, but only abnormal results are displayed) Labs Reviewed  CBC WITH DIFFERENTIAL/PLATELET - Abnormal; Notable for the following components:      Result Value   Platelets 114 (*)    Eosinophils Absolute 0.6 (*)    All other components within normal limits  COMPREHENSIVE METABOLIC PANEL - Abnormal; Notable  for the following components:   Glucose, Bld 105 (*)    BUN 23 (*)    Creatinine, Ser 1.91 (*)    Calcium 8.7 (*)    Total Protein 8.3 (*)    AST 746 (*)    ALT 406 (*)    Total Bilirubin 1.4 (*)    GFR, Estimated 42 (*)    All other components within normal limits  URINALYSIS, ROUTINE W REFLEX MICROSCOPIC - Abnormal; Notable for the following components:   Color, Urine AMBER (*)    APPearance CLOUDY (*)    Hgb urine dipstick SMALL (*)    Protein, ur 100 (*)    All other components within normal limits  RESP PANEL BY RT-PCR (FLU A&B, COVID) ARPGX2  CULTURE, BLOOD (ROUTINE X 2)  CULTURE, BLOOD (ROUTINE X 2)  URINE CULTURE  LACTIC ACID, PLASMA  PROCALCITONIN  LIPASE, BLOOD     EKG  ED ECG REPORT I, Janalyn Higby J, the attending physician, personally viewed and interpreted this ECG.   Date: 12/15/2021  EKG Time: 0200  Rate: 60  Rhythm: normal sinus rhythm  Axis: Normal  Intervals:none  ST&T Change: Nonspecific    RADIOLOGY I have independently visualized and interpreted patient's chest x-ray as well as noted the radiology interpretation:  Chest x-ray: No acute cardiopulmonary process  MRI: Pending  Official radiology report(s): DG Chest Port 1 View  Result Date: 12/15/2021 CLINICAL DATA:  51 year old male fever status post kyphoplasty 2 days ago. EXAM: PORTABLE CHEST 1 VIEW COMPARISON:  Fluoroscopic kyphoplasty images [id number]. Chest radiographs 11/04/2021 and  earlier. FINDINGS: Portable AP upright view at 0402 hours. Mildly lower lung volumes compared to October. Stable cardiac size and mediastinal contours. Prior sternotomy. No significant cardiomegaly. Visualized tracheal air column is within normal limits. Mild left lung base atelectasis, mild elevation of the left hemidiaphragm now. But otherwise Allowing for portable technique the lungs are clear. No pneumothorax or pleural effusion. No acute osseous abnormality identified. Paucity of bowel gas in the visible abdomen. IMPRESSION: Mildly lower lung volumes and mild atelectasis at the left lung base. No other acute cardiopulmonary abnormality. Electronically Signed   By: Odessa Fleming M.D.   On: 12/15/2021 04:52     PROCEDURES:  Critical Care performed: Yes, see critical care procedure note(s)  CRITICAL CARE Performed by: Irean Hong   Total critical care time: 45 minutes  Critical care time was exclusive of separately billable procedures and treating other patients.  Critical care was necessary to treat or prevent imminent or life-threatening deterioration.  Critical care was time spent personally by me on the following activities: development of treatment plan with patient and/or surrogate as well as nursing, discussions with consultants, evaluation of patient's response to treatment, examination of patient, obtaining history from patient or surrogate, ordering and performing treatments and interventions, ordering and review of laboratory studies, ordering and review of radiographic studies, pulse oximetry and re-evaluation of patient's condition.   Marland Kitchen1-3 Lead EKG Interpretation  Performed by: Irean Hong, MD Authorized by: Irean Hong, MD     Interpretation: normal     ECG rate:  64   ECG rate assessment: normal     Rhythm: sinus rhythm     Ectopy: none     Conduction: normal   Comments:     Patient placed on cardiac monitor to evaluate for arrhythmias    MEDICATIONS ORDERED IN  ED: Medications  piperacillin-tazobactam (ZOSYN) IVPB 3.375 g (has no administration in time range)  vancomycin (VANCOCIN)  IVPB 1000 mg/200 mL premix (has no administration in time range)    Followed by  vancomycin (VANCOREADY) IVPB 1500 mg/300 mL (has no administration in time range)  lactated ringers bolus 1,000 mL (0 mLs Intravenous Stopped 12/15/21 0424)  morphine (PF) 4 MG/ML injection 4 mg (4 mg Intravenous Given 12/15/21 0658)  ondansetron (ZOFRAN) injection 4 mg (4 mg Intravenous Given 12/15/21 0657)     IMPRESSION / MDM / ASSESSMENT AND PLAN / ED COURSE  I reviewed the triage vital signs and the nursing notes.                             51 year old male presenting with dizziness.  Hypotensive on arrival.  Recent kyphoplasty as well as shingles vaccination.  Diagnosis includes but is not limited to sepsis, community-acquired pneumonia, viral process such as COVID-19 or influenza, UTI, postoperative infection, etc.  Personally reviewed patient's records and note his kyphoplasty procedure on 12/13/2021 as well is telephone communication from yesterday regarding fever and instructions to take clindamycin.  Patient's presentation is most consistent with acute presentation with potential threat to life or bodily function.  The patient is on the cardiac monitor to evaluate for evidence of arrhythmia and/or significant heart rate changes.  Patient is overall well-appearing despite his hypotension.  We will obtain sepsis protocol work-up.  No indication for back imaging at this time, will consider if work-up is negative.  Initiate IV fluid resuscitation and reassess.  Clinical Course as of 12/15/21 0710  Thu Dec 15, 2021  0540 BP 124/84.  Patient feeling fine, voices no complaints.  Laboratory results demonstrate normal WBC, AKI with creatinine 1.91 which is increased from prior.  Patient also has elevated transaminases elevated from prior but T. bili decreased from 1 month ago and UA  demonstrates 6-10 WBC without leukocytes or nitrites.  Patient does not have abdominal pain or tenderness on examination, does not voice nausea or vomiting.  Will check lipase.  Awaiting results of procalcitonin.  Lactic acid was unremarkable.  Chest x-ray negative for pneumonia. [JS]  [id number] Updated patient on patient's positive procalcitonin.  While patient is feeling fine, I did recommend we proceed with MRI spine to rule out spinal infection status post procedure.  Will hang IV clindamycin. [JS]  [id number] Care transferred to Dr. Fuller Plan at change of shift pending MRI.  [JS]    Clinical Course User Index [JS] Irean Hong, MD     FINAL CLINICAL IMPRESSION(S) / ED DIAGNOSES   Final diagnoses:  Hypotension, unspecified hypotension type  Dizziness  AKI (acute kidney injury) (HCC)  Elevated LFTs     Rx / DC Orders   ED Discharge Orders     None        Note:  This document was prepared using Dragon voice recognition software and may include unintentional dictation errors.   Irean Hong, MD 12/15/21 [phone]

## 2021-12-16 DIAGNOSIS — R42 Dizziness and giddiness: Secondary | ICD-10-CM

## 2021-12-16 DIAGNOSIS — N179 Acute kidney failure, unspecified: Secondary | ICD-10-CM | POA: Diagnosis not present

## 2021-12-16 LAB — URINE CULTURE: Culture: NO GROWTH

## 2021-12-16 LAB — BASIC METABOLIC PANEL
Anion gap: 8 (ref 5–15)
BUN: 13 mg/dL (ref 6–20)
CO2: 26 mmol/L (ref 22–32)
Calcium: 8.2 mg/dL — ABNORMAL LOW (ref 8.9–10.3)
Chloride: 106 mmol/L (ref 98–111)
Creatinine, Ser: 0.77 mg/dL (ref 0.61–1.24)
GFR, Estimated: >60 mL/min (ref >60)
Glucose, Bld: 96 mg/dL (ref 70–99)
Potassium: 3.3 mmol/L — ABNORMAL LOW (ref 3.5–5.1)
Sodium: 140 mmol/L (ref 135–145)

## 2021-12-16 LAB — CK: Total CK: 353 U/L (ref 49–397)

## 2021-12-16 LAB — CBC
HCT: 37 % — ABNORMAL LOW (ref 39.0–52.0)
Hemoglobin: 13 g/dL (ref 13.0–17.0)
MCH: 31.2 pg (ref 26.0–34.0)
MCHC: 35.1 g/dL (ref 30.0–36.0)
MCV: 88.7 fL (ref 80.0–100.0)
Platelets: 107 10*3/uL — ABNORMAL LOW (ref 150–400)
RBC: 4.17 MIL/uL — ABNORMAL LOW (ref 4.22–5.81)
RDW: 12.4 % (ref 11.5–15.5)
WBC: 3.5 10*3/uL — ABNORMAL LOW (ref 4.0–10.5)
nRBC: 0 % (ref 0.0–0.2)

## 2021-12-16 LAB — HEPATIC FUNCTION PANEL
ALT: 310 U/L — ABNORMAL HIGH (ref 0–44)
AST: 417 U/L — ABNORMAL HIGH (ref 15–41)
Albumin: 3.5 g/dL (ref 3.5–5.0)
Alkaline Phosphatase: 49 U/L (ref 38–126)
Bilirubin, Direct: 0.2 mg/dL (ref 0.0–0.2)
Indirect Bilirubin: 0.4 mg/dL (ref 0.3–0.9)
Total Bilirubin: 0.6 mg/dL (ref 0.3–1.2)
Total Protein: 6.9 g/dL (ref 6.5–8.1)

## 2021-12-16 LAB — HIV ANTIBODY (ROUTINE TESTING W REFLEX): HIV Screen 4th Generation wRfx: NONREACTIVE

## 2021-12-16 MED ORDER — TIZANIDINE HCL 2 MG PO TABS: 2.0000 mg | ORAL_TABLET | Freq: Three times a day (TID) | ORAL | Status: DC | PRN

## 2021-12-16 MED ORDER — MOMETASONE FURO-FORMOTEROL FUM 100-5 MCG/ACT IN AERO: 2.0000 | INHALATION_SPRAY | Freq: Two times a day (BID) | RESPIRATORY_TRACT | Status: DC

## 2021-12-16 MED ORDER — FUROSEMIDE 20 MG PO TABS
20.0000 mg | ORAL_TABLET | Freq: Every day | ORAL | Status: DC
  Administered 2021-12-16: 20 mg via ORAL
  Filled 2021-12-16: qty 1

## 2021-12-16 MED ORDER — CLINDAMYCIN HCL 300 MG PO CAPS: 600.0000 mg | ORAL_CAPSULE | Freq: Three times a day (TID) | ORAL | 0 refills | Status: AC

## 2021-12-16 MED ORDER — POTASSIUM CHLORIDE CRYS ER 20 MEQ PO TBCR
40.0000 meq | EXTENDED_RELEASE_TABLET | Freq: Once | ORAL | Status: AC
  Administered 2021-12-16: 40 meq via ORAL
  Filled 2021-12-16: qty 2

## 2021-12-16 MED ORDER — CLINDAMYCIN HCL 150 MG PO CAPS
600.0000 mg | ORAL_CAPSULE | Freq: Three times a day (TID) | ORAL | Status: DC
  Administered 2021-12-16: 600 mg via ORAL
  Filled 2021-12-16: qty 4

## 2021-12-16 MED ORDER — ONDANSETRON 4 MG PO TBDP
8.0000 mg | ORAL_TABLET | Freq: Two times a day (BID) | ORAL | Status: DC
  Administered 2021-12-16: 8 mg via ORAL
  Filled 2021-12-16: qty 2

## 2021-12-16 MED ORDER — DICLOFENAC SODIUM 1 % EX GEL
2.0000 g | Freq: Four times a day (QID) | CUTANEOUS | Status: DC
  Filled 2021-12-16: qty 100

## 2021-12-16 MED ORDER — CARVEDILOL 12.5 MG PO TABS
12.5000 mg | ORAL_TABLET | Freq: Two times a day (BID) | ORAL | Status: DC
  Administered 2021-12-16: 12.5 mg via ORAL
  Filled 2021-12-16: qty 1

## 2021-12-16 MED ORDER — ACETAMINOPHEN 325 MG PO TABS
650.0000 mg | ORAL_TABLET | Freq: Four times a day (QID) | ORAL | Status: DC | PRN
  Administered 2021-12-16: 650 mg via ORAL
  Filled 2021-12-16: qty 2

## 2021-12-16 MED ORDER — DICLOFENAC SODIUM 1 % EX GEL
2.0000 g | Freq: Four times a day (QID) | CUTANEOUS | Status: DC
  Administered 2021-12-16 (×2): 2 g via TOPICAL
  Filled 2021-12-16: qty 100

## 2021-12-16 MED ORDER — ACETAMINOPHEN 650 MG RE SUPP: 650.0000 mg | Freq: Four times a day (QID) | RECTAL | Status: DC | PRN

## 2021-12-16 NOTE — Evaluation (Signed)
Physical Therapy Evaluation & Discharge Patient Details Name: Brian [name] MRN: [id number] DOB: March 01, 1970 Today's Date: 12/16/2021  History of Present Illness  51 y/o male presented to ED on 12/15/21 for dizziness. Recent T12 kyphoplasty on 11/21. Admitted for AKI. PMH: ADHD, HTN, hx of PE, thoracic aortic aneurysm, hx of seizure.  Clinical Impression  Patient admitted with the above. PTA, patient lives alone and has been independent following recent kyphoplasty. Patient continues to function at independent level and demos steady gait with head turns and sudden direction changes. He denies dizziness throughout mobility. No further skilled PT needs identified acutely. No PT follow up recommended at this time. PT will sign off.        Recommendations for follow up therapy are one component of a multi-disciplinary discharge planning process, led by the attending physician.  Recommendations may be updated based on patient status, additional functional criteria and insurance authorization.  Follow Up Recommendations No PT follow up      Assistance Recommended at Discharge None  Patient can return home with the following       Equipment Recommendations None recommended by PT  Recommendations for Other Services       Functional Status Assessment Patient has not had a recent decline in their functional status     Precautions / Restrictions Precautions Precautions: None Precaution Comments: lifting restriction (<10lbs) from kyphoplasty Restrictions Weight Bearing Restrictions: No      Mobility  Bed Mobility Overal bed mobility: Independent                  Transfers Overall transfer level: Independent Equipment used: None                    Ambulation/Gait Ambulation/Gait assistance: Independent Gait Distance (Feet): 400 Feet Assistive device: None Gait Pattern/deviations: WFL(Within Functional Limits)   Gait velocity interpretation: >4.37 ft/sec,  indicative of normal walking speed   General Gait Details: No LOB with head turns, sudden direction changes, and quick turns. Denies dizziness throughout  Stairs            Wheelchair Mobility    Modified Rankin (Stroke Patients Only)       Balance Overall balance assessment: Independent                                           Pertinent Vitals/Pain Pain Assessment Pain Assessment: Faces Faces Pain Scale: Hurts a little bit Pain Location: back Pain Descriptors / Indicators: Discomfort Pain Intervention(s): Monitored during session    Home Living Family/patient expects to be discharged to:: Private residence Living Arrangements: Alone Available Help at Discharge: Family Type of Home: Apartment (loft apartment)       Alternate Therapist, sports of Steps: flight Home Layout: Two level Home Equipment: Grab bars - tub/shower      Prior Function Prior Level of Function : Independent/Modified Independent;Driving                     Hand Dominance        Extremity/Trunk Assessment   Upper Extremity Assessment Upper Extremity Assessment: Overall WFL for tasks assessed    Lower Extremity Assessment Lower Extremity Assessment: Overall WFL for tasks assessed    Cervical / Trunk Assessment Cervical / Trunk Assessment: Back Surgery (recent kyphoplasty 11/21)  Communication   Communication: No difficulties  Cognition Arousal/Alertness: Awake/alert Behavior During  Therapy: WFL for tasks assessed/performed Overall Cognitive Status: Within Functional Limits for tasks assessed                                          General Comments      Exercises     Assessment/Plan    PT Assessment Patient does not need any further PT services  PT Problem List         PT Treatment Interventions      PT Goals (Current goals can be found in the Care Plan section)  Acute Rehab PT Goals Patient Stated Goal: to go home  today PT Goal Formulation: All assessment and education complete, DC therapy    Frequency       Co-evaluation               AM-PAC PT "6 Clicks" Mobility  Outcome Measure Help needed turning from your back to your side while in a flat bed without using bedrails?: None Help needed moving from lying on your back to sitting on the side of a flat bed without using bedrails?: None Help needed moving to and from a bed to a chair (including a wheelchair)?: None Help needed standing up from a chair using your arms (e.g., wheelchair or bedside chair)?: None Help needed to walk in hospital room?: None Help needed climbing 3-5 steps with a railing? : None 6 Click Score: 24    End of Session   Activity Tolerance: Patient tolerated treatment well Patient left: in bed;with call bell/phone within reach Nurse Communication: Mobility status PT Visit Diagnosis: Unsteadiness on feet (R26.81)    Time: 1448-1856 PT Time Calculation (min) (ACUTE ONLY): 15 min   Charges:   PT Evaluation $PT Eval Low Complexity: 1 Low          [place] A. Dan Humphreys PT, DPT [facility] - Acute Rehabilitation Services   [place] A Rustin Erhart 12/16/2021, 11:09 AM

## 2021-12-16 NOTE — Discharge Summary (Signed)
Physician Discharge Summary  Jaryd Drew ZOX:[id number] DOB: 12/07/1970 DOA: 12/15/2021  PCP: Patient, No Pcp Per  Admit date: 12/15/2021 Discharge date: 12/16/2021  Admitted From: Home Disposition:  Home  Recommendations for Outpatient Follow-up:  Follow up with PCP in 1-2 weeks   Home Health:No Equipment/Devices:None   Discharge Condition:Stable  CODE STATUS:FULL  Diet recommendation: Reg  Brief/Interim Summary: 51 y.o. male with medical history significant for ADHD, anxiety, depression, hypertension, pulmonary thromboembolism with who presents to the ER for evaluation of dizziness. Patient is s/p kyphoplasty for T12 compression fracture which was done on 12/13/21.  He also notes that he had recently received the Shingrix vaccine a couple of days prior to his procedure.  Postprocedure he developed a fever at home with a Tmax of 101 and said he had flulike symptoms but was unable to elaborate.  He has had poor oral intake since his surgery but denies having any cough, no myalgias, no congestion.  He contacted his neurosurgeon and they called in a prescription for clindamycin which he was unable to pick up.  Patient notes complete resolution of his symptoms post kyphoplasty. He came into the ER on the day of admission with complaints of dizziness and was noted to be hypotensive in the ER with blood pressure 75/57.  He was not febrile when he arrived to ER.  Blood pressure improved with IV fluid resuscitation He denies having any chest pain, no shortness of breath, no abdominal pain, no headache, no changes in his bowel habits, no urinary symptoms, no leg swelling, no blurred vision or focal deficit. Labs show a total CK of 760, BUN of 23 and creatinine of 1.91 compared to baseline of 1.01, he also has markedly elevated liver enzymes.  Procalcitonin was elevated at 1.96 He received IV vancomycin and Zosyn in the ER He will be admitted to the hospital for further evaluation.  No  clear source of infection.  No fevers.  No white count. MRI spine with expected appearance of T12 vertebrae. Negative for abscess/infection.  Elevated procal and fever at home of unclear etiology.  Will treat empirically with clindamycin (previously prescribed as outpatient, patient did not start).  Stable for dc.  FU OP PCP and neurosurgery    Discharge Diagnoses:  Principal Problem:   AKI (acute kidney injury) (HCC) Active Problems:   Transaminitis   Major depressive disorder, single episode   Personal history of pulmonary embolism   Thrombocytopenia (HCC)   Asthma without status asthmaticus   Attention deficit disorder   Non-traumatic rhabdomyolysis   Fever   AKI (acute kidney injury) (HCC) Most likely secondary to poor oral intake with concomitant diuretic use Patient was hypotensive upon arrival to the ER with systolic blood pressure of 75 mmHg that responded to IV fluid hydration Serum creatinine on admission was 1.9 compared to baseline of 1.01 Improved at time of dc Can resume home med regimen Ensure adequate hydration     Transaminitis Unclear etiology but worse when compared to patient's baseline History of alcohol abuse in the past but states that he has been sober for about 6 weeks Worsening transaminitis may be related to rhabdomyolysis RUQ [place] with possible cirrhosis/chronic liver disease Outpatient PCP followup recommended   Fever Patient presents with complaints of fever following kyphoplasty which was done as an outpatient with a Tmax of 102 He remains afebrile here in the ER He was hypotensive when he arrived but responded to IV fluid resuscitation. Does not have any other sirs criteria, not tachycardic,  not tachypneic and does not have a white count but has elevated procalcitonin levels of 1.96 Received IV vancomycin and Zosyn in the ER Blood cultures ngtd at time of dc Will treat empirically with clindamycin600 TID x 5 total days   Non-traumatic  rhabdomyolysis Patient is status post recent kyphoplasty for T12 compression fracture following a fall which occurred on 11/04/21.  No other falls since then Noted to have elevated total CK levels MRI spine reassuring, no infectious focus identified  Discharge Instructions  Discharge Instructions     Diet - low sodium heart healthy   Complete by: As directed    Increase activity slowly   Complete by: As directed       Allergies as of 12/16/2021       Reactions   Metoprolol Anaphylaxis   AFFECTED MOTOR SKILLS Tolerated coreg 03/02/20   Lithium Other (See Comments)   Emotional issues admitted after taking   Valproic Acid Other (See Comments)   hallucinations   Depakote [divalproex Sodium] Anxiety   Nsaids Other (See Comments)   Gastritis Contraindicated due to Eliquis and Creon   Penicillins Rash   Tolerated Ancef February 2022   Sulfa Antibiotics Rash        Medication List     STOP taking these medications    cyanocobalamin 100 MCG tablet   thiamine 50 MG tablet   triamcinolone 55 MCG/ACT Aero nasal inhaler Commonly known as: NASACORT       TAKE these medications    albuterol 108 (90 Base) MCG/ACT inhaler Commonly known as: VENTOLIN HFA Inhale 1 puff into the lungs every 4 (four) hours as needed for wheezing or shortness of breath.   atorvastatin 20 MG tablet Commonly known as: LIPITOR Take 20 mg by mouth daily.   carvedilol 12.5 MG tablet Commonly known as: COREG Take 12.5 mg by mouth 2 (two) times daily.   clindamycin 300 MG capsule Commonly known as: CLEOCIN Take 2 capsules (600 mg total) by mouth every 8 (eight) hours for 4 days.   clonazePAM 1 MG tablet Commonly known as: KLONOPIN Take 1 tablet by mouth 3 (three) times daily as needed.   Creon 36000 UNITS Cpep capsule Generic drug: lipase/protease/amylase Take 1 capsule by mouth. 2 cap qam and 2 cap at noon , 2 cap qpm and 1 cap qhs = 7 caps a day   Eliquis 2.5 MG Tabs tablet Generic  drug: apixaban Take 2.5 mg by mouth 2 (two) times daily.   fenofibrate 145 MG tablet Commonly known as: TRICOR Take 145 mg by mouth daily.   Focalin XR 20 MG 24 hr capsule Generic drug: dexmethylphenidate Take 20 mg by mouth every evening.   Focalin XR 30 MG Cp24 Generic drug: Dexmethylphenidate HCl Take 1 capsule by mouth every morning.   furosemide 20 MG tablet Commonly known as: LASIX Take 20 mg by mouth daily.   omeprazole 20 MG capsule Commonly known as: PRILOSEC Take 20 mg by mouth daily.   ondansetron 8 MG disintegrating tablet Commonly known as: ZOFRAN-ODT Take 8 mg by mouth 2 (two) times daily.   oxyCODONE 5 MG immediate release tablet Commonly known as: Roxicodone Take 1 tablet (5 mg total) by mouth every 6 (six) hours as needed for up to 5 days for severe pain.   sertraline 100 MG tablet Commonly known as: ZOLOFT Take 200 mg by mouth daily.   SYMBICORT IN Inhale 1 puff into the lungs 2 (two) times daily.   Symbicort 80-4.5 MCG/ACT inhaler  Generic drug: budesonide-formoterol Inhale 2 puffs into the lungs at bedtime as needed.   tiZANidine 2 MG tablet Commonly known as: ZANAFLEX TAKE 1 TABLET(2 MG) BY MOUTH EVERY 8 HOURS AS NEEDED FOR MUSCLE SPASMS        Allergies  Allergen Reactions   Metoprolol Anaphylaxis    AFFECTED MOTOR SKILLS Tolerated coreg 03/02/20   Lithium Other (See Comments)    Emotional issues admitted after taking   Valproic Acid Other (See Comments)    hallucinations    Depakote [Divalproex Sodium] Anxiety   Nsaids Other (See Comments)    Gastritis Contraindicated due to Eliquis and Creon    Penicillins Rash    Tolerated Ancef February 2022    Sulfa Antibiotics Rash    Consultations: None   Procedures/Studies: US Abdomen Limited RUQ (LIVER/GB)  Result Date: 12/15/2021 CLINICAL DATA:  A 51 year old male presents with elevated liver enzymes post cholecystectomy. EXAM: ULTRASOUND ABDOMEN LIMITED RIGHT UPPER QUADRANT  COMPARISON:  November 04, 2021. FINDINGS: Gallbladder: Post cholecystectomy. Common bile duct: Diameter: 6 mm. No intrahepatic biliary duct dilation. Liver: Marked increased echogenicity of hepatic parenchyma with lobular hepatic contours. No visible lesion on submitted images in the hepatic parenchyma. Portal vein is patent on color Doppler imaging with normal direction of blood flow towards the liver. Other: None. IMPRESSION: Hepatic steatosis and lobular hepatic contours raising the question of liver disease/cirrhosis. Morphologic features on previous CT with caudate enlargement would support this possibility. Correlate with clinical parameters and laboratory values. Post cholecystectomy without acute biliary process, 6 mm common bile duct. Electronically Signed   By: Donzetta Kohut M.D.   On: 12/15/2021 14:37   MR THORACIC SPINE WO CONTRAST  Result Date: 12/15/2021 CLINICAL DATA:  51 year old male with a T12 compression fracture status post kyphoplasty two days ago. Now with fever. EXAM: MRI THORACIC SPINE WITHOUT CONTRAST TECHNIQUE: Multiplanar, multisequence MR imaging of the thoracic spine was performed. No intravenous contrast was administered. COMPARISON:  Fluoroscopic kyphoplasty images [id number]. MRI thoracic spine 11/12/2021. FINDINGS: Limited cervical spine imaging: Stable since last month, grossly negative. Thoracic spine segmentation: Appears to be normal, same numbering system used last month. Alignment: Stable thoracic kyphosis with mild levoconvex scoliosis. No spondylolisthesis. Vertebrae: Continued T12 vertebral body marrow edema related to mildly comminuted compression fracture. Evidence of interval augmentation, low signal central bone cement with no adverse features. However, mild deformity of the T12 inferior endplate is new from the MRI last month. Overall anterior wedging and loss of vertebral body height has not significantly changed. No retropulsion. T12 posterior elements appear within  normal limits. Adjacent T11 and L1 levels appears stable and negative. Mild heterogeneity of the T11-T12 disc appears unchanged and posttraumatic. No new marrow edema or acute osseous abnormality. Stable thoracic vertebral height and alignment elsewhere. Cord: Stable, within normal limits. No significant thoracic spinal stenosis. Conus medullaris appears to remain normal at T12-L1. Paraspinal and other soft tissues: Stable, negative. Disc levels: Stable and generally mild for age thoracic spine degeneration. IMPRESSION: 1. T12 compression fracture with unresolved marrow edema and interval augmentation. No unexpected findings or adverse features are identified. 2. No new abnormality in the thoracic spine. Electronically Signed   By: Odessa Fleming M.D.   On: 12/15/2021 08:27   DG Chest Port 1 View  Result Date: 12/15/2021 CLINICAL DATA:  51 year old male fever status post kyphoplasty 2 days ago. EXAM: PORTABLE CHEST 1 VIEW COMPARISON:  Fluoroscopic kyphoplasty images [id number]. Chest radiographs 11/04/2021 and earlier. FINDINGS: Portable AP upright view at 0402  hours. Mildly lower lung volumes compared to October. Stable cardiac size and mediastinal contours. Prior sternotomy. No significant cardiomegaly. Visualized tracheal air column is within normal limits. Mild left lung base atelectasis, mild elevation of the left hemidiaphragm now. But otherwise Allowing for portable technique the lungs are clear. No pneumothorax or pleural effusion. No acute osseous abnormality identified. Paucity of bowel gas in the visible abdomen. IMPRESSION: Mildly lower lung volumes and mild atelectasis at the left lung base. No other acute cardiopulmonary abnormality. Electronically Signed   By: Odessa Fleming M.D.   On: 12/15/2021 04:52   IR KYPHO THORACIC WITH BONE BIOPSY  Result Date: 12/13/2021 CLINICAL DATA:  51 year old male with a osteoporosis and current pathologic fracture, initial encounter for in closed fracture. He has a highly  symptomatic T12 fracture and presents for cement augmentation with balloon kyphoplasty. M80.08XA Age-related osteoporosis with current pathological fracture, vertebra(e), initial encounter for fracture S22.080A Wedge compression fracture of T11-T12 vertebra, initial encounter for closed fracture EXAM: FLUOROSCOPIC GUIDED KYPHOPLASTY OF THE T12 VERTEBRAL BODY COMPARISON:  None Available. MEDICATIONS: As antibiotic prophylaxis, 2 g Ancef was ordered pre-procedure and administered intravenously within 1 hour of incision. ANESTHESIA/SEDATION: Moderate (conscious) sedation was employed during this procedure. A total of Versed 5 mg and Fentanyl 100 mcg was administered intravenously. Moderate Sedation Time: 26 minutes. The patient's level of consciousness and vital signs were monitored continuously by radiology nursing throughout the procedure under my direct supervision. FLUOROSCOPY TIME:  Radiation exposure index: 66.2 mGy reference air kerma COMPLICATIONS: None immediate. PROCEDURE: The procedure, risks (including but not limited to bleeding, infection, organ damage), benefits, and alternatives were explained to the patient. Questions regarding the procedure were encouraged and answered. The patient understands and consents to the procedure. The patient was placed prone on the fluoroscopic table. The skin overlying the upper thoracic region was then prepped and draped in the usual sterile fashion. Maximal barrier sterile technique was utilized including caps, mask, sterile gowns, sterile gloves, sterile drape, hand hygiene and skin antiseptic. Intravenous Fentanyl and Versed were administered as conscious sedation during continuous cardiorespiratory monitoring by the radiology RN. The left pedicle at T12 was then infiltrated with 1% lidocaine followed by the advancement of a Kyphon trocar needle through the left pedicle into the posterior one-third of the vertebral body. Subsequently, the osteo drill was advanced to  the anterior third of the vertebral body. The osteo drill was retracted. Through the working cannula, a Kyphon inflatable bone tamp 15 x 2.5 was advanced and positioned with the distal marker approximately 5 mm from the anterior aspect of the cortex. Appropriate positioning was confirmed on the AP projection. At this time, the balloon was expanded using contrast via a Kyphon inflation syringe device via micro tubing. In similar fashion, the right T12 pedicle was infiltrated with 1% lidocaine followed by the advancement of a second Kyphon trocar needle through the right pedicle into the posterior third of the vertebral body. Subsequently, the osteo drill was coaxially advanced to the anterior right third. The osteo drill was exchanged for a Kyphon inflatable bone tamp 15 x 2.5, advanced to the 5 mm of the anterior aspect of the cortex. The balloon was then expanded using contrast as above. Inflations were continued until there was near apposition with the superior end plate. At this time, methylmethacrylate mixture was reconstituted in the Kyphon bone mixing device system. This was then loaded into the delivery mechanism, attached to Kyphon bone fillers. The balloons were deflated and removed followed by the  instillation of methylmethacrylate mixture with excellent filling in the AP and lateral projections. No extravasation was noted in the disk spaces or posteriorly into the spinal canal. No epidural venous contamination was seen. The working cannulae and the bone filler were then retrieved and removed. Hemostasis was achieved with manual compression. The patient tolerated the procedure well without immediate postprocedural complication. IMPRESSION: 1. Technically successful T12 vertebral body augmentation using balloon kyphoplasty. 2. Per CMS PQRS reporting requirements (PQRS Measure 24): Given the patient's age of greater than 50 and the fracture site (hip, distal radius, or spine), the patient should be tested  for osteoporosis using DXA, and the appropriate treatment considered based on the DXA results. Electronically Signed   By: Malachy MoanHeath  McCullough M.D.   On: 12/13/2021 11:28   IR Radiologist Eval & Mgmt  Result Date: 11/22/2021 EXAM: NEW PATIENT OFFICE VISIT CHIEF COMPLAINT: As documented EMR HISTORY OF PRESENT ILLNESS: Patient reports that he recently moved to new apartment, and slipped on a step and fell onto his lower back earlier this month. He was evaluated in the emergency department on October 13th, where MRI of his thoracic and lumbar spine demonstrated an acute T12 compression fracture with 30% height loss. He was discharged from the ED with a back brace and prescription pain medication. He continued to have no improvement in his back pain, and repeat thoracic spine MRI on October 21 demonstrated progressive height loss of his T12 compression fracture, estimated at 50%. He describes severe 10/10 pain with activity, that he describes as throbbing and sharp. At rest and with pain medication, there is mild improvement to his pain which is 7/10. He is not able to tolerate prescription narcotic pain medication (oxycodone) due to nausea and stomach problems with his other medications that he takes. He is able to tolerate muscle relaxer (tizanidine) somewhat better, but continues to have significant back pain. He describes moderate disability on the [employer] disability questionnaire 12/24 positive. Past medical history significant for thoracic aortic repair and valve replacement, for which he takes Eliquis. This was performed in February 2022. REVIEW OF SYSTEMS: As documented EMR PHYSICAL EXAMINATION: As documented EMR ASSESSMENT AND PLAN: As documented EMR Electronically Signed   By: Olive BassYasser  El-Abd M.D.   On: 11/22/2021 13:50      Subjective: Seen and examined on day of dc. Stable, no distress.  No/minimal pain.  No fevers   Discharge Exam: Vitals:   12/16/21 0420 12/16/21 1042  BP: (!) 149/96 (!)  153/102  Pulse: 70 67  Resp: 14 20  Temp: 98.1 F (36.7 C) 97.9 F (36.6 C)  SpO2: 98% 99%   Vitals:   12/15/21 2024 12/15/21 2050 12/16/21 0420 12/16/21 1042  BP: 128/87 (!) 142/96 (!) 149/96 (!) 153/102  Pulse: 70 66 70 67  Resp: 16 16 14 20   Temp: 98.1 F (36.7 C) (!) 97.5 F (36.4 C) 98.1 F (36.7 C) 97.9 F (36.6 C)  TempSrc: Oral Oral Oral Axillary  SpO2: 100% 98% 98% 99%  Weight:      Height:        General: Pt is alert, awake, not in acute distress Cardiovascular: RRR, S1/S2 +, no rubs, no gallops Respiratory: CTA bilaterally, no wheezing, no rhonchi Abdominal: Soft, NT, ND, bowel sounds + Extremities: no edema, no cyanosis    The results of significant diagnostics from this hospitalization (including imaging, microbiology, ancillary and laboratory) are listed below for reference.     Microbiology: Recent Results (from the past 240 hour(s))  Culture, blood (routine x 2)     Status: None (Preliminary result)   Collection Time: 12/15/21  2:23 AM   Specimen: BLOOD RIGHT ARM  Result Value Ref Range Status   Specimen Description BLOOD RIGHT ARM  Final   Special Requests   Final    BOTTLES DRAWN AEROBIC AND ANAEROBIC Blood Culture results may not be optimal due to an excessive volume of blood received in culture bottles   Culture   Final    NO GROWTH 1 DAY Performed at [facility], [address] [place] [address], [place], [place] [address]    Report Status PENDING  Incomplete  Resp Panel by RT-PCR (Flu A&B, Covid) Anterior Nasal Swab     Status: None   Collection Time: 12/15/21  2:23 AM   Specimen: Anterior Nasal Swab  Result Value Ref Range Status   SARS Coronavirus 2 by RT PCR NEGATIVE NEGATIVE Final    Comment: (NOTE) SARS-CoV-2 target nucleic acids are NOT DETECTED.  The SARS-CoV-2 RNA is generally detectable in upper respiratory specimens during the acute phase of infection. The lowest concentration of SARS-CoV-2 viral copies this assay can detect is 138  copies/mL. A negative result does not preclude SARS-Cov-2 infection and should not be used as the sole basis for treatment or other patient management decisions. A negative result may occur with  improper specimen collection/handling, submission of specimen other than nasopharyngeal swab, presence of viral mutation(s) within the areas targeted by this assay, and inadequate number of viral copies(<138 copies/mL). A negative result must be combined with clinical observations, patient history, and epidemiological information. The expected result is Negative.  Fact Sheet for Patients:  BloggerCourse.com  Fact Sheet for Healthcare Providers:  SeriousBroker.it  This test is no t yet approved or cleared by the [place] FDA and  has been authorized for detection and/or diagnosis of SARS-CoV-2 by FDA under an Emergency Use Authorization (EUA). This EUA will remain  in effect (meaning this test can be used) for the duration of the COVID-19 declaration under Section 564(b)(1) of the Act, 21 U.S.C.section 360bbb-3(b)(1), unless the authorization is terminated  or revoked sooner.       Influenza A by PCR NEGATIVE NEGATIVE Final   Influenza B by PCR NEGATIVE NEGATIVE Final    Comment: (NOTE) The Xpert Xpress SARS-CoV-2/FLU/RSV plus assay is intended as an aid in the diagnosis of influenza from Nasopharyngeal swab specimens and should not be used as a sole basis for treatment. Nasal washings and aspirates are unacceptable for Xpert Xpress SARS-CoV-2/FLU/RSV testing.  Fact Sheet for Patients: BloggerCourse.com  Fact Sheet for Healthcare Providers: SeriousBroker.it  This test is not yet approved or cleared by the [place] FDA and has been authorized for detection and/or diagnosis of SARS-CoV-2 by FDA under an Emergency Use Authorization (EUA). This EUA will remain in effect (meaning  this test can be used) for the duration of the COVID-19 declaration under Section 564(b)(1) of the Act, 21 U.S.C. section 360bbb-3(b)(1), unless the authorization is terminated or revoked.  Performed at [facility], [address], [place], [place] [address]   Urine Culture     Status: None   Collection Time: 12/15/21  4:01 AM   Specimen: Urine, Clean Catch  Result Value Ref Range Status   Specimen Description   Final    URINE, CLEAN CATCH Performed at [facility], [address], [place], [place] [address]    Special Requests   Final    NONE Performed at [facility], [address], [place], [place] [address]    Culture   Final    NO GROWTH Performed at [facility], [address] [place], [place] [address]    Report Status 12/16/2021 FINAL  Final  Culture, blood (routine x 2)     Status: None (Preliminary result)   Collection Time: 12/15/21  6:43 AM   Specimen: BLOOD RIGHT HAND  Result Value Ref Range Status   Specimen Description BLOOD RIGHT HAND  Final   Special Requests   Final    BOTTLES DRAWN AEROBIC AND ANAEROBIC Blood Culture results may not be optimal due to an excessive volume of blood received in culture bottles   Culture   Final    NO GROWTH 1 DAY Performed at [facility], [address], [place], [place] [address]    Report Status PENDING  Incomplete     Labs: BNP (last 3 results) No results for input(s): "BNP" in the last 8760 hours. Basic Metabolic Panel: Recent Labs  Lab 12/15/21 0223 12/16/21 0557  NA 135 140  K 3.5 3.3*  CL 99 106  CO2 25 26  GLUCOSE 105* 96  BUN 23* 13  CREATININE 1.91* 0.77  CALCIUM 8.7* 8.2*   Liver Function Tests: Recent Labs  Lab 12/15/21 0223 12/16/21 0557  AST 746* 417*  ALT 406* 310*  ALKPHOS 51 49  BILITOT 1.4* 0.6  PROT 8.3* 6.9  ALBUMIN 4.4 3.5   Recent Labs  Lab 12/15/21 0642  LIPASE 32   No results for input(s): "AMMONIA" in the last 168  hours. CBC: Recent Labs  Lab 12/15/21 0223 12/16/21 0557  WBC 4.8 3.5*  NEUTROABS 3.0  --   HGB 13.6 13.0  HCT 39.1 37.0*  MCV 89.3 88.7  PLT 114* 107*   Cardiac Enzymes: Recent Labs  Lab 12/15/21 0642 12/16/21 0557  CKTOTAL 760* 353   BNP: Invalid input(s): "POCBNP" CBG: No results for input(s): "GLUCAP" in the last 168 hours. D-Dimer No results for input(s): "DDIMER" in the last 72 hours. Hgb A1c No results for input(s): "HGBA1C" in the last 72 hours. Lipid Profile No results for input(s): "CHOL", "HDL", "LDLCALC", "TRIG", "CHOLHDL", "LDLDIRECT" in the last 72 hours. Thyroid function studies No results for input(s): "TSH", "T4TOTAL", "T3FREE", "THYROIDAB" in the last 72 hours.  Invalid input(s): "FREET3" Anemia work up No results for input(s): "VITAMINB12", "FOLATE", "FERRITIN", "TIBC", "IRON", "RETICCTPCT" in the last 72 hours. Urinalysis    Component Value Date/Time   COLORURINE AMBER (A) 12/15/2021 0401   APPEARANCEUR CLOUDY (A) 12/15/2021 0401   LABSPEC 1.020 12/15/2021 0401   PHURINE 5.0 12/15/2021 0401   GLUCOSEU NEGATIVE 12/15/2021 0401   HGBUR SMALL (A) 12/15/2021 0401   BILIRUBINUR NEGATIVE 12/15/2021 0401   KETONESUR NEGATIVE 12/15/2021 0401   PROTEINUR 100 (A) 12/15/2021 0401   NITRITE NEGATIVE 12/15/2021 0401   LEUKOCYTESUR NEGATIVE 12/15/2021 0401   Sepsis Labs Recent Labs  Lab 12/15/21 0223 12/16/21 0557  WBC 4.8 3.5*   Microbiology Recent Results (from the past 240 hour(s))  Culture, blood (routine x 2)     Status: None (Preliminary result)   Collection Time: 12/15/21  2:23 AM   Specimen: BLOOD RIGHT ARM  Result Value Ref Range Status   Specimen Description BLOOD RIGHT ARM  Final   Special Requests   Final    BOTTLES DRAWN AEROBIC AND ANAEROBIC Blood Culture results may not be optimal due to an excessive volume of blood received in culture bottles  Culture   Final    NO GROWTH 1 DAY Performed at [facility], [address] [place], [place] [address]    Report Status PENDING  Incomplete  Resp Panel by RT-PCR (Flu A&B, Covid) Anterior Nasal Swab     Status: None   Collection Time: 12/15/21  2:23 AM   Specimen: Anterior Nasal Swab  Result Value Ref Range Status   SARS Coronavirus 2 by RT PCR NEGATIVE NEGATIVE Final    Comment: (NOTE) SARS-CoV-2 target nucleic acids are NOT DETECTED.  The SARS-CoV-2 RNA is generally detectable in upper respiratory specimens during the acute phase of infection. The lowest concentration of SARS-CoV-2 viral copies this assay can detect is 138 copies/mL. A negative result does not preclude SARS-Cov-2 infection and should not be used as the sole basis for treatment or other patient management decisions. A negative result may occur with  improper specimen collection/handling, submission of specimen other than nasopharyngeal swab, presence of viral mutation(s) within the areas targeted by this assay, and inadequate number of viral copies(<138 copies/mL). A negative result must be combined with clinical observations, patient history, and epidemiological information. The expected result is Negative.  Fact Sheet for Patients:  BloggerCourse.com  Fact Sheet for Healthcare Providers:  SeriousBroker.it  This test is no t yet approved or cleared by the [place] FDA and  has been authorized for detection and/or diagnosis of SARS-CoV-2 by FDA under an Emergency Use Authorization (EUA). This EUA will remain  in effect (meaning this test can be used) for the duration of the COVID-19 declaration under Section 564(b)(1) of the Act, 21 U.S.C.section 360bbb-3(b)(1), unless the authorization is terminated  or revoked sooner.       Influenza A by PCR NEGATIVE NEGATIVE Final   Influenza B by PCR NEGATIVE NEGATIVE Final    Comment: (NOTE) The Xpert Xpress SARS-CoV-2/FLU/RSV plus assay is intended as an aid in the diagnosis of  influenza from Nasopharyngeal swab specimens and should not be used as a sole basis for treatment. Nasal washings and aspirates are unacceptable for Xpert Xpress SARS-CoV-2/FLU/RSV testing.  Fact Sheet for Patients: BloggerCourse.com  Fact Sheet for Healthcare Providers: SeriousBroker.it  This test is not yet approved or cleared by the [place] FDA and has been authorized for detection and/or diagnosis of SARS-CoV-2 by FDA under an Emergency Use Authorization (EUA). This EUA will remain in effect (meaning this test can be used) for the duration of the COVID-19 declaration under Section 564(b)(1) of the Act, 21 U.S.C. section 360bbb-3(b)(1), unless the authorization is terminated or revoked.  Performed at [facility], [address], [place], [place] [address]   Urine Culture     Status: None   Collection Time: 12/15/21  4:01 AM   Specimen: Urine, Clean Catch  Result Value Ref Range Status   Specimen Description   Final    URINE, CLEAN CATCH Performed at [facility], [address], [place], [place] [address]    Special Requests   Final    NONE Performed at [facility], [address], [place], [place] [address]    Culture   Final    NO GROWTH Performed at [facility], [address] [place], [place] [address]    Report Status 12/16/2021 FINAL  Final  Culture, blood (routine x 2)     Status: None (Preliminary result)   Collection Time: 12/15/21  6:43 AM   Specimen: BLOOD RIGHT HAND  Result Value Ref Range Status  Specimen Description BLOOD RIGHT HAND  Final   Special Requests   Final    BOTTLES DRAWN AEROBIC AND ANAEROBIC Blood Culture results may not be optimal due to an excessive volume of blood received in culture bottles   Culture   Final    NO GROWTH 1 DAY Performed at [facility], [address], [place], [place] [address]    Report Status PENDING   Incomplete     Time coordinating discharge: Over 30 minutes  SIGNED:   Tresa Moore, MD  [facility] 12/16/2021, 12:42 PM Pager   If 7PM-7AM, please contact night-coverage

## 2021-12-16 NOTE — Progress Notes (Signed)
OT Screen Note  Patient Details Name: Brian [name] MRN: [id number] DOB: [birth year]   Cancelled Treatment:    Reason Eval/Treat Not Completed: OT screened, no needs identified, will sign off. Per PT, pt independent with ADL, at baseline. No OT needs at this time. Will sign off.   Arman Filter., MPH, MS, OTR/L ascom [phone] 12/16/21, 11:13 AM

## 2021-12-19 ENCOUNTER — Telehealth: Payer: Self-pay

## 2021-12-19 NOTE — Telephone Encounter (Signed)
-----   Message from [name] sent at 12/19/2021  7:56 AM EST ----- Regarding: pain med refill Contact: [phone] Patient left v/m on 11/27 2:36am He is requesting a refill of oxy 5mg  for Tuesday to Sunday [address] He was in the ER for an pathogen infection but is doing better you can see his notes from the ER.

## 2021-12-19 NOTE — Telephone Encounter (Addendum)
Chart reviewed. Had temp after kyphoplasty and he called radiologist at GI- was called in clindamycin.   Was seen in ED and admitted before starting clindamycin. No temp in hospital.  WBC 3.5 Blood cultures showed no growth at 4 days Procalcitonin 1.96  MRI of thoracic spine dated 12/15/21:  FINDINGS: Limited cervical spine imaging: Stable since last month, grossly negative.   Thoracic spine segmentation: Appears to be normal, same numbering system used last month.   Alignment: Stable thoracic kyphosis with mild levoconvex scoliosis. No spondylolisthesis.   Vertebrae: Continued T12 vertebral body marrow edema related to mildly comminuted compression fracture. Evidence of interval augmentation, low signal central bone cement with no adverse features. However, mild deformity of the T12 inferior endplate is new from the MRI last month. Overall anterior wedging and loss of vertebral body height has not significantly changed. No retropulsion. T12 posterior elements appear within normal limits.   Adjacent T11 and L1 levels appears stable and negative. Mild heterogeneity of the T11-T12 disc appears unchanged and posttraumatic.   No new marrow edema or acute osseous abnormality. Stable thoracic vertebral height and alignment elsewhere.   Cord: Stable, within normal limits. No significant thoracic spinal stenosis. Conus medullaris appears to remain normal at T12-L1.   Paraspinal and other soft tissues: Stable, negative.   Disc levels:   Stable and generally mild for age thoracic spine degeneration.   IMPRESSION: 1. T12 compression fracture with unresolved marrow edema and interval augmentation. No unexpected findings or adverse features are identified.   2. No new abnormality in the thoracic spine.     Electronically Signed   By: Odessa Fleming M.D.   On: 12/15/2021 08:27  Above imaging reviewed and I agree with findings.     Spoke with patient today. He is doing well with  improvement in his back pain. He has mild intermittent soreness in the back. Sharp/piercing pain he had prior to the kyphoplasty is gone.   No fevers or chills since he left hospital. He feels good.   PMP reviewed and he filled oxycodone on 11/24. It would be due on Wednesday 11/29. Will fill on Wednesday and change directions to 1 q 8 hours prn pain.   He will continue on clindamycin that he was given on discharge from hospital.   He will call with any concerns.

## 2021-12-20 LAB — CULTURE, BLOOD (ROUTINE X 2)
Culture: NO GROWTH
Culture: NO GROWTH

## 2021-12-20 NOTE — Telephone Encounter (Signed)
Can you review this with him?

## 2021-12-21 ENCOUNTER — Telehealth: Payer: Self-pay | Admitting: Orthopedic Surgery

## 2021-12-21 DIAGNOSIS — S22080A Wedge compression fracture of T11-T12 vertebra, initial encounter for closed fracture: Secondary | ICD-10-CM

## 2021-12-21 MED ORDER — OXYCODONE HCL 5 MG PO TABS: 5.0000 mg | ORAL_TABLET | Freq: Three times a day (TID) | ORAL | 0 refills | Status: DC | PRN

## 2021-12-21 NOTE — Telephone Encounter (Signed)
Refill of oxycodone sent to pharmacy. PMP reviewed and is appropriate.   Directions changed to q 8 hours prn pain. Will change to q 12 hours at next script.   Script sent to [facility] on [address].

## 2021-12-21 NOTE — Telephone Encounter (Signed)
-----   Message from Drake Leach, [place] sent at 12/19/2021 10:32 AM EST ----- Refill his oxycodone. Change to q 8 hours prn.

## 2021-12-23 ENCOUNTER — Telehealth: Payer: Self-pay

## 2021-12-23 NOTE — Telephone Encounter (Signed)
-----   Message from [name] sent at 12/23/2021  1:12 PM EST ----- Regarding: pain med refill Contact: [phone] Oxycodone S Brian [name] Wants to pick up on Monday

## 2021-12-23 NOTE — Telephone Encounter (Signed)
PMP reviewed. He would be out on Monday. Will change directions to 1 q 12 hours prn pain.   Will send myself a reminder to fill Monday.

## 2021-12-26 ENCOUNTER — Telehealth: Payer: Self-pay | Admitting: Orthopedic Surgery

## 2021-12-26 DIAGNOSIS — S22080A Wedge compression fracture of T11-T12 vertebra, initial encounter for closed fracture: Secondary | ICD-10-CM

## 2021-12-26 MED ORDER — OXYCODONE HCL 5 MG PO TABS: 5.0000 mg | ORAL_TABLET | Freq: Two times a day (BID) | ORAL | 0 refills | Status: DC | PRN

## 2021-12-26 NOTE — Telephone Encounter (Signed)
Refill for oxycodone is due. Directions changed to q 12 hours prn severe pain.   PMP reviewed and is appropriate. Refill sent to [facility].

## 2021-12-26 NOTE — Telephone Encounter (Signed)
-----   Message from Drake Leach, [place] sent at 12/23/2021  3:59 PM EST ----- Refill oxycodone today. 1 q 12 hours prn.   Send to [facility] on [address]

## 2021-12-29 ENCOUNTER — Telehealth: Payer: Self-pay | Admitting: Orthopedic Surgery

## 2021-12-29 NOTE — Telephone Encounter (Signed)
PMP reviewed. He would be due for refill of oxycodone on Saturday 12/9.   Will change directions to 1 per day prn pain. This will be his last refill.   Will send to pharmacy tomorrow for them to fill on Saturday 12/31/21.

## 2021-12-29 NOTE — Telephone Encounter (Signed)
-----   Message from [name] sent at 12/29/2021 11:45 AM EST ----- Regarding: med refill Contact: [phone] Patient is requesting his last refill for  Oxycodone [facility] [address] He will pick it up Saturday

## 2021-12-30 ENCOUNTER — Telehealth: Payer: Self-pay | Admitting: Orthopedic Surgery

## 2021-12-30 DIAGNOSIS — S22080A Wedge compression fracture of T11-T12 vertebra, initial encounter for closed fracture: Secondary | ICD-10-CM

## 2021-12-30 MED ORDER — OXYCODONE HCL 5 MG PO TABS: 5.0000 mg | ORAL_TABLET | Freq: Every day | ORAL | 0 refills | Status: DC | PRN

## 2021-12-30 NOTE — Telephone Encounter (Signed)
-----   Message from Drake Leach, [place] sent at 12/29/2021 12:47 PM EST ----- He would be due for refill of oxycodone on Saturday 12/9.   Will change directions to 1 per day prn pain. This will be his last refill.   Will send to pharmacy tomorrow for them to fill on Saturday 12/31/21.

## 2021-12-30 NOTE — Telephone Encounter (Signed)
PMP reviewed. He is due for refill of oxycodone tomorrow.   Directions changed to 1 a day. This is his last refill.

## 2022-01-04 NOTE — Progress Notes (Deleted)
New Outpatient Visit Date: 01/05/2022  Referring Provider: Concha Se, MD [address] [place],  [place] [address]  Chief Complaint: ***  HPI:  Brian [name] is a 51 y.o. male who is being seen today for the evaluation of syncope at the request of Dr. Fuller Plan. He has a history of thoracic aortic aneurysm status post repair and bioprosthetic AVR, pulmonary embolism, hypertension, hyperlipidemia, alcohol abuse, and chronic pancreatitis.  He was initially managed at [facility] in 2021, where he underwent root replacement and bioprosthetic AVR.  He was then seen at [facility] cardiology in [place] in June, at which time he complained of leg numbness leading to fall.  He was referred for CTA of the chest for follow-up of his TAA repair.  He was initially referred to [place] after falling in mid October.  Initially, it was unclear if he may have passed out or slipped, though on further questioning the patient believed that he slipped and lost his balance.  He suffered a lower thoracic spine compression fracture that was subsequently treated with kyphoplasty.  He was admitted last month at [facility] with dizziness.  He had undergone kyphoplasty by interventional radiology 2 days earlier he had also been febrile after receiving shingles shot on 12/10/2021.  He was admitted due to AKI, abnormal LFTs, and hypotension (initial BP 75/57).  Blood pressure improved with fluid resuscitation.  He was treated with empiric vancomycin and Zosyn due to elevated procalcitonin.  Following overnight observation, he was discharged on clindamycin (previously prescribed but not yet filled by the patient) with plans for outpatient PCP and neurology follow-up.  --------------------------------------------------------------------------------------------------  Cardiovascular History & Procedures: Cardiovascular Problems: ***  Risk Factors: ***  Cath/PCI: ***  CV Surgery: ***  EP Procedures and Devices: ***  Non-Invasive  Evaluation(s): ***  Recent CV Pertinent Labs: Lab Results  Component Value Date   K 3.3 (L) 12/16/2021   MG 1.2 (L) 03/15/2016   BUN 13 12/16/2021   CREATININE 0.77 12/16/2021    --------------------------------------------------------------------------------------------------  Past Medical History:  Diagnosis Date   ADHD    Alcohol abuse    Anxiety    Asthma    Congenital heart defect    Depression    Hyperlipidemia    Hypertension    Pulmonary thromboembolism (HCC)    Seizure (HCC)    Thoracic aortic aneurysm (HCC)     Past Surgical History:  Procedure Laterality Date   artificial aorta and pig valve  02/25/2020   CARDIAC CATHETERIZATION     CHOLECYSTECTOMY  2004   COLONOSCOPY     DENTAL SURGERY     IR KYPHO THORACIC WITH BONE BIOPSY  12/13/2021   IR RADIOLOGIST EVAL & MGMT  11/22/2021   LIVER BIOPSY  2004    No outpatient medications have been marked as taking for the 01/05/22 encounter (Appointment) with Shaneece Stockburger, Cristal Deer, MD.    Allergies: Metoprolol, Lithium, Valproic acid, Depakote [divalproex sodium], Nsaids, Penicillins, and Sulfa antibiotics  Social History   Tobacco Use   Smoking status: Never   Smokeless tobacco: Never  Substance Use Topics   Alcohol use: Yes    Comment: drinks a 5th a day   Drug use: No    No family history on file.  Review of Systems: A 12-system review of systems was performed and was negative except as noted in the HPI.  --------------------------------------------------------------------------------------------------  Physical Exam: There were no vitals taken for this visit.  General:  *** HEENT: No conjunctival pallor or scleral icterus. Neck: Supple  without lymphadenopathy, thyromegaly, JVD, or HJR. No carotid bruit. Lungs: Normal work of breathing. Clear to auscultation bilaterally without wheezes or crackles. Heart: Regular rate and rhythm without murmurs, rubs, or gallops. Non-displaced PMI. Abd: Bowel  sounds present. Soft, NT/ND without hepatosplenomegaly Ext: No lower extremity edema. Radial, PT, and DP pulses are 2+ bilaterally Skin: Warm and dry without rash. Neuro: CNIII-XII intact. Strength and fine-touch sensation intact in upper and lower extremities bilaterally. Psych: Normal mood and affect.  EKG:  ***  Lab Results  Component Value Date   WBC 3.5 (L) 12/16/2021   HGB 13.0 12/16/2021   HCT 37.0 (L) 12/16/2021   MCV 88.7 12/16/2021   PLT 107 (L) 12/16/2021    Lab Results  Component Value Date   NA 140 12/16/2021   K 3.3 (L) 12/16/2021   CL 106 12/16/2021   CO2 26 12/16/2021   BUN 13 12/16/2021   CREATININE 0.77 12/16/2021   GLUCOSE 96 12/16/2021   ALT 310 (H) 12/16/2021    No results found for: "CHOL", "HDL", "LDLCALC", "LDLDIRECT", "TRIG", "CHOLHDL"   --------------------------------------------------------------------------------------------------  ASSESSMENT AND PLAN: Brian Kendall, MD 01/04/2022 8:48 AM

## 2022-01-05 ENCOUNTER — Ambulatory Visit: Payer: Medicaid Other | Attending: Internal Medicine | Admitting: Internal Medicine

## 2022-01-06 ENCOUNTER — Encounter: Payer: Self-pay | Admitting: Internal Medicine

## 2022-02-17 ENCOUNTER — Other Ambulatory Visit: Payer: Self-pay | Admitting: Orthopedic Surgery

## 2022-02-17 DIAGNOSIS — S22080A Wedge compression fracture of T11-T12 vertebra, initial encounter for closed fracture: Secondary | ICD-10-CM

## 2022-03-02 ENCOUNTER — Encounter: Payer: Self-pay | Admitting: Urology

## 2022-03-02 ENCOUNTER — Ambulatory Visit: Payer: Medicaid Other | Admitting: Urology

## 2022-03-02 VITALS — BP 142/98 | HR 87 | Ht 73.0 in | Wt 220.0 lb

## 2022-03-02 DIAGNOSIS — R300 Dysuria: Secondary | ICD-10-CM | POA: Diagnosis not present

## 2022-03-02 DIAGNOSIS — R82998 Other abnormal findings in urine: Secondary | ICD-10-CM

## 2022-03-02 DIAGNOSIS — R39198 Other difficulties with micturition: Secondary | ICD-10-CM

## 2022-03-02 DIAGNOSIS — R350 Frequency of micturition: Secondary | ICD-10-CM

## 2022-03-02 DIAGNOSIS — R369 Urethral discharge, unspecified: Secondary | ICD-10-CM

## 2022-03-02 LAB — BLADDER SCAN AMB NON-IMAGING: Scan Result: 0

## 2022-03-02 MED ORDER — DOXYCYCLINE HYCLATE 100 MG PO CAPS: 100.0000 mg | ORAL_CAPSULE | Freq: Two times a day (BID) | ORAL | 0 refills | Status: DC

## 2022-03-02 NOTE — Progress Notes (Signed)
   03/02/22 2:51 PM   Brian [name] 01-13-1971 [id number]  CC: Urinary frequency, penile discharge, dysuria  HPI: 52 year old male who reports 1 week of urinary frequency, dysuria, penile discharge.  He denies any sexual activity over the last 4 years.  It seems to have gotten a little bit better, he denies any gross hematuria.  He denies any urinary symptoms prior to this episode over the last week.  His history is actually notable for having a key lock stuck on his penis that he was using for masturbation in 2018, and Dr. Pilar Jarvis was consulted at that time and ultimately he needed to use a Dremel tool to cut and remove the lock.  Urinalysis today with 6-10 WBC, 0-2 RBC, moderate bacteria, trace leuk esterase, nitrite negative.  Will send for culture  PVR is normal today at 22ml.  PMH: Past Medical History:  Diagnosis Date   ADHD    Alcohol abuse    Anxiety    Asthma    Congenital heart defect    Depression    Hyperlipidemia    Hypertension    Pulmonary thromboembolism ([place])    Seizure ([place])    Thoracic aortic aneurysm [facility])     Surgical History: Past Surgical History:  Procedure Laterality Date   artificial aorta and pig valve  02/25/2020   CARDIAC CATHETERIZATION     CHOLECYSTECTOMY  2004   COLONOSCOPY     DENTAL SURGERY     IR KYPHO THORACIC WITH BONE BIOPSY  12/13/2021   IR RADIOLOGIST EVAL & MGMT  11/22/2021   LIVER BIOPSY  2004    Family History: No family history on file.  Social History:  reports that he has never smoked. He has never used smokeless tobacco. He reports current alcohol use. He reports that he does not use drugs.  Physical Exam: BP (!) 142/98   Pulse 87   Ht 6\' 1"  (1.854 m)   Wt 220 lb (99.8 kg)   BMI 29.03 kg/m    Constitutional:  Alert and oriented, No acute distress. Cardiovascular: No clubbing, cyanosis, or edema. Respiratory: Normal respiratory effort, no increased work of breathing. GI: Abdomen is soft, nontender,  nondistended, no abdominal masses GU: Circumcised phallus with patent meatus, no lesions, testicles 20 cc in center bilaterally  Assessment & Plan:   52 year old male with 1 week of urinary frequency, dysuria, penile discharge, UA suspicious for possible UTI and will send for culture.  Notably, he also has a history of a key lock stuck on the penis that ultimately required urology consult in 2018 to be removed with a Dremel tool.  Other possible etiologies could include urethral stricture from that prior episode.  Urine sent for culture/atypicals Doxycycline 100 mg twice daily x 14 days Follow-up if no improvement, consider cystoscopy to evaluate for urethral stricture  Nickolas Madrid, MD 03/02/2022  [facility] [address] [place], [place] [address] [phone]

## 2022-03-03 ENCOUNTER — Emergency Department (HOSPITAL_COMMUNITY)
Admission: EM | Admit: 2022-03-03 | Discharge: 2022-03-03 | Disposition: A | Discharge status: Home / Self Care | Payer: Medicaid Other | Attending: Emergency Medicine | Admitting: Emergency Medicine

## 2022-03-03 ENCOUNTER — Emergency Department (HOSPITAL_COMMUNITY): Payer: Medicaid Other

## 2022-03-03 DIAGNOSIS — R42 Dizziness and giddiness: Secondary | ICD-10-CM

## 2022-03-03 DIAGNOSIS — W1830XA Fall on same level, unspecified, initial encounter: Secondary | ICD-10-CM | POA: Diagnosis not present

## 2022-03-03 DIAGNOSIS — S0181XA Laceration without foreign body of other part of head, initial encounter: Secondary | ICD-10-CM

## 2022-03-03 DIAGNOSIS — S0101XA Laceration without foreign body of scalp, initial encounter: Secondary | ICD-10-CM | POA: Insufficient documentation

## 2022-03-03 DIAGNOSIS — Z7901 Long term (current) use of anticoagulants: Secondary | ICD-10-CM | POA: Diagnosis not present

## 2022-03-03 DIAGNOSIS — J449 Chronic obstructive pulmonary disease, unspecified: Secondary | ICD-10-CM | POA: Insufficient documentation

## 2022-03-03 DIAGNOSIS — W19XXXA Unspecified fall, initial encounter: Secondary | ICD-10-CM

## 2022-03-03 DIAGNOSIS — S0990XA Unspecified injury of head, initial encounter: Secondary | ICD-10-CM | POA: Diagnosis present

## 2022-03-03 LAB — COMPREHENSIVE METABOLIC PANEL
ALT: 33 U/L (ref 0–44)
AST: 52 U/L — ABNORMAL HIGH (ref 15–41)
Albumin: 3.6 g/dL (ref 3.5–5.0)
Alkaline Phosphatase: 57 U/L (ref 38–126)
Anion gap: 17 — ABNORMAL HIGH (ref 5–15)
BUN: 9 mg/dL (ref 6–20)
CO2: 21 mmol/L — ABNORMAL LOW (ref 22–32)
Calcium: 8 mg/dL — ABNORMAL LOW (ref 8.9–10.3)
Chloride: 96 mmol/L — ABNORMAL LOW (ref 98–111)
Creatinine, Ser: 0.86 mg/dL (ref 0.61–1.24)
GFR, Estimated: >60 mL/min (ref >60)
Glucose, Bld: 69 mg/dL — ABNORMAL LOW (ref 70–99)
Potassium: 3.3 mmol/L — ABNORMAL LOW (ref 3.5–5.1)
Sodium: 134 mmol/L — ABNORMAL LOW (ref 135–145)
Total Bilirubin: 1.4 mg/dL — ABNORMAL HIGH (ref 0.3–1.2)
Total Protein: 6.9 g/dL (ref 6.5–8.1)

## 2022-03-03 LAB — CBC WITH DIFFERENTIAL/PLATELET
Abs Immature Granulocytes: 0.02 10*3/uL (ref 0.00–0.07)
Basophils Absolute: 0.1 10*3/uL (ref 0.0–0.1)
Basophils Relative: 1 %
Eosinophils Absolute: 0.1 10*3/uL (ref 0.0–0.5)
Eosinophils Relative: 2 %
HCT: 39.6 % (ref 39.0–52.0)
Hemoglobin: 13.7 g/dL (ref 13.0–17.0)
Immature Granulocytes: 0 %
Lymphocytes Relative: 32 %
Lymphs Abs: 2.1 10*3/uL (ref 0.7–4.0)
MCH: 29.6 pg (ref 26.0–34.0)
MCHC: 34.6 g/dL (ref 30.0–36.0)
MCV: 85.5 fL (ref 80.0–100.0)
Monocytes Absolute: 0.3 10*3/uL (ref 0.1–1.0)
Monocytes Relative: 5 %
Neutro Abs: 3.9 10*3/uL (ref 1.7–7.7)
Neutrophils Relative %: 60 %
Platelets: 188 10*3/uL (ref 150–400)
RBC: 4.63 MIL/uL (ref 4.22–5.81)
RDW: 13.6 % (ref 11.5–15.5)
WBC: 6.6 10*3/uL (ref 4.0–10.5)
nRBC: 0 % (ref 0.0–0.2)

## 2022-03-03 LAB — URINALYSIS, COMPLETE
Bilirubin, UA: NEGATIVE
Glucose, UA: NEGATIVE
Nitrite, UA: NEGATIVE
RBC, UA: NEGATIVE
Specific Gravity, UA: 1.015 (ref 1.005–1.030)
Urobilinogen, Ur: 1 mg/dL (ref 0.2–1.0)
pH, UA: 5.5 (ref 5.0–7.5)

## 2022-03-03 LAB — ETHANOL: Alcohol, Ethyl (B): 88 mg/dL — ABNORMAL HIGH (ref <10)

## 2022-03-03 LAB — MICROSCOPIC EXAMINATION

## 2022-03-03 LAB — PHOSPHORUS: Phosphorus: 3 mg/dL (ref 2.5–4.6)

## 2022-03-03 LAB — MAGNESIUM: Magnesium: 1.3 mg/dL — ABNORMAL LOW (ref 1.7–2.4)

## 2022-03-03 MED ORDER — POTASSIUM CHLORIDE CRYS ER 20 MEQ PO TBCR
40.0000 meq | EXTENDED_RELEASE_TABLET | Freq: Once | ORAL | Status: AC
  Administered 2022-03-03: 40 meq via ORAL
  Filled 2022-03-03: qty 2

## 2022-03-03 MED ORDER — LIDOCAINE HCL (PF) 1 % IJ SOLN
30.0000 mL | Freq: Once | INTRAMUSCULAR | Status: AC
  Administered 2022-03-03: 30 mL
  Filled 2022-03-03: qty 30

## 2022-03-03 MED ORDER — OXYCODONE-ACETAMINOPHEN 5-325 MG PO TABS
1.0000 | ORAL_TABLET | Freq: Once | ORAL | Status: AC
  Administered 2022-03-03: 1 via ORAL
  Filled 2022-03-03: qty 1

## 2022-03-03 MED ORDER — LIDOCAINE-EPINEPHRINE (PF) 2 %-1:200000 IJ SOLN
20.0000 mL | Freq: Once | INTRAMUSCULAR | Status: AC
  Administered 2022-03-03: 20 mL via INTRADERMAL
  Filled 2022-03-03: qty 20

## 2022-03-03 MED ORDER — FENTANYL CITRATE PF 50 MCG/ML IJ SOSY
50.0000 ug | PREFILLED_SYRINGE | Freq: Once | INTRAMUSCULAR | Status: AC
  Administered 2022-03-03: 50 ug via INTRAVENOUS
  Filled 2022-03-03: qty 1

## 2022-03-03 MED ORDER — DEXTROSE 5 % IV BOLUS
1000.0000 mL | Freq: Once | INTRAVENOUS | Status: AC
  Administered 2022-03-03: 1000 mL via INTRAVENOUS

## 2022-03-03 NOTE — ED Provider Notes (Signed)
..  Laceration Repair  Date/Time: 03/03/2022 10:00 PM  Performed by: Donzetta Matters, MD Authorized by: Kemper Durie, DO   Consent:    Consent obtained:  Verbal   Consent given by:  Patient   Risks discussed:  Infection, pain, retained foreign body, tendon damage, poor cosmetic result, need for additional repair, nerve damage, poor wound healing and vascular damage   Alternatives discussed:  No treatment Universal protocol:    Patient identity confirmed:  Verbally with patient and arm band Anesthesia:    Anesthesia method:  Local infiltration   Local anesthetic:  Lidocaine 2% WITH epi Laceration details:    Location:  Scalp   Scalp location:  Crown   Length (cm):  10   Depth (mm):  2 Pre-procedure details:    Preparation:  Patient was prepped and draped in usual sterile fashion Exploration:    Hemostasis achieved with:  Epinephrine and direct pressure   Wound extent: fascia not violated and no nerve damage     Contaminated: no   Treatment:    Area cleansed with:  Saline   Amount of cleaning:  Extensive   Irrigation solution:  Sterile saline   Irrigation volume:  243ml   Irrigation method:  Syringe   Debridement:  None   Undermining:  None   Scar revision: no   Skin repair:    Repair method:  Staples   Number of staples:  16 Approximation:    Approximation:  Close Repair type:    Repair type:  Simple Post-procedure details:    Dressing:  Antibiotic ointment   Procedure completion:  Jodene Nam, MD 03/03/22 [place], [place], DO 03/03/22 2351

## 2022-03-03 NOTE — ED Notes (Signed)
Md informed that patient requesting pain medication. Patient given urinal.

## 2022-03-03 NOTE — ED Triage Notes (Signed)
Patient present after mechanical fall. Pt denies LOC. Pt admits to ETOH use and is on Eliquis.

## 2022-03-03 NOTE — Discharge Instructions (Signed)
You were seen in the emergency department after your fall.  Your workup showed mild dehydration and mildly low potassium we gave you some fluids and potassium supplement in the ER.  You had no injuries from her fall.  You did have some lacerations that did require repair.  Stitches on your face should be removed in 3 to 5 days and the staples in your scalp can be removed in 7 days.  These can be moved with your primary doctor, urgent care or back in the ER.  You should follow-up with your primary doctor or your cardiologist for your dizziness to be rechecked.  You should return to the emergency department if you pass out, you have severe chest pain, you have pus draining from your wounds or if you have any other new or concerning symptoms.

## 2022-03-03 NOTE — ED Notes (Signed)
Patient wound irrigated. Suture cart placed at bedside.

## 2022-03-03 NOTE — ED Provider Notes (Signed)
[facility] Provider Note   CSN: [id number] Arrival date & time: 03/03/22  1713     History  Chief Complaint  Patient presents with   Fall    On eliquis, +ETOH    Brian [name] is a 52 y.o. male.  Patient is a 52 year old male with a past medical history of aortic aneurysm status post repair, PE on Eliquis, COPD, depression, alcohol use disorder presenting to the emergency department after a fall.  The patient states that he always has some dizziness due to his cardiac medication and states that he was feeling lightheaded today which caused him to lose his balance and fall.  He states that he hit his head but did not lose consciousness.  States that he is having pain on top of his head but denies any neck pain, pain in his arms or legs, numbness or weakness.  Denies any nausea or vomiting.  He states that he did drink some alcohol today.  He denies any chest pain or shortness of breath prior to the fall.  The history is provided by the patient.  Fall       Home Medications Prior to Admission medications   Medication Sig Start Date End Date Taking? Authorizing Provider  albuterol (PROVENTIL HFA;VENTOLIN HFA) 108 (90 Base) MCG/ACT inhaler Inhale 1 puff into the lungs every 4 (four) hours as needed for wheezing or shortness of breath.    [provider]  atorvastatin (LIPITOR) 20 MG tablet Take 20 mg by mouth daily. 11/03/21   [provider]  Budesonide-Formoterol Fumarate (SYMBICORT IN) Inhale 1 puff into the lungs 2 (two) times daily.    [provider]  carvedilol (COREG) 12.5 MG tablet Take 12.5 mg by mouth 2 (two) times daily. 12/13/21   [provider]  clonazePAM (KLONOPIN) 1 MG tablet Take 1 tablet by mouth 3 (three) times daily as needed. 02/16/16   [provider]  doxycycline (VIBRAMYCIN) 100 MG capsule Take 1 capsule (100 mg total) by mouth every 12 (twelve) hours. 03/02/22    Billey Co, MD  ELIQUIS 2.5 MG TABS tablet Take 2.5 mg by mouth 2 (two) times daily. 11/06/21   [provider]  fenofibrate (TRICOR) 145 MG tablet Take 145 mg by mouth daily. 06/10/21   [provider]  FOCALIN XR 20 MG 24 hr capsule Take 20 mg by mouth every evening. 10/04/21   [provider]  FOCALIN XR 30 MG CP24 Take 1 capsule by mouth every morning. 11/04/21   [provider]  furosemide (LASIX) 20 MG tablet Take 20 mg by mouth daily. 11/06/21   [provider]  lipase/protease/amylase (CREON) 36000 UNITS CPEP capsule Take 1 capsule by mouth. 2 cap qam and 2 cap at noon , 2 cap qpm and 1 cap qhs = 7 caps a day 07/29/20   [provider]  omeprazole (PRILOSEC) 20 MG capsule Take 20 mg by mouth daily. 11/14/21   [provider]  ondansetron (ZOFRAN-ODT) 8 MG disintegrating tablet Take 8 mg by mouth 2 (two) times daily. 12/13/21   [provider]  oxyCODONE (ROXICODONE) 5 MG immediate release tablet Take 1 tablet (5 mg total) by mouth daily as needed for severe pain. 12/30/21   Geronimo Boot, PA-C  sertraline (ZOLOFT) 100 MG tablet Take 200 mg by mouth daily. 02/16/16   [provider]  SYMBICORT 80-4.5 MCG/ACT inhaler Inhale 2 puffs into the lungs at bedtime as needed. 12/07/21  [provider]  tiZANidine (ZANAFLEX) 2 MG tablet TAKE 1 TABLET(2 MG) BY MOUTH EVERY 8 HOURS AS NEEDED FOR MUSCLE SPASMS 12/16/21   Geronimo Boot, PA-C      Allergies    Metoprolol, Lithium, Valproic acid, Depakote [divalproex sodium], Nsaids, Penicillins, and Sulfa antibiotics    Review of Systems   Review of Systems  Physical Exam Updated Vital Signs BP (!) 122/99   Pulse 91   Temp 98 F (36.7 C)   Resp 11   Wt 99.8 kg   SpO2 100%   BMI 29.03 kg/m  Physical Exam Vitals and nursing note reviewed.  Constitutional:      General: He is not in acute distress.    Appearance: Normal appearance.  HENT:     Head:  Normocephalic.     Comments: ~2 cm laceration above L ebrow Abrasion to bridge of nose ~0.5 cm laceration to mid forehead with underlying hematoma ~8 cm oozing laceration to scalp    Nose: Nose normal.     Mouth/Throat:     Mouth: Mucous membranes are moist.     Pharynx: Oropharynx is clear.  Eyes:     Extraocular Movements: Extraocular movements intact.     Conjunctiva/sclera: Conjunctivae normal.     Pupils: Pupils are equal, round, and reactive to light.  Neck:     Comments: No midline neck tenderness Cardiovascular:     Rate and Rhythm: Normal rate and regular rhythm.     Pulses: Normal pulses.     Heart sounds: Normal heart sounds.  Pulmonary:     Effort: Pulmonary effort is normal.     Breath sounds: Normal breath sounds.  Abdominal:     General: Abdomen is flat.     Palpations: Abdomen is soft.     Tenderness: There is no abdominal tenderness.  Musculoskeletal:        General: Normal range of motion.     Cervical back: Normal range of motion and neck supple.     Comments: No midline back tenderness No bony tenderness to bilateral upper or lower extremities Pelvis stable, nontender  Skin:    General: Skin is warm and dry.  Neurological:     General: No focal deficit present.     Mental Status: He is alert and oriented to person, place, and time.     Sensory: No sensory deficit.     Motor: No weakness.  Psychiatric:        Mood and Affect: Mood normal.        Behavior: Behavior normal.     ED Results / Procedures / Treatments   Labs (all labs ordered are listed, but only abnormal results are displayed) Labs Reviewed  ETHANOL - Abnormal; Notable for the following components:      Result Value   Alcohol, Ethyl (B) 88 (*)    All other components within normal limits  COMPREHENSIVE METABOLIC PANEL - Abnormal; Notable for the following components:   Sodium 134 (*)    Potassium 3.3 (*)    Chloride 96 (*)    CO2 21 (*)    Glucose, Bld 69 (*)    Calcium 8.0 (*)     AST 52 (*)    Total Bilirubin 1.4 (*)    Anion gap 17 (*)    All other components within normal limits  MAGNESIUM - Abnormal; Notable for the following components:   Magnesium 1.3 (*)    All other components within normal limits  CBC WITH  DIFFERENTIAL/PLATELET  PHOSPHORUS    EKG EKG Interpretation  Date/Time:  Friday March 03 2022 17:20:19 EST Ventricular Rate:  89 PR Interval:  166 QRS Duration: 97 QT Interval:  419 QTC Calculation: 510 R Axis:   39 Text Interpretation: Sinus rhythm Prolonged QT interval Otherwise no significant change Confirmed by Leanord Asal ([id number]) on 03/03/2022 5:55:28 PM  Radiology DG Chest Port 1 View  Result Date: 03/03/2022 CLINICAL DATA:  Syncope EXAM: PORTABLE CHEST 1 VIEW COMPARISON:  12/15/2021 FINDINGS: Telemetry leads overlie the chest. The cardiomediastinal silhouette is unchanged. Previous median sternotomy again noted. There is no evidence of focal airspace disease, pulmonary edema, suspicious pulmonary nodule/mass, pleural effusion, or pneumothorax. No acute bony abnormalities are identified. IMPRESSION: No active disease. Electronically Signed   By: Margarette Canada M.D.   On: 03/03/2022 18:27   CT Head Wo Contrast  Result Date: 03/03/2022 CLINICAL DATA:  Head trauma, coagulopathy. EXAM: CT HEAD WITHOUT CONTRAST TECHNIQUE: Contiguous axial images were obtained from the base of the skull through the vertex without intravenous contrast. RADIATION DOSE REDUCTION: This exam was performed according to the departmental dose-optimization program which includes automated exposure control, adjustment of the mA and/or kV according to patient size and/or use of iterative reconstruction technique. COMPARISON:  CT head 11/04/2021 FINDINGS: Brain: No evidence of acute infarction, hemorrhage, hydrocephalus, extra-axial collection or mass lesion/mass effect. Vascular: No hyperdense vessel or unexpected calcification. Skull: Normal. Negative for fracture or focal  lesion. Sinuses/Orbits: No acute finding. Other: There is left frontal scalp laceration. IMPRESSION: No acute intracranial abnormality. Electronically Signed   By: Ronney Asters M.D.   On: 03/03/2022 17:56    Procedures Procedures    Medications Ordered in ED Medications  oxyCODONE-acetaminophen (PERCOCET/ROXICET) 5-325 MG per tablet 1 tablet (1 tablet Oral Given 03/03/22 1833)  lidocaine (PF) (XYLOCAINE) 1 % injection 30 mL (30 mLs Infiltration Given 03/03/22 2000)  fentaNYL (SUBLIMAZE) injection 50 mcg (50 mcg Intravenous Given 03/03/22 2106)  lidocaine-EPINEPHrine (XYLOCAINE W/EPI) 2 %-1:200000 (PF) injection 20 mL (20 mLs Intradermal Given 03/03/22 2107)  potassium chloride SA (KLOR-CON M) CR tablet 40 mEq (40 mEq Oral Given 03/03/22 2208)  dextrose 5 % bolus 1,000 mL (1,000 mLs Intravenous New Bag/Given 03/03/22 2213)    ED Course/ Medical Decision Making/ A&P Clinical Course as of 03/03/22 2259  Fri Mar 03, 2022  2255 Patient's workup showed mild hypokalemia which was repleted as well as mild hypoglycemia with an anion gap of 17 concerning for possible alcohol versus starvation ketoacidosis and was given a liter of D5.  Patient is otherwise asymptomatic in no acute distress.  Lacerations were repaired by resident, please see his procedure note.  Patient is stable for discharge home with primary care follow-up and was given strict return precautions. [VK]    Clinical Course User Index [VK] Kemper Durie, DO                             Medical Decision Making This patient presents to the ED with chief complaint(s) of fall with pertinent past medical history of aortic aneurysm status post repair, PE on Eliquis, alcohol use disorder which further complicates the presenting complaint. The complaint involves an extensive differential diagnosis and also carries with it a high risk of complications and morbidity.    The differential diagnosis includes ACS, arrhythmia, anemia, electrolyte  abnormality, dehydration, intoxication, ICH, mass effect, patient has evidence of lacerations and abrasions  Additional history obtained: Additional  history obtained from EMS  Records reviewed previous admission documents  ED Course and Reassessment: Patient was awake alert well-appearing on arrival no acute distress.  Due to his fall on thinners he will have a head CT performed.  He does have multiple lacerations to his face and scalp that will require repair.  Per patient's records, tetanus is up-to-date and does not require an update today.  Due to patient feeling lightheadedness prior to the fall, he will have EKG and labs performed to evaluate for possible presyncopal fall.  Independent labs interpretation:  The following labs were independently interpreted: Mild anion gap of 17 and mildly low glucose and potassium concerning for possible alcohol ketoacidosis or starvation ketosis  Independent visualization of imaging: - I independently visualized the following imaging with scope of interpretation limited to determining acute life threatening conditions related to emergency care: Head CT, chest x-ray, which revealed no acute disease  Consultation: - Consulted or discussed management/test interpretation w/ external professional: N/A  Consideration for admission or further workup: Patient has no emergent conditions requiring admission or further work-up at this time and is stable for discharge home with primary care follow-up  Social Determinants of health: N/A     Amount and/or Complexity of Data Reviewed Labs: ordered. Radiology: ordered.  Risk Prescription drug management.          Final Clinical Impression(s) / ED Diagnoses Final diagnoses:  Fall, initial encounter  Dizziness  Laceration of scalp, initial encounter  Facial laceration, initial encounter    Rx / DC Orders ED Discharge Orders     None         Kemper Durie, DO 03/03/22 2259

## 2022-03-03 NOTE — ED Provider Notes (Signed)
..  Laceration Repair  Date/Time: 03/03/2022 10:04 PM  Performed by: Donzetta Matters, MD Authorized by: Kemper Durie, DO   Consent:    Consent obtained:  Verbal   Consent given by:  Patient   Risks discussed:  Infection, pain, retained foreign body, need for additional repair, poor cosmetic result, nerve damage and poor wound healing   Alternatives discussed:  No treatment Anesthesia:    Anesthesia method:  Local infiltration   Local anesthetic:  Lidocaine 2% WITH epi Laceration details:    Location:  Face   Face location:  L eyebrow   Length (cm):  5   Depth (mm):  1 Pre-procedure details:    Preparation:  Patient was prepped and draped in usual sterile fashion Exploration:    Contaminated: no   Treatment:    Area cleansed with:  Saline   Amount of cleaning:  Extensive   Irrigation solution:  Sterile saline   Irrigation volume:  200   Irrigation method:  Syringe   Visualized foreign bodies/material removed: no     Debridement:  None   Undermining:  None   Scar revision: no   Skin repair:    Repair method:  Sutures   Suture size:  6-0   Suture material:  Prolene   Suture technique:  Running   Number of sutures:  3 Approximation:    Approximation:  Close Repair type:    Repair type:  Simple Post-procedure details:    Dressing:  Antibiotic ointment   Procedure completion:  Jodene Nam, MD 03/04/22 0017    Leanord Asal K, DO 03/04/22 1510

## 2022-03-04 NOTE — ED Notes (Signed)
Patient unable to get cab voucher per management. Patient informed that he will be able to wait in the lobby until he is able to find a ride.

## 2022-04-11 NOTE — Progress Notes (Deleted)
04/12/2022 11:48 AM   Brian [name] 25-Aug-1970 [id number]  Referring provider: No referring provider defined for this encounter.  Urological history: 1. Penile injury -key lock stuck on his penis that he was using for masturbation in 2018, and Dr. Pilar Jarvis was consulted at that time and ultimately he needed to use a Dremel tool to cut and remove the lock  2. ED -contributing factors of age, penile injury, alcohol abuse, anxiety, depression, hypertension and hyperlipidemia -SHIM ***    No chief complaint on file.   HPI: Brian [name] is a 52 y.o. male who presents today for discussion regarding ED.    PMH: Past Medical History:  Diagnosis Date   ADHD    Alcohol abuse    Anxiety    Asthma    Congenital heart defect    Depression    Hyperlipidemia    Hypertension    Pulmonary thromboembolism ([place])    Seizure ([place])    Thoracic aortic aneurysm [facility])     Surgical History: Past Surgical History:  Procedure Laterality Date   artificial aorta and pig valve  02/25/2020   CARDIAC CATHETERIZATION     CHOLECYSTECTOMY  2004   COLONOSCOPY     DENTAL SURGERY     IR KYPHO THORACIC WITH BONE BIOPSY  12/13/2021   IR RADIOLOGIST EVAL & MGMT  11/22/2021   LIVER BIOPSY  2004    Home Medications:  Allergies as of 04/12/2022       Reactions   Metoprolol Anaphylaxis   AFFECTED MOTOR SKILLS Tolerated coreg 03/02/20   Lithium Other (See Comments)   Emotional issues admitted after taking   Valproic Acid Other (See Comments)   hallucinations   Depakote [divalproex Sodium] Anxiety   Nsaids Other (See Comments)   Gastritis Contraindicated due to Eliquis and Creon   Penicillins Rash   Tolerated Ancef February 2022   Sulfa Antibiotics Rash        Medication List        Accurate as of April 11, 2022 11:48 AM. If you have any questions, ask your nurse or doctor.          albuterol 108 (90 Base) MCG/ACT inhaler Commonly known as: VENTOLIN HFA Inhale 1 puff  into the lungs every 4 (four) hours as needed for wheezing or shortness of breath.   atorvastatin 20 MG tablet Commonly known as: LIPITOR Take 20 mg by mouth daily.   carvedilol 12.5 MG tablet Commonly known as: COREG Take 12.5 mg by mouth 2 (two) times daily.   clonazePAM 1 MG tablet Commonly known as: KLONOPIN Take 1 tablet by mouth 3 (three) times daily as needed.   Creon 36000 UNITS Cpep capsule Generic drug: lipase/protease/amylase Take 1 capsule by mouth. 2 cap qam and 2 cap at noon , 2 cap qpm and 1 cap qhs = 7 caps a day   doxycycline 100 MG capsule Commonly known as: VIBRAMYCIN Take 1 capsule (100 mg total) by mouth every 12 (twelve) hours.   Eliquis 2.5 MG Tabs tablet Generic drug: apixaban Take 2.5 mg by mouth 2 (two) times daily.   fenofibrate 145 MG tablet Commonly known as: TRICOR Take 145 mg by mouth daily.   Focalin XR 20 MG 24 hr capsule Generic drug: dexmethylphenidate Take 20 mg by mouth every evening.   Focalin XR 30 MG Cp24 Generic drug: Dexmethylphenidate HCl Take 1 capsule by mouth every morning.   furosemide 20 MG tablet Commonly known as: LASIX Take 20 mg by mouth  daily.   omeprazole 20 MG capsule Commonly known as: PRILOSEC Take 20 mg by mouth daily.   ondansetron 8 MG disintegrating tablet Commonly known as: ZOFRAN-ODT Take 8 mg by mouth 2 (two) times daily.   oxyCODONE 5 MG immediate release tablet Commonly known as: Roxicodone Take 1 tablet (5 mg total) by mouth daily as needed for severe pain.   sertraline 100 MG tablet Commonly known as: ZOLOFT Take 200 mg by mouth daily.   SYMBICORT IN Inhale 1 puff into the lungs 2 (two) times daily.   Symbicort 80-4.5 MCG/ACT inhaler Generic drug: budesonide-formoterol Inhale 2 puffs into the lungs at bedtime as needed.   tiZANidine 2 MG tablet Commonly known as: ZANAFLEX TAKE 1 TABLET(2 MG) BY MOUTH EVERY 8 HOURS AS NEEDED FOR MUSCLE SPASMS        Allergies:  Allergies   Allergen Reactions   Metoprolol Anaphylaxis    AFFECTED MOTOR SKILLS Tolerated coreg 03/02/20   Lithium Other (See Comments)    Emotional issues admitted after taking   Valproic Acid Other (See Comments)    hallucinations    Depakote [Divalproex Sodium] Anxiety   Nsaids Other (See Comments)    Gastritis Contraindicated due to Eliquis and Creon    Penicillins Rash    Tolerated Ancef February 2022    Sulfa Antibiotics Rash    Family History: No family history on file.  Social History:  reports that he has never smoked. He has never used smokeless tobacco. He reports current alcohol use. He reports that he does not use drugs.  ROS: Pertinent ROS in HPI  Physical Exam: There were no vitals taken for this visit.  Constitutional:  Well nourished. Alert and oriented, No acute distress. HEENT: [place] AT, moist mucus membranes.  Trachea midline, no masses. Cardiovascular: No clubbing, cyanosis, or edema. Respiratory: Normal respiratory effort, no increased work of breathing. GI: Abdomen is soft, non tender, non distended, no abdominal masses. Liver and spleen not palpable.  No hernias appreciated.  Stool sample for occult testing is not indicated.   GU: No CVA tenderness.  No bladder fullness or masses.  Patient with circumcised/uncircumcised phallus. ***Foreskin easily retracted***  Urethral meatus is patent.  No penile discharge. No penile lesions or rashes. Scrotum without lesions, cysts, rashes and/or edema.  Testicles are located scrotally bilaterally. No masses are appreciated in the testicles. Left and right epididymis are normal. Rectal: Patient with  normal sphincter tone. Anus and perineum without scarring or rashes. No rectal masses are appreciated. Prostate is approximately *** grams, *** nodules are appreciated. Seminal vesicles are normal. Skin: No rashes, bruises or suspicious lesions. Lymph: No cervical or inguinal adenopathy. Neurologic: Grossly intact, no focal deficits,  moving all 4 extremities. Psychiatric: Normal mood and affect.  Laboratory Data: Lab Results  Component Value Date   WBC 6.6 03/03/2022   HGB 13.7 03/03/2022   HCT 39.6 03/03/2022   MCV 85.5 03/03/2022   PLT 188 03/03/2022    Lab Results  Component Value Date   CREATININE 0.86 03/03/2022    Lab Results  Component Value Date   AST 52 (H) 03/03/2022   Lab Results  Component Value Date   ALT 33 03/03/2022    Urinalysis    Component Value Date/Time   COLORURINE AMBER (A) 12/15/2021 0401   APPEARANCEUR Hazy (A) 03/02/2022 1412   LABSPEC 1.020 12/15/2021 0401   PHURINE 5.0 12/15/2021 0401   GLUCOSEU Negative 03/02/2022 1412   HGBUR SMALL (A) 12/15/2021 0401   BILIRUBINUR  Negative 03/02/2022 1412   KETONESUR NEGATIVE 12/15/2021 0401   PROTEINUR 2+ (A) 03/02/2022 1412   PROTEINUR 100 (A) 12/15/2021 0401   NITRITE Negative 03/02/2022 1412   NITRITE NEGATIVE 12/15/2021 0401   LEUKOCYTESUR Trace (A) 03/02/2022 1412   LEUKOCYTESUR NEGATIVE 12/15/2021 0401  I have reviewed the labs.   Pertinent Imaging: N/A  Assessment & Plan:  ***  1. ED - I explained to the patient that in order to achieve an erection it takes good functioning of the nervous system (parasympathetic and rs, sympathetic, sensory and motor), good blood flow into the erectile tissue of the penis and a desire to have sex - I explained that conditions like diabetes, hypertension, coronary artery disease, peripheral vascular disease, smoking, alcohol consumption, age, sleep apnea and BPH can diminish the ability to have an erection - I explained the ED may be a risk marker for underlying CVD and he should follow up with PCP for further studies *** - we will obtain a serum testosterone level at this time; if it is abnormal we will need to repeat the study for confirmation *** - A recent study published in Sex Med 2018 Apr 13 revealed moderate to vigorous aerobic exercise for 40 minutes 4 times per week can  decrease erectile problems caused by physical inactivity, obesity, hypertension, metabolic syndrome and/or cardiovascular diseases *** - We discussed trying a *** different PDE5 inhibitor, intra-urethral suppositories, intracavernous vasoactive drug injection therapy, vacuum erection devices, LI-ESWT and penile prosthesis implantation    No follow-ups on file.  These notes generated with voice recognition software. I apologize for typographical errors.  [place], [place] [address]  [place], [place] [address] [phone]

## 2022-04-12 ENCOUNTER — Ambulatory Visit: Payer: Medicaid Other | Admitting: Urology

## 2022-04-12 ENCOUNTER — Encounter: Payer: Self-pay | Admitting: Urology

## 2022-04-12 DIAGNOSIS — N529 Male erectile dysfunction, unspecified: Secondary | ICD-10-CM

## 2022-04-14 ENCOUNTER — Ambulatory Visit (INDEPENDENT_AMBULATORY_CARE_PROVIDER_SITE_OTHER): Payer: Medicaid Other | Admitting: Urology

## 2022-04-14 ENCOUNTER — Encounter: Payer: Self-pay | Admitting: Urology

## 2022-04-14 VITALS — BP 142/97 | HR 84 | Ht 73.0 in | Wt 215.0 lb

## 2022-04-14 DIAGNOSIS — N5201 Erectile dysfunction due to arterial insufficiency: Secondary | ICD-10-CM | POA: Diagnosis not present

## 2022-04-14 MED ORDER — SILDENAFIL CITRATE 20 MG PO TABS: ORAL_TABLET | ORAL | 3 refills | Status: DC
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
## 2022-04-19 ENCOUNTER — Ambulatory Visit: Payer: Medicaid Other | Admitting: Urology

## 2022-04-21 ENCOUNTER — Telehealth: Payer: Self-pay | Admitting: Orthopedic Surgery

## 2022-04-21 NOTE — Telephone Encounter (Signed)
Patient left message on office voice mail 04/20/22 at 5:18pm  He is having dental procedures done. He needs pain medication. Geronimo Boot is the only provider that can prescribe him pain medication per medicaid. He can only use the pharmacy on [address] get the verbal order from the dentist and then call the prescription?

## 2022-04-21 NOTE — Telephone Encounter (Signed)
Patient notified he will contact medicaid again.

## 2022-04-21 NOTE — Telephone Encounter (Signed)
I am sorry, but I cannot call in any pain medications for him. He will need to get this changed in the medicaid system.

## 2022-04-21 NOTE — Telephone Encounter (Signed)
Left message to call back  

## 2022-05-10 ENCOUNTER — Telehealth: Payer: Self-pay | Admitting: Urology
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
## 2022-05-12 ENCOUNTER — Other Ambulatory Visit: Payer: Self-pay

## 2022-05-12 ENCOUNTER — Encounter: Payer: Self-pay | Admitting: Intensive Care

## 2022-05-12 ENCOUNTER — Ambulatory Visit: Payer: Medicaid Other | Admitting: Physician Assistant

## 2022-05-12 ENCOUNTER — Emergency Department
Admission: EM | Admit: 2022-05-12 | Discharge: 2022-05-12 | Disposition: A | Discharge status: Home / Self Care | Payer: Medicaid Other | Attending: Emergency Medicine | Admitting: Emergency Medicine

## 2022-05-12 DIAGNOSIS — L0889 Other specified local infections of the skin and subcutaneous tissue: Secondary | ICD-10-CM | POA: Diagnosis not present

## 2022-05-12 DIAGNOSIS — L089 Local infection of the skin and subcutaneous tissue, unspecified: Secondary | ICD-10-CM

## 2022-05-12 DIAGNOSIS — X158XXA Contact with other hot household appliances, initial encounter: Secondary | ICD-10-CM | POA: Insufficient documentation

## 2022-05-12 DIAGNOSIS — T2116XA Burn of first degree of male genital region, initial encounter: Secondary | ICD-10-CM

## 2022-05-12 DIAGNOSIS — I10 Essential (primary) hypertension: Secondary | ICD-10-CM | POA: Insufficient documentation

## 2022-05-12 DIAGNOSIS — T2106XA Burn of unspecified degree of male genital region, initial encounter: Secondary | ICD-10-CM | POA: Diagnosis present

## 2022-05-12 DIAGNOSIS — Z7901 Long term (current) use of anticoagulants: Secondary | ICD-10-CM | POA: Insufficient documentation

## 2022-05-12 LAB — URINALYSIS, ROUTINE W REFLEX MICROSCOPIC
Bilirubin Urine: NEGATIVE
Glucose, UA: NEGATIVE mg/dL
Hgb urine dipstick: NEGATIVE
Ketones, ur: NEGATIVE mg/dL
Nitrite: NEGATIVE
Protein, ur: NEGATIVE mg/dL
Specific Gravity, Urine: 1.008 (ref 1.005–1.030)
Squamous Epithelial / HPF: NONE SEEN /HPF (ref 0–5)
pH: 6 (ref 5.0–8.0)

## 2022-05-12 LAB — CHLAMYDIA/NGC RT PCR (ARMC ONLY)
Chlamydia Tr: NOT DETECTED
N gonorrhoeae: NOT DETECTED

## 2022-05-12 MED ORDER — CEPHALEXIN 500 MG PO CAPS: 500.0000 mg | ORAL_CAPSULE | Freq: Four times a day (QID) | ORAL | 0 refills | Status: AC

## 2022-05-12 MED ORDER — CEPHALEXIN 500 MG PO CAPS: 500.0000 mg | ORAL_CAPSULE | Freq: Four times a day (QID) | ORAL | 0 refills | Status: DC

## 2022-05-12 NOTE — ED Triage Notes (Signed)
Patient presents with wound on side of penis. Reports yellowish/green pus from area
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
## 2022-05-16 ENCOUNTER — Telehealth: Payer: Self-pay | Admitting: Cardiovascular Disease

## 2022-05-16 ENCOUNTER — Encounter: Payer: Self-pay | Admitting: Urology

## 2022-05-16 ENCOUNTER — Ambulatory Visit: Payer: Medicaid Other | Admitting: Cardiovascular Disease

## 2022-05-16 ENCOUNTER — Ambulatory Visit (INDEPENDENT_AMBULATORY_CARE_PROVIDER_SITE_OTHER): Payer: Medicaid Other | Admitting: Urology

## 2022-05-16 ENCOUNTER — Encounter: Payer: Self-pay | Admitting: Cardiovascular Disease

## 2022-05-16 VITALS — BP 100/62 | HR 112 | Ht 73.0 in | Wt 223.4 lb

## 2022-05-16 VITALS — BP 92/64 | HR 98 | Ht 73.0 in | Wt 225.0 lb

## 2022-05-16 DIAGNOSIS — N5201 Erectile dysfunction due to arterial insufficiency: Secondary | ICD-10-CM

## 2022-05-16 DIAGNOSIS — T2106XA Burn of unspecified degree of male genital region, initial encounter: Secondary | ICD-10-CM

## 2022-05-16 DIAGNOSIS — T300 Burn of unspecified body region, unspecified degree: Secondary | ICD-10-CM

## 2022-05-16 DIAGNOSIS — I483 Typical atrial flutter: Secondary | ICD-10-CM

## 2022-05-16 DIAGNOSIS — I7121 Aneurysm of the ascending aorta, without rupture: Secondary | ICD-10-CM | POA: Diagnosis not present

## 2022-05-16 DIAGNOSIS — I10 Essential (primary) hypertension: Secondary | ICD-10-CM

## 2022-05-16 DIAGNOSIS — Z952 Presence of prosthetic heart valve: Secondary | ICD-10-CM

## 2022-05-16 DIAGNOSIS — Z01818 Encounter for other preprocedural examination: Secondary | ICD-10-CM | POA: Diagnosis not present

## 2022-05-16 DIAGNOSIS — Z86718 Personal history of other venous thrombosis and embolism: Secondary | ICD-10-CM

## 2022-05-16 MED ORDER — AMIODARONE HCL 400 MG PO TABS: 400.0000 mg | ORAL_TABLET | Freq: Two times a day (BID) | ORAL | 0 refills | Status: DC
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
## 2022-05-16 NOTE — Addendum Note (Signed)
Addended by: Adrian Blackwater A on: 05/16/2022 11:51 AM   Modules accepted: Orders

## 2022-05-16 NOTE — Telephone Encounter (Signed)
Patient called and said he is having side effects from the amiodarone that he was prescribed today. He is wanting to know if we can try him on something different for Afib.   He also disagrees with the findings on his EKG today. He does not believe that he was in Afib when he was in our office today.   I advised him that if he had so many concerns still that he should schedule another appt with Dr. Welton Flakes to discuss medications and his concerns.
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
## 2022-05-16 NOTE — Telephone Encounter (Signed)
Patient called and spoke with Lurena Joiner and was very rude, kept cutting her off. She transferred him to me, patient was very argumentative stating that his EKG was normal before so why wasn't It normal now. He states that the Amiodarone that he was given three hours prior gave him posterial hypertension but when asked him about that he states that's from years ago. He claims that after taking the medication is caused his BP to bottom out, when asked about what it was it was 101/79. I told him this was normal but maybe not his normal if his BP has been running high. 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 Pt states that Dr Welton Flakes made him feel "indifferent, didn't listen to him and felt like he was a know it all" but he kept telling him about the function of his heart and what's causing it to be in AFIB, pt unsure if he will be coming back here to our practice or not

## 2022-05-17 ENCOUNTER — Ambulatory Visit: Payer: Medicaid Other | Admitting: Cardiovascular Disease

## 2022-05-17 LAB — BASIC METABOLIC PANEL
BUN/Creatinine Ratio: 14 (ref 9–20)
BUN: 17 mg/dL (ref 6–24)
CO2: 22 mmol/L (ref 20–29)
Calcium: 9.3 mg/dL (ref 8.7–10.2)
Chloride: 96 mmol/L (ref 96–106)
Creatinine, Ser: 1.2 mg/dL (ref 0.76–1.27)
Glucose: 92 mg/dL (ref 70–99)
Potassium: 4.3 mmol/L (ref 3.5–5.2)
Sodium: 139 mmol/L (ref 134–144)
eGFR: 73 mL/min/{1.73_m2} (ref >59)

## 2022-05-18 ENCOUNTER — Encounter: Payer: Self-pay | Admitting: Cardiovascular Disease

## 2022-05-22 ENCOUNTER — Telehealth: Payer: Self-pay

## 2022-05-22 NOTE — Telephone Encounter (Signed)
FYI Patient left a message on triage line to update Carollee Herter that his [facility] appointment on 05/19/22 went well and he will be following up with their clinic in 2 weeks. [facility] notes are in media. Patient said if he needs to follow up with [place] to just let him know He thanked Carollee Herter for all her help.

## 2022-05-23 ENCOUNTER — Other Ambulatory Visit: Payer: Medicaid Other

## 2022-05-24 ENCOUNTER — Ambulatory Visit: Payer: Medicaid Other | Admitting: Internal Medicine

## 2022-06-02 ENCOUNTER — Other Ambulatory Visit: Payer: Medicaid Other

## 2022-06-15 ENCOUNTER — Ambulatory Visit: Payer: Medicaid Other | Admitting: Cardiovascular Disease

## 2022-06-21 ENCOUNTER — Other Ambulatory Visit: Payer: Self-pay | Admitting: Cardiovascular Disease

## 2022-06-21 DIAGNOSIS — Z01818 Encounter for other preprocedural examination: Secondary | ICD-10-CM

## 2022-06-21 DIAGNOSIS — Z952 Presence of prosthetic heart valve: Secondary | ICD-10-CM

## 2022-06-21 DIAGNOSIS — I10 Essential (primary) hypertension: Secondary | ICD-10-CM

## 2022-06-21 DIAGNOSIS — I483 Typical atrial flutter: Secondary | ICD-10-CM

## 2022-07-11 ENCOUNTER — Ambulatory Visit: Payer: Medicaid Other | Admitting: Urology

## 2022-07-17 ENCOUNTER — Ambulatory Visit: Payer: Medicaid Other | Admitting: Physician Assistant

## 2022-07-24 ENCOUNTER — Ambulatory Visit: Payer: Medicaid Other | Admitting: Physician Assistant

## 2022-07-24 ENCOUNTER — Encounter: Payer: Self-pay | Admitting: Physician Assistant

## 2022-07-24 VITALS — BP 88/58 | HR 81 | Wt 225.0 lb

## 2022-07-24 DIAGNOSIS — N5201 Erectile dysfunction due to arterial insufficiency: Secondary | ICD-10-CM

## 2022-07-24 DIAGNOSIS — T300 Burn of unspecified body region, unspecified degree: Secondary | ICD-10-CM

## 2022-07-24 MED ORDER — SILDENAFIL CITRATE 20 MG PO TABS: ORAL_TABLET | ORAL | 6 refills | Status: DC

## 2022-07-24 MED ORDER — SILDENAFIL CITRATE 20 MG PO TABS
ORAL_TABLET | ORAL | 6 refills | Status: DC
Start: 2022-07-24 — End: 2022-09-27
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
## 2022-07-24 NOTE — Addendum Note (Signed)
Addended by: Marchelle Folks on: 07/24/2022 02:57 PM   Modules accepted: Orders

## 2022-09-24 ENCOUNTER — Other Ambulatory Visit: Payer: Self-pay

## 2022-09-24 ENCOUNTER — Encounter: Payer: Self-pay | Admitting: Emergency Medicine

## 2022-09-24 ENCOUNTER — Emergency Department
Admission: EM | Admit: 2022-09-24 | Discharge: 2022-09-24 | Disposition: A | Discharge status: Home / Self Care | Payer: Medicaid Other | Attending: Emergency Medicine | Admitting: Emergency Medicine

## 2022-09-24 DIAGNOSIS — X153XXA Contact with hot saucepan or skillet, initial encounter: Secondary | ICD-10-CM | POA: Diagnosis not present

## 2022-09-24 DIAGNOSIS — T23252A Burn of second degree of left palm, initial encounter: Secondary | ICD-10-CM | POA: Diagnosis present

## 2022-09-24 DIAGNOSIS — I10 Essential (primary) hypertension: Secondary | ICD-10-CM | POA: Insufficient documentation

## 2022-09-24 DIAGNOSIS — T23209A Burn of second degree of unspecified hand, unspecified site, initial encounter: Secondary | ICD-10-CM

## 2022-09-24 MED ORDER — HYDROCODONE-ACETAMINOPHEN 5-325 MG PO TABS: 1.0000 | ORAL_TABLET | Freq: Four times a day (QID) | ORAL | 0 refills | Status: DC | PRN

## 2022-09-24 MED ORDER — ACETAMINOPHEN 325 MG PO TABS
650.0000 mg | ORAL_TABLET | Freq: Once | ORAL | Status: AC
  Administered 2022-09-24: 650 mg via ORAL
  Filled 2022-09-24: qty 2

## 2022-09-24 MED ORDER — BACITRACIN ZINC 500 UNIT/GM EX OINT
TOPICAL_OINTMENT | Freq: Two times a day (BID) | CUTANEOUS | Status: DC
  Filled 2022-09-24: qty 0.9
  Filled 2022-09-24: qty 1.8

## 2022-09-24 MED ORDER — BACITRACIN 500 UNIT/GM EX OINT: 1.0000 | TOPICAL_OINTMENT | Freq: Two times a day (BID) | CUTANEOUS | 0 refills | Status: DC
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
## 2022-09-26 ENCOUNTER — Other Ambulatory Visit: Payer: Self-pay | Admitting: Physician Assistant

## 2022-09-26 DIAGNOSIS — N5201 Erectile dysfunction due to arterial insufficiency: Secondary | ICD-10-CM

## 2023-01-12 ENCOUNTER — Other Ambulatory Visit: Payer: Self-pay | Admitting: Physician Assistant

## 2023-01-12 DIAGNOSIS — N5201 Erectile dysfunction due to arterial insufficiency: Secondary | ICD-10-CM
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
## 2023-01-31 ENCOUNTER — Ambulatory Visit: Payer: Medicaid Other | Admitting: Urology

## 2023-01-31 DIAGNOSIS — N5201 Erectile dysfunction due to arterial insufficiency: Secondary | ICD-10-CM

## 2023-01-31 DIAGNOSIS — T300 Burn of unspecified body region, unspecified degree: Secondary | ICD-10-CM

## 2023-02-02 ENCOUNTER — Encounter: Payer: Self-pay | Admitting: Urology
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
## 2023-02-14 ENCOUNTER — Ambulatory Visit: Payer: Medicaid Other | Admitting: Urology

## 2023-02-14 DIAGNOSIS — T300 Burn of unspecified body region, unspecified degree: Secondary | ICD-10-CM

## 2023-02-14 DIAGNOSIS — N5201 Erectile dysfunction due to arterial insufficiency: Secondary | ICD-10-CM
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
## 2023-02-21 ENCOUNTER — Ambulatory Visit: Payer: Medicaid Other | Admitting: Urology

## 2023-02-21 ENCOUNTER — Encounter: Payer: Self-pay | Admitting: Urology

## 2023-02-21 VITALS — BP 143/91 | HR 73

## 2023-02-21 DIAGNOSIS — N5201 Erectile dysfunction due to arterial insufficiency: Secondary | ICD-10-CM | POA: Diagnosis not present

## 2023-02-21 MED ORDER — TADALAFIL 5 MG PO TABS: 5.0000 mg | ORAL_TABLET | Freq: Every day | ORAL | 0 refills | Status: DC | PRN

## 2023-02-22 LAB — TESTOSTERONE: Testosterone: 634 ng/dL (ref 264–916)

## 2023-02-23 ENCOUNTER — Telehealth: Payer: Self-pay

## 2023-02-23 NOTE — Telephone Encounter (Signed)
Pt calls triage line and states he is unable to access his mychart and is calling for the results of his testosterone. Provided pt with lab results. Lengthy conversation had with patient in regard to our office policies surrounding testosterone. Pt voiced understanding.

## 2023-04-11 ENCOUNTER — Emergency Department (HOSPITAL_COMMUNITY)

## 2023-04-11 ENCOUNTER — Encounter (HOSPITAL_COMMUNITY): Payer: Self-pay

## 2023-04-11 ENCOUNTER — Emergency Department (HOSPITAL_COMMUNITY)
Admission: EM | Admit: 2023-04-11 | Discharge: 2023-04-12 | Disposition: A | Discharge status: Home / Self Care | Attending: Emergency Medicine | Admitting: Emergency Medicine

## 2023-04-11 ENCOUNTER — Other Ambulatory Visit: Payer: Self-pay

## 2023-04-11 DIAGNOSIS — Y908 Blood alcohol level of 240 mg/100 ml or more: Secondary | ICD-10-CM | POA: Diagnosis not present

## 2023-04-11 DIAGNOSIS — I10 Essential (primary) hypertension: Secondary | ICD-10-CM | POA: Diagnosis not present

## 2023-04-11 DIAGNOSIS — W19XXXA Unspecified fall, initial encounter: Secondary | ICD-10-CM

## 2023-04-11 DIAGNOSIS — S0181XA Laceration without foreign body of other part of head, initial encounter: Secondary | ICD-10-CM

## 2023-04-11 DIAGNOSIS — S01111A Laceration without foreign body of right eyelid and periocular area, initial encounter: Secondary | ICD-10-CM | POA: Insufficient documentation

## 2023-04-11 DIAGNOSIS — F1090 Alcohol use, unspecified, uncomplicated: Secondary | ICD-10-CM | POA: Diagnosis not present

## 2023-04-11 DIAGNOSIS — T465X5A Adverse effect of other antihypertensive drugs, initial encounter: Secondary | ICD-10-CM | POA: Insufficient documentation

## 2023-04-11 DIAGNOSIS — Y92009 Unspecified place in unspecified non-institutional (private) residence as the place of occurrence of the external cause: Secondary | ICD-10-CM

## 2023-04-11 DIAGNOSIS — S0993XA Unspecified injury of face, initial encounter: Secondary | ICD-10-CM | POA: Diagnosis present

## 2023-04-11 DIAGNOSIS — I952 Hypotension due to drugs: Secondary | ICD-10-CM

## 2023-04-11 DIAGNOSIS — E876 Hypokalemia: Secondary | ICD-10-CM | POA: Insufficient documentation

## 2023-04-11 DIAGNOSIS — N289 Disorder of kidney and ureter, unspecified: Secondary | ICD-10-CM | POA: Insufficient documentation

## 2023-04-11 HISTORY — DX: Essential (primary) hypertension: I10

## 2023-04-11 HISTORY — DX: Anxiety disorder, unspecified: F41.9

## 2023-04-11 HISTORY — DX: Other specified behavioral and emotional disorders with onset usually occurring in childhood and adolescence: F98.8

## 2023-04-11 HISTORY — DX: Unspecified atrial fibrillation: I48.91

## 2023-04-11 HISTORY — DX: Alcohol abuse, in remission: F10.11

## 2023-04-11 LAB — I-STAT CHEM 8, ED
BUN: 11 mg/dL (ref 6–20)
Calcium, Ion: 0.96 mmol/L — ABNORMAL LOW (ref 1.15–1.40)
Chloride: 99 mmol/L (ref 98–111)
Creatinine, Ser: 1.7 mg/dL — ABNORMAL HIGH (ref 0.61–1.24)
Glucose, Bld: 103 mg/dL — ABNORMAL HIGH (ref 70–99)
HCT: 48 % (ref 39.0–52.0)
Hemoglobin: 16.3 g/dL (ref 13.0–17.0)
Potassium: 3.4 mmol/L — ABNORMAL LOW (ref 3.5–5.1)
Sodium: 139 mmol/L (ref 135–145)
TCO2: 22 mmol/L (ref 22–32)

## 2023-04-11 LAB — I-STAT CG4 LACTIC ACID, ED: Lactic Acid, Venous: 3.3 mmol/L (ref 0.5–1.9)

## 2023-04-11 LAB — SAMPLE TO BLOOD BANK

## 2023-04-11 MED ORDER — LACTATED RINGERS IV BOLUS
2000.0000 mL | Freq: Once | INTRAVENOUS | Status: AC
  Administered 2023-04-11: 2000 mL via INTRAVENOUS

## 2023-04-11 MED ORDER — SODIUM CHLORIDE 0.9 % IV BOLUS: 2000.0000 mL | Freq: Once | INTRAVENOUS | Status: DC

## 2023-04-11 MED ORDER — LACTATED RINGERS IV BOLUS: 2000.0000 mL | Freq: Once | INTRAVENOUS | Status: DC

## 2023-04-11 NOTE — Consult Note (Signed)
 Reason for Consult:Level 1 fall on eliquis Referring Physician: Dr. Metta Clines is an 53 y.o. male.  HPI: PT is a 44M who fell from standing and struck his R face. Pt with lac to eyebrow. Denies LOC  PT on eliquis for valve replacement and aortic repair Patient with a history of hypertension.  Patient states he took all his BP medications tonight.  He states he usually does not do this.   Past Medical History:  Diagnosis Date   A-fib [facility])    ADD (attention deficit disorder)    Anxiety and depression    History of ETOH abuse    Hypertension      History reviewed. No pertinent family history.  Social History:  reports that he has never smoked. He has never used smokeless tobacco. He reports current alcohol use. He reports that he does not currently use drugs.  Allergies:  Allergies  Allergen Reactions   Depakote [Divalproex Sodium]     psychosis   Penicillins Hives    Medications: I have reviewed the patient's current medications.  Results for orders placed or performed during the hospital encounter of 04/11/23 (from the past 48 hours)  I-stat chem 8, ed     Status: Abnormal   Collection Time: 04/11/23 11:29 PM  Result Value Ref Range   Sodium 139 135 - 145 mmol/L   Potassium 3.4 (L) 3.5 - 5.1 mmol/L   Chloride 99 98 - 111 mmol/L   BUN 11 6 - 20 mg/dL   Creatinine, Ser 6.96 (H) 0.61 - 1.24 mg/dL   Glucose, Bld 295 (H) 70 - 99 mg/dL    Comment: Glucose reference range applies only to samples taken after fasting for at least 8 hours.   Calcium, Ion 0.96 (L) 1.15 - 1.40 mmol/L   TCO2 22 22 - 32 mmol/L   Hemoglobin 16.3 13.0 - 17.0 g/dL   HCT 28.4 13.2 - 44.0 %  I-Stat CG4 Lactic Acid, ED     Status: Abnormal   Collection Time: 04/11/23 11:29 PM  Result Value Ref Range   Lactic Acid, Venous 3.3 (HH) 0.5 - 1.9 mmol/L   Comment NOTIFIED PHYSICIAN     CT Head Wo Contrast Result Date: 04/11/2023 CLINICAL DATA:  Level 1 fall.  On blood thinners. EXAM:  CT HEAD WITHOUT CONTRAST CT MAXILLOFACIAL WITHOUT CONTRAST CT CERVICAL SPINE WITHOUT CONTRAST TECHNIQUE: Multidetector CT imaging of the head, cervical spine, and maxillofacial structures were performed using the standard protocol without intravenous contrast. Multiplanar CT image reconstructions of the cervical spine and maxillofacial structures were also generated. RADIATION DOSE REDUCTION: This exam was performed according to the departmental dose-optimization program which includes automated exposure control, adjustment of the mA and/or kV according to patient size and/or use of iterative reconstruction technique. COMPARISON:  CT head 03/03/2022 and CT cervical spine 11/04/2021 FINDINGS: CT HEAD FINDINGS Brain: No evidence of acute infarction, hemorrhage, hydrocephalus, extra-axial collection or mass lesion/mass effect. Vascular: No hyperdense vessel or unexpected calcification. Skull: Normal. Negative for fracture or focal lesion. Right forehead hematoma. Other: None. CT MAXILLOFACIAL FINDINGS Osseous: No fracture or mandibular dislocation. No destructive process. Orbits: Negative. No traumatic or inflammatory finding. Sinuses: Mild mucosal thickening in the ethmoid air cells and maxillary sinuses. No acute abnormality. Soft tissues: Negative. CT CERVICAL SPINE FINDINGS Alignment: No evidence of traumatic listhesis. Skull base and vertebrae: No acute fracture. Soft tissues and spinal canal: No prevertebral fluid or swelling. No visible canal hematoma. Disc levels: Mild multilevel spondylosis. No significant  spinal canal narrowing. Upper chest: No acute abnormality. Other: None. IMPRESSION: 1. No acute intracranial abnormality. Right forehead hematoma. No calvarial fracture. 2. No acute facial fracture. 3. No acute fracture in the cervical spine. Level 1 trauma results were called by telephone at the time of interpretation on 04/11/2023 at 11:52 pm to provider [facility] , who verbally acknowledged these  results. Electronically Signed   By: Minerva Fester M.D.   On: 04/11/2023 23:52   CT Cervical Spine Wo Contrast Result Date: 04/11/2023 CLINICAL DATA:  Level 1 fall.  On blood thinners. EXAM: CT HEAD WITHOUT CONTRAST CT MAXILLOFACIAL WITHOUT CONTRAST CT CERVICAL SPINE WITHOUT CONTRAST TECHNIQUE: Multidetector CT imaging of the head, cervical spine, and maxillofacial structures were performed using the standard protocol without intravenous contrast. Multiplanar CT image reconstructions of the cervical spine and maxillofacial structures were also generated. RADIATION DOSE REDUCTION: This exam was performed according to the departmental dose-optimization program which includes automated exposure control, adjustment of the mA and/or kV according to patient size and/or use of iterative reconstruction technique. COMPARISON:  CT head 03/03/2022 and CT cervical spine 11/04/2021 FINDINGS: CT HEAD FINDINGS Brain: No evidence of acute infarction, hemorrhage, hydrocephalus, extra-axial collection or mass lesion/mass effect. Vascular: No hyperdense vessel or unexpected calcification. Skull: Normal. Negative for fracture or focal lesion. Right forehead hematoma. Other: None. CT MAXILLOFACIAL FINDINGS Osseous: No fracture or mandibular dislocation. No destructive process. Orbits: Negative. No traumatic or inflammatory finding. Sinuses: Mild mucosal thickening in the ethmoid air cells and maxillary sinuses. No acute abnormality. Soft tissues: Negative. CT CERVICAL SPINE FINDINGS Alignment: No evidence of traumatic listhesis. Skull base and vertebrae: No acute fracture. Soft tissues and spinal canal: No prevertebral fluid or swelling. No visible canal hematoma. Disc levels: Mild multilevel spondylosis. No significant spinal canal narrowing. Upper chest: No acute abnormality. Other: None. IMPRESSION: 1. No acute intracranial abnormality. Right forehead hematoma. No calvarial fracture. 2. No acute facial fracture. 3. No acute  fracture in the cervical spine. Level 1 trauma results were called by telephone at the time of interpretation on 04/11/2023 at 11:52 pm to provider [facility] , who verbally acknowledged these results. Electronically Signed   By: Minerva Fester M.D.   On: 04/11/2023 23:52   CT Maxillofacial Wo Contrast Result Date: 04/11/2023 CLINICAL DATA:  Level 1 fall.  On blood thinners. EXAM: CT HEAD WITHOUT CONTRAST CT MAXILLOFACIAL WITHOUT CONTRAST CT CERVICAL SPINE WITHOUT CONTRAST TECHNIQUE: Multidetector CT imaging of the head, cervical spine, and maxillofacial structures were performed using the standard protocol without intravenous contrast. Multiplanar CT image reconstructions of the cervical spine and maxillofacial structures were also generated. RADIATION DOSE REDUCTION: This exam was performed according to the departmental dose-optimization program which includes automated exposure control, adjustment of the mA and/or kV according to patient size and/or use of iterative reconstruction technique. COMPARISON:  CT head 03/03/2022 and CT cervical spine 11/04/2021 FINDINGS: CT HEAD FINDINGS Brain: No evidence of acute infarction, hemorrhage, hydrocephalus, extra-axial collection or mass lesion/mass effect. Vascular: No hyperdense vessel or unexpected calcification. Skull: Normal. Negative for fracture or focal lesion. Right forehead hematoma. Other: None. CT MAXILLOFACIAL FINDINGS Osseous: No fracture or mandibular dislocation. No destructive process. Orbits: Negative. No traumatic or inflammatory finding. Sinuses: Mild mucosal thickening in the ethmoid air cells and maxillary sinuses. No acute abnormality. Soft tissues: Negative. CT CERVICAL SPINE FINDINGS Alignment: No evidence of traumatic listhesis. Skull base and vertebrae: No acute fracture. Soft tissues and spinal canal: No prevertebral fluid or swelling. No visible canal hematoma.  Disc levels: Mild multilevel spondylosis. No significant spinal canal  narrowing. Upper chest: No acute abnormality. Other: None. IMPRESSION: 1. No acute intracranial abnormality. Right forehead hematoma. No calvarial fracture. 2. No acute facial fracture. 3. No acute fracture in the cervical spine. Level 1 trauma results were called by telephone at the time of interpretation on 04/11/2023 at 11:52 pm to provider [facility] , who verbally acknowledged these results. Electronically Signed   By: Minerva Fester M.D.   On: 04/11/2023 23:52   DG Chest Port 1 View Result Date: 04/11/2023 CLINICAL DATA:  Larey Seat, intoxicated EXAM: PORTABLE CHEST 1 VIEW COMPARISON:  03/03/2022 FINDINGS: Single frontal view of the chest demonstrates stable postsurgical changes from median sternotomy and aortic valve replacement. The cardiac silhouette is unremarkable. No acute airspace disease, effusion, or pneumothorax. No acute bony abnormalities. IMPRESSION: 1. No acute intrathoracic process. Electronically Signed   By: Sharlet Salina M.D.   On: 04/11/2023 23:48    Review of Systems  Constitutional:  Negative for chills and fever.  HENT:  Negative for ear discharge, hearing loss and sore throat.   Eyes:  Negative for discharge.  Respiratory:  Negative for cough and shortness of breath.   Cardiovascular:  Negative for chest pain and leg swelling.  Gastrointestinal:  Negative for abdominal pain, constipation, diarrhea, nausea and vomiting.  Musculoskeletal:  Negative for myalgias and neck pain.  Skin:  Negative for rash.  Allergic/Immunologic: Negative for environmental allergies.  Neurological:  Negative for dizziness and seizures.  Hematological:  Does not bruise/bleed easily.  Psychiatric/Behavioral:  Negative for suicidal ideas.   All other systems reviewed and are negative.  Blood pressure (!) 73/51, pulse 69, temperature (!) 97.4 F (36.3 C), temperature source Oral, resp. rate 15, height 6\' 1"  (1.854 m), weight 102.1 kg, SpO2 96%. Physical Exam Vitals reviewed.   Constitutional:      General: He is not in acute distress.    Appearance: Normal appearance. He is well-developed. He is not diaphoretic.     Interventions: Cervical collar and nasal cannula in place.     Comments: Conversant No acute distress  HENT:     Head: Normocephalic. No raccoon eyes, Battle's sign, abrasion, contusion or laceration.      Comments: 2cm lac    Right Ear: Hearing, tympanic membrane, ear canal and external ear normal. No laceration, drainage or tenderness. No foreign body. No hemotympanum. Tympanic membrane is not perforated.     Left Ear: Hearing, tympanic membrane, ear canal and external ear normal. No laceration, drainage or tenderness. No foreign body. No hemotympanum. Tympanic membrane is not perforated.     Nose: Nose normal. No nasal deformity or laceration.     Mouth/Throat:     Mouth: No lacerations.     Pharynx: Uvula midline.  Eyes:     General: Lids are normal. No scleral icterus.    Conjunctiva/sclera: Conjunctivae normal.     Pupils: Pupils are equal, round, and reactive to light.     Comments: Pupils are equal round and reactive No lid lag Moist conjunctiva  Neck:     Thyroid: No thyromegaly.     Vascular: No carotid bruit or JVD.     Trachea: Trachea normal. No tracheal tenderness.     Comments: No cervical lymphadenopathy Cardiovascular:     Rate and Rhythm: Normal rate and regular rhythm.     Pulses: Normal pulses.     Heart sounds: Normal heart sounds. No murmur heard. Pulmonary:     Effort:  Pulmonary effort is normal. No respiratory distress.     Breath sounds: Normal breath sounds. No wheezing or rales.  Chest:     Chest wall: No tenderness.  Abdominal:     General: There is no distension.     Palpations: Abdomen is soft. Abdomen is not rigid.     Tenderness: There is no abdominal tenderness. There is no guarding or rebound.     Hernia: No hernia is present.  Musculoskeletal:        General: No tenderness. Normal range of motion.      Cervical back: Normal range of motion and neck supple. No spinous process tenderness or muscular tenderness.  Lymphadenopathy:     Cervical: No cervical adenopathy.  Skin:    General: Skin is warm and dry.     Findings: No rash.     Nails: There is no clubbing.     Comments: Normal skin turgor  Neurological:     Mental Status: He is alert and oriented to person, place, and time.     GCS: GCS eye subscore is 4. GCS verbal subscore is 5. GCS motor subscore is 6.     Cranial Nerves: No cranial nerve deficit.     Sensory: No sensory deficit.     Comments: Normal gait and station  Psychiatric:        Mood and Affect: Mood normal.        Speech: Speech normal.        Behavior: Behavior normal. Behavior is cooperative.        Thought Content: Thought content normal.        Judgment: Judgment normal.     Comments: Appropriate affect     Assessment/Plan: 54M s/p Fall Right forehead laceration  1.  No intracranial bleed. 2.  Recommend local wound care per EDP. 3.  DC per EDP.    Axel Filler 04/11/2023, 11:55 PM
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
## 2023-04-11 NOTE — ED Notes (Signed)
 Patient transported to CT with Alycia Rossetti RN and Dr. Sharlene Motts with cardiac monitoring

## 2023-04-11 NOTE — ED Notes (Signed)
 Pt return from CT scanner with Alycia Rossetti RN, alert, NAD noted, SBP still 70s

## 2023-04-11 NOTE — ED Notes (Signed)
 Trauma Response Nurse Documentation   Harlan Ervine is a 53 y.o. male arriving to [facility] ED via [facility]  On Eliquis (apixaban) daily. Trauma was activated as a Level 1 by Abram Sander based on the following trauma criteria Anytime Systolic Blood Pressure < 90.  Patient cleared for CT by Dr. Derrell Lolling. Pt transported to CT with trauma response nurse present to monitor. RN remained with the patient throughout their absence from the department for clinical observation.   GCS 15.  Trauma MD Arrival Time: .  History   No past medical history on file.        Initial Focused Assessment (If applicable, or please see trauma documentation): Airway-- intact, no visible obstruction Breathing-- spontaneous, unlabored Circulation-- laceration above right eye, bleeding controlled on arrival  CT's Completed:   CT Head, CT Maxillofacial, and CT C-Spine   Interventions:  See event summary  Plan for disposition:  Discharge home   Consults completed:  none at [phone].  Event Summary: Patient brought in by [facility]. Patient with a fall this evening, ETOH on board. Patient hypotensive in 13s with EMS. On arrival, patient transferred from EMS stretcher to hospital stretcher. GCS 15, A&Ox4. Manual BP obtained, 18 G PIV LFA established. Trauma labs obtained. 2 L LR bolus initiated. Xray chest completed. Patient to CT with TRN and Trauma MD. CT head, c-spine, maxillofacial completed. Patient back to exam room at this time. Patient endorses taking his BP medication this evening which he does not normally do.  MTP Summary (If applicable):  N/A  Bedside handoff with ED RN Mandie.    Leota Sauers  Trauma Response RN  Please call TRN at [phone] for further assistance.

## 2023-04-12 ENCOUNTER — Encounter: Payer: Self-pay | Admitting: Urology

## 2023-04-12 ENCOUNTER — Emergency Department (HOSPITAL_COMMUNITY)

## 2023-04-12 LAB — COMPREHENSIVE METABOLIC PANEL
ALT: 81 U/L — ABNORMAL HIGH (ref 0–44)
AST: 166 U/L — ABNORMAL HIGH (ref 15–41)
Albumin: 4 g/dL (ref 3.5–5.0)
Alkaline Phosphatase: 49 U/L (ref 38–126)
Anion gap: 23 — ABNORMAL HIGH (ref 5–15)
BUN: 10 mg/dL (ref 6–20)
CO2: 17 mmol/L — ABNORMAL LOW (ref 22–32)
Calcium: 7.8 mg/dL — ABNORMAL LOW (ref 8.9–10.3)
Chloride: 96 mmol/L — ABNORMAL LOW (ref 98–111)
Creatinine, Ser: 1.46 mg/dL — ABNORMAL HIGH (ref 0.61–1.24)
GFR, Estimated: 58 mL/min — ABNORMAL LOW (ref >60)
Glucose, Bld: 101 mg/dL — ABNORMAL HIGH (ref 70–99)
Potassium: 3.3 mmol/L — ABNORMAL LOW (ref 3.5–5.1)
Sodium: 136 mmol/L (ref 135–145)
Total Bilirubin: 0.9 mg/dL (ref 0.0–1.2)
Total Protein: 7.2 g/dL (ref 6.5–8.1)

## 2023-04-12 LAB — CBC
HCT: 45 % (ref 39.0–52.0)
Hemoglobin: 15.4 g/dL (ref 13.0–17.0)
MCH: 30.2 pg (ref 26.0–34.0)
MCHC: 34.2 g/dL (ref 30.0–36.0)
MCV: 88.2 fL (ref 80.0–100.0)
Platelets: 148 10*3/uL — ABNORMAL LOW (ref 150–400)
RBC: 5.1 MIL/uL (ref 4.22–5.81)
RDW: 12.1 % (ref 11.5–15.5)
WBC: 7.1 10*3/uL (ref 4.0–10.5)
nRBC: 0 % (ref 0.0–0.2)

## 2023-04-12 LAB — URINALYSIS, ROUTINE W REFLEX MICROSCOPIC
Bacteria, UA: NONE SEEN
Bilirubin Urine: NEGATIVE
Glucose, UA: NEGATIVE mg/dL
Hgb urine dipstick: NEGATIVE
Ketones, ur: 5 mg/dL — AB
Leukocytes,Ua: NEGATIVE
Nitrite: NEGATIVE
Protein, ur: NEGATIVE mg/dL
Specific Gravity, Urine: 1.008 (ref 1.005–1.030)
pH: 5 (ref 5.0–8.0)

## 2023-04-12 LAB — PROTIME-INR
INR: 1.2 (ref 0.8–1.2)
Prothrombin Time: 15 s (ref 11.4–15.2)

## 2023-04-12 LAB — ETHANOL: Alcohol, Ethyl (B): 245 mg/dL — ABNORMAL HIGH (ref <10)

## 2023-04-12 MED ORDER — ACETAMINOPHEN 500 MG PO TABS
1000.0000 mg | ORAL_TABLET | Freq: Once | ORAL | Status: AC
  Administered 2023-04-12: 1000 mg via ORAL
  Filled 2023-04-12: qty 2

## 2023-04-12 MED ORDER — PHENOBARBITAL SODIUM 65 MG/ML IJ SOLN
130.0000 mg | Freq: Once | INTRAMUSCULAR | Status: AC
Start: 2023-04-12 — End: 2023-04-12
  Administered 2023-04-12: 130 mg via INTRAVENOUS
  Filled 2023-04-12: qty 2

## 2023-04-12 MED ORDER — METOCLOPRAMIDE HCL 5 MG/ML IJ SOLN
10.0000 mg | Freq: Once | INTRAMUSCULAR | Status: AC
  Administered 2023-04-12: 10 mg via INTRAVENOUS
  Filled 2023-04-12: qty 2

## 2023-04-12 MED ORDER — LIDOCAINE-EPINEPHRINE (PF) 2 %-1:200000 IJ SOLN
20.0000 mL | Freq: Once | INTRAMUSCULAR | Status: AC
  Administered 2023-04-12: 20 mL via INTRADERMAL
  Filled 2023-04-12: qty 20

## 2023-04-12 MED ORDER — SODIUM CHLORIDE 0.9 % IV BOLUS
500.0000 mL | Freq: Once | INTRAVENOUS | Status: AC
  Administered 2023-04-12: 500 mL via INTRAVENOUS

## 2023-04-12 NOTE — Progress Notes (Signed)
 Orthopedic Tech Progress Note Patient Details:  Brian [name] Oct 17, 1970 [id number]  Patient ID: [name], male   DOB: August 03, 1970, 53 y.o.   MRN: [id number] I attended trauma page. Trinna Post 04/12/2023, 7:10 AM

## 2023-04-12 NOTE — ED Notes (Signed)
 Pt's face was cleaned from dried blood from the laceration above the right eyebrow, bleeding controlled, small bandage applied until provider is ready fir suture repair.

## 2023-04-12 NOTE — ED Notes (Signed)
 Patient transported to X-ray

## 2023-04-12 NOTE — ED Notes (Signed)
 Pt was taken off 3L [place] of supplemental oxygen and placed on room air to see how well he can maintain being on room air as this is pt's baseline.

## 2023-04-12 NOTE — Discharge Instructions (Signed)
Do not let your laceration (cut) get wet for the next 48 hours. After that you may allow soapy water to drain down the wound to clean it.  Please do not scrub.  Do not submerge the wound under water for the next 2 weeks.   To minimize scarring, you can apply a vaseline based ointment for the next 2 weeks and keep it out of direct sun light. After that, you may apply sunscreen for the next several months.   Your stitches will need to be removed in 7-10 days   Return if your wound appears to be infected (see laceration care instructions).

## 2023-04-12 NOTE — ED Notes (Signed)
 Pt was able to get up from stretcher w/o assistance, dress himself and walk around the department with standby assist w/steady gait observed, pt reports he is ready to go home. Provider notified.  Will need assistance [place] if he is not able to call for a ride.

## 2023-04-12 NOTE — ED Provider Notes (Signed)
 [place] EMERGENCY DEPARTMENT AT [facility] Provider Note  CSN: [id number] Arrival date & time: 04/11/23 2322  Chief Complaint(s) Level 1 Fall Pt arrived from home BIB [facility] for fall after heavily drinking tonight, approx 750 mls. Pt fell, on thinners, initial SBP 52, laceration to right eyebrow. EMS reports sats 89, placed on 6L [place], c/o nausea, gave 4 mg zofran. GCS remain 15. Pt arrived Alert, SBP 70s. Bleeding controlled to eyebrow. A&O x4, following commands.   Pt stated he took his BP medications tonight, which he normally does not do.   EMS vitals  NSR 70 HR 82/51 CBG 89 94% 6L [place]  HPI Brian [name] is a 53 y.o. male who presents as a level 1 trauma by EMS.  EMS was called out by the patient's phone.  He had a fall resulting in head trauma.  Patient is on anticoagulation.  He was noted to be hypotensive with initial systolics in the 50s.  Positive EtOH  HPI  Past Medical History Past Medical History:  Diagnosis Date   A-fib [facility])    ADD (attention deficit disorder)    Anxiety and depression    History of ETOH abuse    Hypertension    There are no active problems to display for this patient.  Home Medication(s) Prior to Admission medications   Not on File                                                                                                                                    Allergies Depakote [divalproex sodium] and Penicillins  Review of Systems Review of Systems As noted in HPI  Physical Exam Vital Signs  I have reviewed the triage vital signs BP (!) 97/55   Pulse 91   Temp 97.7 F (36.5 C)   Resp 16   Ht 6\' 1"  (1.854 m)   Wt 102.1 kg   SpO2 95%   BMI 29.69 kg/m   Physical Exam Constitutional:      General: He is not in acute distress.    Appearance: He is well-developed. He is not diaphoretic.  HENT:     Head: Normocephalic. Laceration present.      Right Ear: External ear normal.     Left Ear: External ear  normal.  Eyes:     General: No scleral icterus.       Right eye: No discharge.        Left eye: No discharge.     Conjunctiva/sclera: Conjunctivae normal.     Pupils: Pupils are equal, round, and reactive to light.  Cardiovascular:     Rate and Rhythm: Regular rhythm.     Pulses:          Radial pulses are 2+ on the right side and 2+ on the left side.       Dorsalis pedis pulses are 2+ on the right side and 2+ on the  left side.     Heart sounds: Normal heart sounds. No murmur heard.    No friction rub. No gallop.  Pulmonary:     Effort: Pulmonary effort is normal. No respiratory distress.     Breath sounds: Normal breath sounds. No stridor.  Chest:    Abdominal:     General: There is no distension.     Palpations: Abdomen is soft.     Tenderness: There is no abdominal tenderness.  Musculoskeletal:     Cervical back: Normal range of motion and neck supple. No bony tenderness.     Thoracic back: No bony tenderness.     Lumbar back: No bony tenderness.     Comments: Clavicle stable. Chest stable to AP/Lat compression. Pelvis stable to Lat compression. No obvious extremity deformity. No chest or abdominal wall contusion.  Skin:    General: Skin is warm.  Neurological:     Mental Status: He is alert and oriented to person, place, and time.     GCS: GCS eye subscore is 3. GCS verbal subscore is 5. GCS motor subscore is 6.     Comments: Moving all extremities      ED Results and Treatments Labs (all labs ordered are listed, but only abnormal results are displayed) Labs Reviewed  COMPREHENSIVE METABOLIC PANEL - Abnormal; Notable for the following components:      Result Value   Potassium 3.3 (*)    Chloride 96 (*)    CO2 17 (*)    Glucose, Bld 101 (*)    Creatinine, Ser 1.46 (*)    Calcium 7.8 (*)    AST 166 (*)    ALT 81 (*)    GFR, Estimated 58 (*)    Anion gap 23 (*)    All other components within normal limits  CBC - Abnormal; Notable for the following  components:   Platelets 148 (*)    All other components within normal limits  ETHANOL - Abnormal; Notable for the following components:   Alcohol, Ethyl (B) 245 (*)    All other components within normal limits  URINALYSIS, ROUTINE W REFLEX MICROSCOPIC - Abnormal; Notable for the following components:   APPearance HAZY (*)    Ketones, ur 5 (*)    All other components within normal limits  I-STAT CHEM 8, ED - Abnormal; Notable for the following components:   Potassium 3.4 (*)    Creatinine, Ser 1.70 (*)    Glucose, Bld 103 (*)    Calcium, Ion 0.96 (*)    All other components within normal limits  I-STAT CG4 LACTIC ACID, ED - Abnormal; Notable for the following components:   Lactic Acid, Venous 3.3 (*)    All other components within normal limits  PROTIME-INR  I-STAT CG4 LACTIC ACID, ED  SAMPLE TO BLOOD BANK  EKG  EKG Interpretation Date/Time:  Thursday April 12 2023 00:22:47 EDT Ventricular Rate:  80 PR Interval:  187 QRS Duration:  105 QT Interval:  433 QTC Calculation: 500 R Axis:   44  Text Interpretation: Sinus rhythm Low voltage, precordial leads Nonspecific T abnormalities, diffuse leads Borderline prolonged QT interval No old tracing to compare Confirmed by Drema Pry [phone]) on 04/12/2023 12:56:11 AM       Radiology DG Lumbar Spine Complete Result Date: 04/12/2023 CLINICAL DATA:  Status post fall. EXAM: LUMBAR SPINE - COMPLETE 4+ VIEW COMPARISON:  November 04, 2021 FINDINGS: There is no evidence of an acute lumbar spine fracture. A chronic compression fracture deformity and subsequent vertebroplasty is seen at the level of T12. Additional mild chronic and degenerative changes are seen along the superior endplate of the L4 vertebral body. Alignment is normal. Intervertebral disc spaces are maintained. IMPRESSION: 1. Chronic compression fracture  deformity and subsequent vertebroplasty at the level of T12. 2. No acute lumbar spine fracture. Electronically Signed   By: Aram Candela M.D.   On: 04/12/2023 04:17   CT Head Wo Contrast Result Date: 04/11/2023 CLINICAL DATA:  Level 1 fall.  On blood thinners. EXAM: CT HEAD WITHOUT CONTRAST CT MAXILLOFACIAL WITHOUT CONTRAST CT CERVICAL SPINE WITHOUT CONTRAST TECHNIQUE: Multidetector CT imaging of the head, cervical spine, and maxillofacial structures were performed using the standard protocol without intravenous contrast. Multiplanar CT image reconstructions of the cervical spine and maxillofacial structures were also generated. RADIATION DOSE REDUCTION: This exam was performed according to the departmental dose-optimization program which includes automated exposure control, adjustment of the mA and/or kV according to patient size and/or use of iterative reconstruction technique. COMPARISON:  CT head 03/03/2022 and CT cervical spine 11/04/2021 FINDINGS: CT HEAD FINDINGS Brain: No evidence of acute infarction, hemorrhage, hydrocephalus, extra-axial collection or mass lesion/mass effect. Vascular: No hyperdense vessel or unexpected calcification. Skull: Normal. Negative for fracture or focal lesion. Right forehead hematoma. Other: None. CT MAXILLOFACIAL FINDINGS Osseous: No fracture or mandibular dislocation. No destructive process. Orbits: Negative. No traumatic or inflammatory finding. Sinuses: Mild mucosal thickening in the ethmoid air cells and maxillary sinuses. No acute abnormality. Soft tissues: Negative. CT CERVICAL SPINE FINDINGS Alignment: No evidence of traumatic listhesis. Skull base and vertebrae: No acute fracture. Soft tissues and spinal canal: No prevertebral fluid or swelling. No visible canal hematoma. Disc levels: Mild multilevel spondylosis. No significant spinal canal narrowing. Upper chest: No acute abnormality. Other: None. IMPRESSION: 1. No acute intracranial abnormality. Right forehead  hematoma. No calvarial fracture. 2. No acute facial fracture. 3. No acute fracture in the cervical spine. Level 1 trauma results were called by telephone at the time of interpretation on 04/11/2023 at 11:52 pm to provider [facility] , who verbally acknowledged these results. Electronically Signed   By: Minerva Fester M.D.   On: 04/11/2023 23:52   CT Cervical Spine Wo Contrast Result Date: 04/11/2023 CLINICAL DATA:  Level 1 fall.  On blood thinners. EXAM: CT HEAD WITHOUT CONTRAST CT MAXILLOFACIAL WITHOUT CONTRAST CT CERVICAL SPINE WITHOUT CONTRAST TECHNIQUE: Multidetector CT imaging of the head, cervical spine, and maxillofacial structures were performed using the standard protocol without intravenous contrast. Multiplanar CT image reconstructions of the cervical spine and maxillofacial structures were also generated. RADIATION DOSE REDUCTION: This exam was performed according to the departmental dose-optimization program which includes automated exposure control, adjustment of the mA and/or kV according to patient size and/or use of iterative reconstruction technique. COMPARISON:  CT head 03/03/2022 and CT cervical  spine 11/04/2021 FINDINGS: CT HEAD FINDINGS Brain: No evidence of acute infarction, hemorrhage, hydrocephalus, extra-axial collection or mass lesion/mass effect. Vascular: No hyperdense vessel or unexpected calcification. Skull: Normal. Negative for fracture or focal lesion. Right forehead hematoma. Other: None. CT MAXILLOFACIAL FINDINGS Osseous: No fracture or mandibular dislocation. No destructive process. Orbits: Negative. No traumatic or inflammatory finding. Sinuses: Mild mucosal thickening in the ethmoid air cells and maxillary sinuses. No acute abnormality. Soft tissues: Negative. CT CERVICAL SPINE FINDINGS Alignment: No evidence of traumatic listhesis. Skull base and vertebrae: No acute fracture. Soft tissues and spinal canal: No prevertebral fluid or swelling. No visible canal hematoma.  Disc levels: Mild multilevel spondylosis. No significant spinal canal narrowing. Upper chest: No acute abnormality. Other: None. IMPRESSION: 1. No acute intracranial abnormality. Right forehead hematoma. No calvarial fracture. 2. No acute facial fracture. 3. No acute fracture in the cervical spine. Level 1 trauma results were called by telephone at the time of interpretation on 04/11/2023 at 11:52 pm to provider [facility] , who verbally acknowledged these results. Electronically Signed   By: Minerva Fester M.D.   On: 04/11/2023 23:52   CT Maxillofacial Wo Contrast Result Date: 04/11/2023 CLINICAL DATA:  Level 1 fall.  On blood thinners. EXAM: CT HEAD WITHOUT CONTRAST CT MAXILLOFACIAL WITHOUT CONTRAST CT CERVICAL SPINE WITHOUT CONTRAST TECHNIQUE: Multidetector CT imaging of the head, cervical spine, and maxillofacial structures were performed using the standard protocol without intravenous contrast. Multiplanar CT image reconstructions of the cervical spine and maxillofacial structures were also generated. RADIATION DOSE REDUCTION: This exam was performed according to the departmental dose-optimization program which includes automated exposure control, adjustment of the mA and/or kV according to patient size and/or use of iterative reconstruction technique. COMPARISON:  CT head 03/03/2022 and CT cervical spine 11/04/2021 FINDINGS: CT HEAD FINDINGS Brain: No evidence of acute infarction, hemorrhage, hydrocephalus, extra-axial collection or mass lesion/mass effect. Vascular: No hyperdense vessel or unexpected calcification. Skull: Normal. Negative for fracture or focal lesion. Right forehead hematoma. Other: None. CT MAXILLOFACIAL FINDINGS Osseous: No fracture or mandibular dislocation. No destructive process. Orbits: Negative. No traumatic or inflammatory finding. Sinuses: Mild mucosal thickening in the ethmoid air cells and maxillary sinuses. No acute abnormality. Soft tissues: Negative. CT CERVICAL SPINE  FINDINGS Alignment: No evidence of traumatic listhesis. Skull base and vertebrae: No acute fracture. Soft tissues and spinal canal: No prevertebral fluid or swelling. No visible canal hematoma. Disc levels: Mild multilevel spondylosis. No significant spinal canal narrowing. Upper chest: No acute abnormality. Other: None. IMPRESSION: 1. No acute intracranial abnormality. Right forehead hematoma. No calvarial fracture. 2. No acute facial fracture. 3. No acute fracture in the cervical spine. Level 1 trauma results were called by telephone at the time of interpretation on 04/11/2023 at 11:52 pm to provider [facility] , who verbally acknowledged these results. Electronically Signed   By: Minerva Fester M.D.   On: 04/11/2023 23:52   DG Chest Port 1 View Result Date: 04/11/2023 CLINICAL DATA:  Larey Seat, intoxicated EXAM: PORTABLE CHEST 1 VIEW COMPARISON:  03/03/2022 FINDINGS: Single frontal view of the chest demonstrates stable postsurgical changes from median sternotomy and aortic valve replacement. The cardiac silhouette is unremarkable. No acute airspace disease, effusion, or pneumothorax. No acute bony abnormalities. IMPRESSION: 1. No acute intrathoracic process. Electronically Signed   By: Sharlet Salina M.D.   On: 04/11/2023 23:48    Medications Ordered in ED Medications  lactated ringers bolus 2,000 mL (0 mLs Intravenous Stopped 04/12/23 0047)  sodium chloride 0.9 % bolus 500  mL (0 mLs Intravenous Stopped 04/12/23 0328)  lidocaine-EPINEPHrine (XYLOCAINE W/EPI) 2 %-1:200000 (PF) injection 20 mL (20 mLs Intradermal Given 04/12/23 0301)  acetaminophen (TYLENOL) tablet 1,000 mg (1,000 mg Oral Given 04/12/23 0328)  metoCLOPramide (REGLAN) injection 10 mg (10 mg Intravenous Given 04/12/23 0424)  PHENObarbital (LUMINAL) injection 130 mg (130 mg Intravenous Given 04/12/23 0425)   Procedures .Critical Care  Performed by: Nira Conn, MD Authorized by: Nira Conn, MD   Critical care  provider statement:    Critical care time (minutes):  80   Critical care time was exclusive of:  Separately billable procedures and treating other patients   Critical care was necessary to treat or prevent imminent or life-threatening deterioration of the following conditions:  Trauma and circulatory failure   Critical care was time spent personally by me on the following activities:  Development of treatment plan with patient or surrogate, discussions with consultants, evaluation of patient's response to treatment, examination of patient, obtaining history from patient or surrogate, review of old charts, re-evaluation of patient's condition, pulse oximetry, ordering and review of radiographic studies, ordering and review of laboratory studies and ordering and performing treatments and interventions   (including critical care time) Medical Decision Making / ED Course   Medical Decision Making Amount and/or Complexity of Data Reviewed Labs: ordered. Decision-making details documented in ED Course. Radiology: ordered and independent interpretation performed. Decision-making details documented in ED Course. ECG/medicine tests: ordered and independent interpretation performed. Decision-making details documented in ED Course.  Risk OTC drugs. Prescription drug management. Parenteral controlled substances. Decision regarding hospitalization.     Clinical Course as of 04/12/23 4259  Wed Apr 11, 2023  2335 Level 1 trauma Fall on thinners Airway and breathing intact Patient is hypotensive with systolics in the 70s.  2 L of IV fluid initiated.  Secondary as above concerning only for head injury.  No other injuries noted on exam requiring workup.  Patient's hypotension is felt to be multifactorial, likely distributive from medication use and EtOH.  Less likely from trauma but will obtain workup to ensure. [PC]    Clinical Course User Index [PC] Tanzie Rothschild, Amadeo Garnet, MD   CT head, face  and cervical spine negative for any acute injuries.  X-ray of the chest negative for pneumothorax.  X-ray of the lumbar spine negative for any acute fractures.   CBC without leukocytosis or anemia.  CMP with mild hypokalemia.   Mild renal sufficiency without AKI.  Transaminitis consistent with alcohol use. EtOH of 245.  Patient provided with 2-1/2 L of IV fluids.  Blood pressure responded well.  After monitoring for 7 hours, blood pressures improved with systolics in the 110s.  Patient was allowed to metabolize and began feeling withdrawal symptoms which improved after phenobarbital.  Laceration was thoroughly irrigated and closed by APP.  Patient was able to ambulate.  Final Clinical Impression(s) / ED Diagnoses Final diagnoses:  Fall in home, initial encounter  Facial laceration, initial encounter  Hypotension due to drugs  Alcohol use disorder   The patient appears reasonably screened and/or stabilized for discharge and I doubt any other medical condition or other [facility] requiring further screening, evaluation, or treatment in the ED at this time. I have discussed the findings, Dx and Tx plan with the patient/family who expressed understanding and agree(s) with the plan. Discharge instructions discussed at length. The patient/family was given strict return precautions who verbalized understanding of the instructions. No further questions at time of discharge.  Disposition: Discharge  Condition: Good  ED Discharge Orders     None       Follow Up: Primary care provider  Call  to schedule an appointment for close follow up  [facility] Emergency Department at [facility] [address] [place] [address] [phone]  for suture removal     This chart was dictated using voice recognition software.  Despite best efforts to proofread,  errors can occur which can change the documentation meaning.    Nira Conn, MD 04/12/23 [phone]

## 2023-04-12 NOTE — ED Notes (Signed)
Pt given 2 sprites

## 2023-04-12 NOTE — ED Provider Notes (Signed)
 Physical Exam  BP (!) 94/56   Pulse 92   Temp 97.7 F (36.5 C)   Resp 18   Ht 6\' 1"  (1.854 m)   Wt 102.1 kg   SpO2 94%   BMI 29.69 kg/m   Physical Exam  Procedures  .Laceration Repair  Date/Time: 04/12/2023 6:06 AM  Performed by: Smitty Knudsen, PA-C Authorized by: Smitty Knudsen, PA-C   Consent:    Consent obtained:  Verbal   Consent given by:  Patient   Risks discussed:  Infection and poor cosmetic result Anesthesia:    Anesthesia method:  Local infiltration   Local anesthetic:  Lidocaine 2% WITH epi Laceration details:    Location:  Face   Face location:  R eyebrow   Length (cm):  4   Depth (mm):  2 Exploration:    Limited defect created (wound extended): no     Hemostasis achieved with:  Direct pressure and epinephrine   Imaging outcome: foreign body not noted   Treatment:    Area cleansed with:  Chlorhexidine and saline   Amount of cleaning:  Standard   Irrigation volume:  500cc   Irrigation method:  Pressure wash   Visualized foreign bodies/material removed: no     Debridement:  None Skin repair:    Repair method:  Sutures   Suture size:  5-0   Suture material:  Prolene   Suture technique:  Simple interrupted   Number of sutures:  5 Approximation:    Approximation:  Close Repair type:    Repair type:  Simple Post-procedure details:    Dressing:  Non-adherent dressing   Procedure completion:  Tolerated   ED Course / MDM   Clinical Course as of 04/12/23 0648  Wed Apr 11, 2023  2335 Level 1 trauma Fall on thinners Airway and breathing intact Patient is hypotensive with systolics in the 70s.  2 L of IV fluid initiated.  Secondary as above concerning only for head injury.  No other injuries noted on exam requiring workup.  Patient's hypotension is felt to be multifactorial, likely distributive from medication use and EtOH.  Less likely from trauma but will obtain workup to ensure. [PC]    Clinical Course User Index [PC] [place], Amadeo Garnet, MD   Medical Decision Making Amount and/or Complexity of Data Reviewed Labs: ordered. Radiology: ordered.  Risk OTC drugs. Prescription drug management.   Laceration repair performed for laceration to the right eyebrow. No extension towards eyelid. Patient tolerated repair. Advised on anticipated removal and return precautions.       Smitty Knudsen, PA-C 04/12/23 9562    Nira Conn, MD 04/12/23 [phone]

## 2023-04-15 ENCOUNTER — Observation Stay

## 2023-04-15 ENCOUNTER — Other Ambulatory Visit: Payer: Self-pay

## 2023-04-15 ENCOUNTER — Observation Stay
Admission: EM | Admit: 2023-04-15 | Discharge: 2023-04-18 | Disposition: A | Discharge status: Home / Self Care | Attending: Internal Medicine | Admitting: Internal Medicine

## 2023-04-15 DIAGNOSIS — I959 Hypotension, unspecified: Secondary | ICD-10-CM | POA: Diagnosis not present

## 2023-04-15 DIAGNOSIS — I10 Essential (primary) hypertension: Secondary | ICD-10-CM | POA: Insufficient documentation

## 2023-04-15 DIAGNOSIS — R55 Syncope and collapse: Principal | ICD-10-CM | POA: Insufficient documentation

## 2023-04-15 DIAGNOSIS — E876 Hypokalemia: Secondary | ICD-10-CM | POA: Insufficient documentation

## 2023-04-15 DIAGNOSIS — I480 Paroxysmal atrial fibrillation: Secondary | ICD-10-CM | POA: Insufficient documentation

## 2023-04-15 DIAGNOSIS — K7010 Alcoholic hepatitis without ascites: Secondary | ICD-10-CM | POA: Diagnosis not present

## 2023-04-15 DIAGNOSIS — F10939 Alcohol use, unspecified with withdrawal, unspecified: Secondary | ICD-10-CM | POA: Diagnosis not present

## 2023-04-15 DIAGNOSIS — F10930 Alcohol use, unspecified with withdrawal, uncomplicated: Secondary | ICD-10-CM | POA: Diagnosis not present

## 2023-04-15 DIAGNOSIS — Z7901 Long term (current) use of anticoagulants: Secondary | ICD-10-CM | POA: Insufficient documentation

## 2023-04-15 DIAGNOSIS — Z86711 Personal history of pulmonary embolism: Secondary | ICD-10-CM | POA: Insufficient documentation

## 2023-04-15 DIAGNOSIS — J449 Chronic obstructive pulmonary disease, unspecified: Secondary | ICD-10-CM | POA: Insufficient documentation

## 2023-04-15 DIAGNOSIS — Z79899 Other long term (current) drug therapy: Secondary | ICD-10-CM | POA: Diagnosis not present

## 2023-04-15 DIAGNOSIS — R42 Dizziness and giddiness: Secondary | ICD-10-CM | POA: Diagnosis present

## 2023-04-15 DIAGNOSIS — I4891 Unspecified atrial fibrillation: Secondary | ICD-10-CM | POA: Insufficient documentation

## 2023-04-15 LAB — CBC
HCT: 42 % (ref 39.0–52.0)
Hemoglobin: 14.8 g/dL (ref 13.0–17.0)
MCH: 30.1 pg (ref 26.0–34.0)
MCHC: 35.2 g/dL (ref 30.0–36.0)
MCV: 85.4 fL (ref 80.0–100.0)
Platelets: 149 10*3/uL — ABNORMAL LOW (ref 150–400)
RBC: 4.92 MIL/uL (ref 4.22–5.81)
RDW: 11.9 % (ref 11.5–15.5)
WBC: 6.2 10*3/uL (ref 4.0–10.5)
nRBC: 0 % (ref 0.0–0.2)

## 2023-04-15 LAB — HEPATIC FUNCTION PANEL
ALT: 76 U/L — ABNORMAL HIGH (ref 0–44)
AST: 117 U/L — ABNORMAL HIGH (ref 15–41)
Albumin: 4.3 g/dL (ref 3.5–5.0)
Alkaline Phosphatase: 49 U/L (ref 38–126)
Bilirubin, Direct: 0.5 mg/dL — ABNORMAL HIGH (ref 0.0–0.2)
Indirect Bilirubin: 1.9 mg/dL — ABNORMAL HIGH (ref 0.3–0.9)
Total Bilirubin: 2.4 mg/dL — ABNORMAL HIGH (ref 0.0–1.2)
Total Protein: 7.5 g/dL (ref 6.5–8.1)

## 2023-04-15 LAB — BASIC METABOLIC PANEL
Anion gap: 19 — ABNORMAL HIGH (ref 5–15)
BUN: 13 mg/dL (ref 6–20)
CO2: 21 mmol/L — ABNORMAL LOW (ref 22–32)
Calcium: 7.8 mg/dL — ABNORMAL LOW (ref 8.9–10.3)
Chloride: 92 mmol/L — ABNORMAL LOW (ref 98–111)
Creatinine, Ser: 1.45 mg/dL — ABNORMAL HIGH (ref 0.61–1.24)
GFR, Estimated: 58 mL/min — ABNORMAL LOW (ref >60)
Glucose, Bld: 91 mg/dL (ref 70–99)
Potassium: 3.1 mmol/L — ABNORMAL LOW (ref 3.5–5.1)
Sodium: 132 mmol/L — ABNORMAL LOW (ref 135–145)

## 2023-04-15 LAB — TROPONIN I (HIGH SENSITIVITY)
Troponin I (High Sensitivity): 23 ng/L — ABNORMAL HIGH (ref <18)
Troponin I (High Sensitivity): 18 ng/L — ABNORMAL HIGH (ref <18)

## 2023-04-15 LAB — MAGNESIUM: Magnesium: 1.3 mg/dL — ABNORMAL LOW (ref 1.7–2.4)

## 2023-04-15 LAB — ETHANOL: Alcohol, Ethyl (B): <10 mg/dL (ref <10)

## 2023-04-15 MED ORDER — APIXABAN 2.5 MG PO TABS: 2.5000 mg | ORAL_TABLET | Freq: Two times a day (BID) | ORAL | Status: DC

## 2023-04-15 MED ORDER — APIXABAN 5 MG PO TABS
5.0000 mg | ORAL_TABLET | Freq: Two times a day (BID) | ORAL | Status: DC
  Administered 2023-04-15 – 2023-04-18 (×6): 5 mg via ORAL
  Filled 2023-04-15 (×2): qty 2
  Filled 2023-04-15 (×4): qty 1
  Filled 2023-04-15: qty 2

## 2023-04-15 MED ORDER — BUDESONIDE-FORMOTEROL FUMARATE 80-4.5 MCG/ACT IN AERO: 2.0000 | INHALATION_SPRAY | Freq: Two times a day (BID) | RESPIRATORY_TRACT | Status: DC

## 2023-04-15 MED ORDER — DEXMETHYLPHENIDATE HCL ER 5 MG PO CP24
20.0000 mg | ORAL_CAPSULE | Freq: Every evening | ORAL | Status: DC
  Administered 2023-04-15 – 2023-04-17 (×3): 20 mg via ORAL
  Filled 2023-04-15 (×3): qty 4

## 2023-04-15 MED ORDER — AMIODARONE HCL 200 MG PO TABS
400.0000 mg | ORAL_TABLET | Freq: Two times a day (BID) | ORAL | Status: DC
Start: 2023-04-15 — End: 2023-04-15

## 2023-04-15 MED ORDER — CARVEDILOL 6.25 MG PO TABS
6.2500 mg | ORAL_TABLET | Freq: Two times a day (BID) | ORAL | Status: DC
Start: 2023-04-15 — End: 2023-04-18
  Administered 2023-04-16 – 2023-04-18 (×5): 6.25 mg via ORAL
  Filled 2023-04-15 (×5): qty 1

## 2023-04-15 MED ORDER — POLYETHYLENE GLYCOL 3350 17 G PO PACK
1.0000 | PACK | Freq: Every day | ORAL | Status: DC
  Administered 2023-04-15 – 2023-04-16 (×2): 17 g via ORAL
  Filled 2023-04-15 (×4): qty 1

## 2023-04-15 MED ORDER — ATORVASTATIN CALCIUM 20 MG PO TABS: 20.0000 mg | ORAL_TABLET | Freq: Every day | ORAL | Status: DC

## 2023-04-15 MED ORDER — THIAMINE MONONITRATE 100 MG PO TABS
100.0000 mg | ORAL_TABLET | Freq: Every day | ORAL | Status: DC
  Administered 2023-04-15 – 2023-04-18 (×4): 100 mg via ORAL
  Filled 2023-04-15 (×4): qty 1

## 2023-04-15 MED ORDER — ALBUTEROL SULFATE (2.5 MG/3ML) 0.083% IN NEBU
2.5000 mg | INHALATION_SOLUTION | RESPIRATORY_TRACT | Status: DC | PRN
  Administered 2023-04-15: 2.5 mg via RESPIRATORY_TRACT
  Filled 2023-04-15: qty 3

## 2023-04-15 MED ORDER — SODIUM CHLORIDE 0.9 % IV BOLUS
1000.0000 mL | Freq: Once | INTRAVENOUS | Status: AC
  Administered 2023-04-15: 1000 mL via INTRAVENOUS

## 2023-04-15 MED ORDER — CARVEDILOL 6.25 MG PO TABS: 12.5000 mg | ORAL_TABLET | Freq: Two times a day (BID) | ORAL | Status: DC

## 2023-04-15 MED ORDER — ONDANSETRON HCL 4 MG/2ML IJ SOLN
4.0000 mg | Freq: Once | INTRAMUSCULAR | Status: DC
  Filled 2023-04-15: qty 2

## 2023-04-15 MED ORDER — THIAMINE HCL 100 MG/ML IJ SOLN
100.0000 mg | Freq: Every day | INTRAMUSCULAR | Status: DC
  Filled 2023-04-15: qty 1

## 2023-04-15 MED ORDER — PANTOPRAZOLE SODIUM 40 MG PO TBEC
40.0000 mg | DELAYED_RELEASE_TABLET | Freq: Every day | ORAL | Status: DC
  Administered 2023-04-15 – 2023-04-18 (×4): 40 mg via ORAL
  Filled 2023-04-15 (×4): qty 1

## 2023-04-15 MED ORDER — OXYCODONE-ACETAMINOPHEN 5-325 MG PO TABS
1.0000 | ORAL_TABLET | ORAL | Status: DC | PRN
  Administered 2023-04-15 – 2023-04-17 (×13): 1 via ORAL
  Filled 2023-04-15 (×13): qty 1

## 2023-04-15 MED ORDER — LORAZEPAM 2 MG PO TABS
0.0000 mg | ORAL_TABLET | Freq: Two times a day (BID) | ORAL | Status: DC
  Administered 2023-04-17: 2 mg via ORAL
  Filled 2023-04-15: qty 1

## 2023-04-15 MED ORDER — LORAZEPAM 2 MG/ML IJ SOLN: 0.0000 mg | Freq: Four times a day (QID) | INTRAMUSCULAR | Status: AC

## 2023-04-15 MED ORDER — FENOFIBRATE 54 MG PO TABS
54.0000 mg | ORAL_TABLET | Freq: Every day | ORAL | Status: DC
  Administered 2023-04-15 – 2023-04-16 (×2): 54 mg via ORAL
  Filled 2023-04-15 (×2): qty 1

## 2023-04-15 MED ORDER — LORAZEPAM 2 MG/ML IJ SOLN: 0.0000 mg | Freq: Two times a day (BID) | INTRAMUSCULAR | Status: DC

## 2023-04-15 MED ORDER — POTASSIUM CHLORIDE CRYS ER 20 MEQ PO TBCR
40.0000 meq | EXTENDED_RELEASE_TABLET | ORAL | Status: AC
  Administered 2023-04-15: 40 meq via ORAL
  Filled 2023-04-15: qty 2

## 2023-04-15 MED ORDER — LORAZEPAM 2 MG PO TABS
0.0000 mg | ORAL_TABLET | Freq: Four times a day (QID) | ORAL | Status: AC
  Administered 2023-04-15 (×2): 2 mg via ORAL
  Administered 2023-04-16 – 2023-04-17 (×2): 4 mg via ORAL
  Filled 2023-04-15: qty 1
  Filled 2023-04-15 (×2): qty 2
  Filled 2023-04-15: qty 1

## 2023-04-15 MED ORDER — SERTRALINE HCL 50 MG PO TABS
200.0000 mg | ORAL_TABLET | Freq: Every day | ORAL | Status: DC
  Administered 2023-04-15 – 2023-04-18 (×4): 200 mg via ORAL
  Filled 2023-04-15 (×4): qty 4

## 2023-04-15 MED ORDER — LOSARTAN POTASSIUM 50 MG PO TABS: 50.0000 mg | ORAL_TABLET | Freq: Every day | ORAL | Status: DC

## 2023-04-15 MED ORDER — PANCRELIPASE (LIP-PROT-AMYL) 12000-38000 UNITS PO CPEP
36000.0000 [IU] | ORAL_CAPSULE | Freq: Every day | ORAL | Status: DC
  Administered 2023-04-15 – 2023-04-17 (×3): 36000 [IU] via ORAL
  Filled 2023-04-15: qty 1
  Filled 2023-04-15: qty 3
  Filled 2023-04-15: qty 1
  Filled 2023-04-15: qty 3

## 2023-04-15 MED ORDER — LORAZEPAM 2 MG/ML IJ SOLN
2.0000 mg | Freq: Once | INTRAMUSCULAR | Status: AC
  Administered 2023-04-15: 2 mg via INTRAVENOUS
  Filled 2023-04-15: qty 1

## 2023-04-15 MED ORDER — CLONAZEPAM 1 MG PO TABS
1.0000 mg | ORAL_TABLET | Freq: Three times a day (TID) | ORAL | Status: DC | PRN
  Administered 2023-04-15 – 2023-04-18 (×9): 1 mg via ORAL
  Filled 2023-04-15: qty 1
  Filled 2023-04-15: qty 2
  Filled 2023-04-15 (×2): qty 1
  Filled 2023-04-15: qty 2
  Filled 2023-04-15 (×2): qty 1
  Filled 2023-04-15: qty 2
  Filled 2023-04-15 (×2): qty 1

## 2023-04-15 MED ORDER — ONDANSETRON 4 MG PO TBDP: 8.0000 mg | ORAL_TABLET | Freq: Two times a day (BID) | ORAL | Status: DC

## 2023-04-15 MED ORDER — PANCRELIPASE (LIP-PROT-AMYL) 12000-38000 UNITS PO CPEP
72000.0000 [IU] | ORAL_CAPSULE | Freq: Three times a day (TID) | ORAL | Status: DC
  Administered 2023-04-15 – 2023-04-18 (×9): 72000 [IU] via ORAL
  Filled 2023-04-15: qty 2
  Filled 2023-04-15 (×3): qty 6
  Filled 2023-04-15: qty 2
  Filled 2023-04-15 (×2): qty 6
  Filled 2023-04-15 (×3): qty 2
  Filled 2023-04-15: qty 6

## 2023-04-15 MED ORDER — ONDANSETRON HCL 4 MG/2ML IJ SOLN
4.0000 mg | Freq: Four times a day (QID) | INTRAMUSCULAR | Status: DC | PRN
  Administered 2023-04-15 – 2023-04-17 (×5): 4 mg via INTRAVENOUS
  Filled 2023-04-15 (×5): qty 2

## 2023-04-15 MED ORDER — FUROSEMIDE 20 MG PO TABS
20.0000 mg | ORAL_TABLET | Freq: Every day | ORAL | Status: DC
  Administered 2023-04-16 – 2023-04-18 (×3): 20 mg via ORAL
  Filled 2023-04-15 (×3): qty 1

## 2023-04-15 MED ORDER — OXYCODONE HCL 5 MG PO TABS
5.0000 mg | ORAL_TABLET | Freq: Once | ORAL | Status: AC
  Administered 2023-04-15: 5 mg via ORAL
  Filled 2023-04-15: qty 1

## 2023-04-15 MED ORDER — MOMETASONE FURO-FORMOTEROL FUM 100-5 MCG/ACT IN AERO
2.0000 | INHALATION_SPRAY | Freq: Two times a day (BID) | RESPIRATORY_TRACT | Status: DC
  Administered 2023-04-15 – 2023-04-18 (×6): 2 via RESPIRATORY_TRACT
  Filled 2023-04-15: qty 8.8

## 2023-04-15 MED ORDER — MAGNESIUM SULFATE 2 GM/50ML IV SOLN
2.0000 g | Freq: Once | INTRAVENOUS | Status: AC
  Administered 2023-04-15: 2 g via INTRAVENOUS
  Filled 2023-04-15: qty 50

## 2023-04-15 MED ORDER — TIZANIDINE HCL 2 MG PO TABS
2.0000 mg | ORAL_TABLET | Freq: Three times a day (TID) | ORAL | Status: DC | PRN
  Administered 2023-04-15 – 2023-04-17 (×6): 2 mg via ORAL
  Filled 2023-04-15 (×8): qty 1

## 2023-04-15 NOTE — ED Notes (Signed)
 CCMD and Pt placed on Cardiac monitoring.

## 2023-04-15 NOTE — ED Triage Notes (Addendum)
 Pt to ED by POV from home with c/o Hypotension. Pt states his blood pressure keeps dropping and that he is concerned as he recently seen following an episode where he fell and hit his face. Per previous triage notes pt was highly intoxicated on said occasion. Pt reports BP as low as 75/35, all other VSS, NADN. Pt endorses drinking a pint of vodka and would like help for his alcohol use.

## 2023-04-15 NOTE — ED Notes (Signed)
 Attending notified this RN OK to give Oxycodone-Acetaminophen, as long as kept under max daily dose.

## 2023-04-15 NOTE — H&P (Signed)
 History and Physical    Brian [name] ZOX:[id number] DOB: [birth year] DOA: 04/15/2023  PCP: Elayne Snare, MD (Confirm with patient/family/NH records and if not entered, this has to be entered at [facility] point of entry) Patient coming from: Home  I have personally briefly reviewed patient's old medical records in [facility]  Chief Complaint: Lightheadedness, palpitations, nauseous vomiting  HPI: Brian [name] is [name] 53 y.o. male with medical history significant of alcohol abuse, PAF on Eliquis, HTN, mild intermittent asthma, anxiety/depression, seizure disorder, presented with multiple complaints including palpitations, nauseous vomiting, unsteady gait and frequent falls.  Patient has been drinking and last drink was last night, overnight, patient became lightheaded and associated with strong feeling of palpitations and when he got up this morning he found himself very wobbly, shaky and sustained several falls denies any head injury or neck injury, no LOC.  In 1 event he hit his right eye with [name] laceration of right-sided forehead.  He also complaining about nauseous vomiting of stomach content none bile nonbloody but denied any abdominal pain or diarrhea.  He also complaining about strong feeling of palpitations and wonder " [name]-fib has come back"  ED Course: Afebrile, initially with tachycardia and in [name]-fib but rate controlled and become sinus rhythm again during ED stay.  Blood pressure on the lower side 112/74 O2 saturation 95% on room air.  Blood work showed sodium 132, potassium 3.1, magnesium 1.3, AST 117, ALT 76, bilirubin 2.4  Patient was given Ativan x 1 oxycodone x 1 magnesium x 1 in the ED  Review of Systems: As per HPI otherwise 14 point review of systems negative.    Past Medical History:  Diagnosis Date   [name]-fib [facility])    ADD (attention deficit disorder)    ADHD    Alcohol abuse    Anxiety    Anxiety and depression    Asthma    Congenital heart defect     Depression    History of ETOH abuse    Hyperlipidemia    Hypertension    Pulmonary thromboembolism (HCC)    Seizure (HCC)    Thoracic aortic aneurysm [facility])     Past Surgical History:  Procedure Laterality Date   artificial aorta and pig valve  02/25/2020   CARDIAC CATHETERIZATION     CARDIAC SURGERY     CHOLECYSTECTOMY  2004   COLONOSCOPY     DENTAL SURGERY     IR KYPHO THORACIC WITH BONE BIOPSY  12/13/2021   IR RADIOLOGIST EVAL & MGMT  11/22/2021   LIVER BIOPSY  2004     reports that he has never smoked. He has never used smokeless tobacco. He reports current alcohol use. He reports that he does not currently use drugs.  Allergies  Allergen Reactions   Metoprolol Anaphylaxis    AFFECTED MOTOR SKILLS Tolerated coreg 03/02/20   Lithium Other (See Comments)    Emotional issues admitted after taking   Valproic Acid Other (See Comments)    hallucinations    Depakote [Divalproex Sodium]     psychosis   Penicillins Hives   Depakote [Divalproex Sodium] Anxiety   Nsaids Other (See Comments)    Gastritis Contraindicated due to Eliquis and Creon    Penicillins Rash    Tolerated Ancef February 2022    Sulfa Antibiotics Rash    History reviewed. No pertinent family history.  Prior to Admission medications   Medication Sig Start Date End Date Taking? Authorizing Provider  albuterol (PROVENTIL HFA;VENTOLIN HFA) 108 (  90 Base) MCG/ACT inhaler Inhale 1 puff into the lungs every 4 (four) hours as needed for wheezing or shortness of breath.    [provider]  amiodarone (PACERONE) 400 MG tablet TAKE 1 TABLET(400 MG) BY MOUTH TWICE DAILY 06/22/22   Brian Blackwater A, MD  atorvastatin (LIPITOR) 20 MG tablet Take 20 mg by mouth daily. 11/03/21   [provider]  bacitracin 500 UNIT/GM ointment Apply 1 Application topically 2 (two) times daily. 09/24/22   Cameron Ali, PA-C  Budesonide-Formoterol Fumarate (SYMBICORT IN) Inhale 1 puff into the lungs 2 (two) times  daily.    [provider]  carvedilol (COREG) 12.5 MG tablet Take 12.5 mg by mouth 2 (two) times daily. 12/13/21   [provider]  clarithromycin (BIAXIN) 500 MG tablet Take 500 mg by mouth 2 (two) times daily. 02/10/23   [provider]  clonazePAM (KLONOPIN) 1 MG tablet Take 1 tablet by mouth 3 (three) times daily as needed. 02/16/16   [provider]  ELIQUIS 2.5 MG TABS tablet Take 2.5 mg by mouth 2 (two) times daily. 11/06/21   [provider]  fenofibrate (TRICOR) 145 MG tablet Take 145 mg by mouth daily. 06/10/21   [provider]  FOCALIN XR 20 MG 24 hr capsule Take 20 mg by mouth every evening. 10/04/21   [provider]  FOCALIN XR 30 MG CP24 Take 1 capsule by mouth every morning. 11/04/21   [provider]  furosemide (LASIX) 20 MG tablet Take 20 mg by mouth daily. 11/06/21   [provider]  HYDROcodone-acetaminophen (NORCO/VICODIN) 5-325 MG tablet Take 1 tablet by mouth every 6 (six) hours as needed for severe pain. 09/24/22 09/24/23  Cameron Ali, PA-C  lipase/protease/amylase (CREON) 36000 UNITS CPEP capsule Take 1 capsule by mouth. 2 cap qam and 2 cap at noon , 2 cap qpm and 1 cap qhs = 7 caps [name] day 07/29/20   [provider]  losartan (COZAAR) 50 MG tablet Take 50 mg by mouth daily. 10/24/22   [provider]  losartan (COZAAR) 50 MG tablet Take 50 mg by mouth daily. 01/18/23   [provider]  omeprazole (PRILOSEC) 20 MG capsule Take 20 mg by mouth daily. 11/14/21   [provider]  ondansetron (ZOFRAN-ODT) 8 MG disintegrating tablet Take 8 mg by mouth 2 (two) times daily. 12/13/21   [provider]  oxyCODONE (OXY IR/ROXICODONE) 5 MG immediate release tablet Take 5 mg by mouth 4 (four) times daily as needed. 02/16/23   [provider]  oxyCODONE-acetaminophen (PERCOCET/ROXICET) 5-325 MG tablet Take 1 tablet by mouth every 4 (four) hours as needed.  02/06/23   [provider]  polyethylene glycol (MIRALAX / GLYCOLAX) 17 g packet Take 1 packet by mouth daily. 04/09/22   [provider]  sertraline (ZOLOFT) 100 MG tablet Take 200 mg by mouth daily. 02/16/16   [provider]  SYMBICORT 80-4.5 MCG/ACT inhaler Inhale 2 puffs into the lungs at bedtime as needed. 12/07/21   [provider]  tadalafil (CIALIS) 5 MG tablet Take 1 tablet (5 mg total) by mouth daily as needed for erectile dysfunction. 02/21/23   McGowan, Carollee Herter A, PA-C  tiZANidine (ZANAFLEX) 2 MG tablet TAKE 1 TABLET(2 MG) BY MOUTH EVERY 8 HOURS AS NEEDED FOR MUSCLE SPASMS 12/16/21   Drake Leach, PA-C    Physical Exam: Vitals:   04/15/23 0724 04/15/23 0920 04/15/23 1000 04/15/23 1010  BP:  (!) 141/93 (!) 146/107 Marland Kitchen)  146/107  Pulse:  85 79 93  Resp:   18   Temp: 98.3 F (36.8 C)     TempSrc:      SpO2:   95%     Constitutional: NAD, calm, comfortable Vitals:   04/15/23 0724 04/15/23 0920 04/15/23 1000 04/15/23 1010  BP:  (!) 141/93 (!) 146/107 (!) 146/107  Pulse:  85 79 93  Resp:   18   Temp: 98.3 F (36.8 C)     TempSrc:      SpO2:   95%    Eyes: PERRL, lids and conjunctivae normal ENMT: Mucous membranes are moist. Posterior pharynx clear of any exudate or lesions.Normal dentition.  Neck: normal, supple, no masses, no thyromegaly Respiratory: clear to auscultation bilaterally, no wheezing, no crackles. Normal respiratory effort. No accessory muscle use.  Cardiovascular: Regular rate and rhythm, no murmurs / rubs / gallops. No extremity edema. 2+ pedal pulses. No carotid bruits.  Abdomen: no tenderness, no masses palpated. No hepatosplenomegaly. Bowel sounds positive.  Musculoskeletal: no clubbing / cyanosis. No joint deformity upper and lower extremities. Good ROM, no contractures. Normal muscle tone.  Skin: no rashes, lesions, ulcers. No induration Neurologic: CN 2-12 grossly intact. Sensation intact, DTR normal. Strength 5/5 in all  4.  Fine tremors on bilateral fingertips Psychiatric: Normal judgment and insight. Alert and oriented x 3. Normal mood.     Labs on Admission: I have personally reviewed following labs and imaging studies  CBC: Recent Labs  Lab 04/11/23 2329 04/11/23 2334 04/15/23 0316  WBC  --  7.1 6.2  HGB 16.3 15.4 14.8  HCT 48.0 45.0 42.0  MCV  --  88.2 85.4  PLT  --  148* 149*   Basic Metabolic Panel: Recent Labs  Lab 04/11/23 2329 04/11/23 2334 04/15/23 0316 04/15/23 0514  NA 139 136 132*  --   K 3.4* 3.3* 3.1*  --   CL 99 96* 92*  --   CO2  --  17* 21*  --   GLUCOSE 103* 101* 91  --   BUN 11 10 13   --   CREATININE 1.70* 1.46* 1.45*  --   CALCIUM  --  7.8* 7.8*  --   MG  --   --   --  1.3*   GFR: Estimated Creatinine Clearance: 74.9 mL/min ([name]) (by C-G formula based on SCr of 1.45 mg/dL (H)). Liver Function Tests: Recent Labs  Lab 04/11/23 2334 04/15/23 0514  AST 166* 117*  ALT 81* 76*  ALKPHOS 49 49  BILITOT 0.9 2.4*  PROT 7.2 7.5  ALBUMIN 4.0 4.3   No results for input(s): "LIPASE", "AMYLASE" in the last 168 hours. No results for input(s): "AMMONIA" in the last 168 hours. Coagulation Profile: Recent Labs  Lab 04/11/23 2334  INR 1.2   Cardiac Enzymes: No results for input(s): "CKTOTAL", "CKMB", "CKMBINDEX", "TROPONINI" in the last 168 hours. BNP (last 3 results) No results for input(s): "PROBNP" in the last 8760 hours. HbA1C: No results for input(s): "HGBA1C" in the last 72 hours. CBG: No results for input(s): "GLUCAP" in the last 168 hours. Lipid Profile: No results for input(s): "CHOL", "HDL", "LDLCALC", "TRIG", "CHOLHDL", "LDLDIRECT" in the last 72 hours. Thyroid Function Tests: No results for input(s): "TSH", "T4TOTAL", "FREET4", "T3FREE", "THYROIDAB" in the last 72 hours. Anemia Panel: No results for input(s): "VITAMINB12", "FOLATE", "FERRITIN", "TIBC", "IRON", "RETICCTPCT" in the last 72 hours. Urine analysis:    Component Value Date/Time    COLORURINE YELLOW 04/12/2023 0558   APPEARANCEUR  HAZY ([name]) 04/12/2023 0558   APPEARANCEUR Hazy ([name]) 03/02/2022 1412   LABSPEC 1.008 04/12/2023 0558   PHURINE 5.0 04/12/2023 0558   GLUCOSEU NEGATIVE 04/12/2023 0558   HGBUR NEGATIVE 04/12/2023 0558   BILIRUBINUR NEGATIVE 04/12/2023 0558   BILIRUBINUR Negative 03/02/2022 1412   KETONESUR 5 ([name]) 04/12/2023 0558   PROTEINUR NEGATIVE 04/12/2023 0558   NITRITE NEGATIVE 04/12/2023 0558   LEUKOCYTESUR NEGATIVE 04/12/2023 0558    Radiological Exams on Admission: No results found.  EKG: Independently reviewed.  Sinus tachycardia, no acute ST changes.  QTc 513  Assessment/Plan Principal Problem:   Alcohol withdrawal (HCC) Active Problems:   Alcoholic hepatitis without ascites   [name]-fib (HCC)  (please populate well all problems here in Problem List. (For example, if patient is on BP meds at home and you resume or decide to hold them, it is [name] problem that needs to be her. Same for CAD, COPD, HLD and so on)  Alcohol withdrawal Acute ambulation impairment -Continue CIWA protocol with as needed benzos  Fall and dull trauma on right forehead -Given that the patient is on Eliquis, will check CT head to rule out any intracranial bleed  PAF with RVR -Back to normal sinus rhythm without intervention.  Likely related to alcohol withdrawal symptoms and extensive electrolyte imbalance including hypokalemia and hypomagnesemia -Replenish K and magnesium -Continue amiodarone and Coreg -Continue Eliquis after CT head scan  Hypokalemia -P.o. replacement  Hypomagnesemia -IV replacement, recheck level tomorrow  HTN -Decrease Coreg dosage to 6.25 twice daily -Hold olmesartan  Asthma/COPD -No acute concern  DVT prophylaxis: Eliquis Code Status: Full code Family Communication: None at bedside Disposition Plan: Expect less than 2 midnight hospital stay Consults called: None Admission status: Telemetry observation   Emeline General MD [facility] Pager [phone]  04/15/2023, 11:36 AM

## 2023-04-15 NOTE — ED Notes (Signed)
 PT to CT.

## 2023-04-15 NOTE — ED Provider Notes (Signed)
 [facility] Provider Note    Event Date/Time   First MD Initiated Contact with Patient 04/15/23 [phone]     (approximate)   History   Hypotension   HPI  Brian [name] is a 53 y.o. male with a history of atrial fibrillation on Eliquis, hypertension, and alcohol abuse who presents with dizziness, low blood pressure, and recurrent syncopal and near syncopal episodes which have been occurring for some time but have worsened over the last week.  The patient was seen at the [facility] ED a few days ago for head injury after a fall like this.  The patient states he is concerned he is on too much blood pressure medication.  The dizziness and falls also appear to be related to his heavy alcohol use; he states that he drinks approximately a pint of liquor per day, sometimes more, and starts drinking early in the day due to nausea and withdrawal symptoms.  The patient states that he wants help with detox.  He denies any chest pain, palpitations, shortness of breath, diarrhea, or fever.  He endorses alcohol use last night.  I reviewed the past medical records.  The patient was seen at the [facility], ED on 3/20 for a fall with head trauma and hypotension with systolic BP as low as the 50s initially.  He had CTs of the head, facial bones, and cervical spine which were negative, had a laceration repair on his right forehead, received fluids as well as phenobarbital for alcohol withdrawal.   Physical Exam   Triage Vital Signs: ED Triage Vitals  Encounter Vitals Group     BP 04/15/23 0308 112/72     Systolic BP Percentile --      Diastolic BP Percentile --      Pulse Rate 04/15/23 0308 (!) 103     Resp 04/15/23 0308 19     Temp 04/15/23 0308 98 F (36.7 C)     Temp Source 04/15/23 0308 Oral     SpO2 04/15/23 0308 94 %     Weight --      Height --      Head Circumference --      Peak Flow --      Pain Score 04/15/23 0310 5     Pain Loc --      Pain Education --       Exclude from Growth Chart --     Most recent vital signs: Vitals:   04/15/23 0724 04/15/23 0920  BP:  (!) 141/93  Pulse:  85  Resp:    Temp: 98.3 F (36.8 C)   SpO2:       General: Awake, no distress.  CV:  Good peripheral perfusion.  Resp:  Normal effort.  Abd:  No distention.  Other:  Healing laceration right forehead.  Right periorbital ecchymosis.  EOMI.  PERRLA.  No photophobia.  Tongue fasciculation present.  No asterixis.   ED Results / Procedures / Treatments   Labs (all labs ordered are listed, but only abnormal results are displayed) Labs Reviewed  BASIC METABOLIC PANEL - Abnormal; Notable for the following components:      Result Value   Sodium 132 (*)    Potassium 3.1 (*)    Chloride 92 (*)    CO2 21 (*)    Creatinine, Ser 1.45 (*)    Calcium 7.8 (*)    GFR, Estimated 58 (*)    Anion gap 19 (*)    All other  components within normal limits  CBC - Abnormal; Notable for the following components:   Platelets 149 (*)    All other components within normal limits  MAGNESIUM - Abnormal; Notable for the following components:   Magnesium 1.3 (*)    All other components within normal limits  HEPATIC FUNCTION PANEL - Abnormal; Notable for the following components:   AST 117 (*)    ALT 76 (*)    Total Bilirubin 2.4 (*)    Bilirubin, Direct 0.5 (*)    Indirect Bilirubin 1.9 (*)    All other components within normal limits  TROPONIN I (HIGH SENSITIVITY) - Abnormal; Notable for the following components:   Troponin I (High Sensitivity) 23 (*)    All other components within normal limits  TROPONIN I (HIGH SENSITIVITY) - Abnormal; Notable for the following components:   Troponin I (High Sensitivity) 18 (*)    All other components within normal limits  ETHANOL     EKG  ED ECG REPORT I, Dionne Bucy, the attending physician, personally viewed and interpreted this ECG.  Date: 04/15/2023 EKG Time: 0319 Rate: 97 Rhythm: normal sinus rhythm QRS Axis:  normal Intervals: Prolonged QTc ST/T Wave abnormalities: normal Narrative Interpretation: no evidence of acute ischemia    RADIOLOGY    PROCEDURES:  Critical Care performed: No  Procedures   MEDICATIONS ORDERED IN ED: Medications  sodium chloride 0.9 % bolus 1,000 mL (0 mLs Intravenous Stopped 04/15/23 0920)  LORazepam (ATIVAN) injection 2 mg (2 mg Intravenous Given 04/15/23 0747)  magnesium sulfate IVPB 2 g 50 mL (0 g Intravenous Stopped 04/15/23 0943)  oxyCODONE (Oxy IR/ROXICODONE) immediate release tablet 5 mg (5 mg Oral Given 04/15/23 0841)     IMPRESSION / MDM / ASSESSMENT AND PLAN / ED COURSE  I reviewed the triage vital signs and the nursing notes.  53 year old male with PMH as noted above presents with recurrent dizziness, near syncope, syncope, and falls related to alcohol abuse.  The patient has atrial fibrillation and hypertension and is on antihypertensives as well.  On exam currently the vital signs are normal.  Physical exam is significant for tongue fasciculation.  Neurologic exam is nonfocal.  Differential diagnosis includes, but is not limited to, dehydration/hypovolemia, paroxysmal atrial fibrillation, other cardiac dysrhythmia, electrolyte abnormality, other metabolic disturbance, alcohol intoxication, alcohol withdrawal.  Initial lab workup is significant for borderline elevated troponin and elevated anion gap.  We will give fluids, Ativan, obtain additional lab workup, and reassess.  Patient's presentation is most consistent with acute presentation with potential threat to life or bodily function.  The patient is on the cardiac monitor to evaluate for evidence of arrhythmia and/or significant heart rate changes.   ----------------------------------------- 9:58 AM on 04/15/2023 -----------------------------------------  Additional lab workup is significant for mildly elevated bilirubin.  Magnesium is low.  Repeat troponin is not significantly changed.  I have  ordered IV magnesium.  The patient's vital signs remained stable.  Based on the patient's presentation and discussion with him, I think he requires inpatient admission for further management of the withdrawal symptoms as well as observation and workup for the recurrent syncopal events given that he is at high risk of recurrent syncope, falls, additional injuries if he has not stabilized.  I do not feel that he would be appropriate for discharge with outpatient detox referral and a Librium taper.  The patient agrees with this plan.  I consulted Dr. Chipper Herb from the hospitalist service; based on our discussion he agrees to evaluate the patient  for admission  FINAL CLINICAL IMPRESSION(S) / ED DIAGNOSES   Final diagnoses:  Syncope, unspecified syncope type  Alcohol withdrawal syndrome with complication (HCC)  Hypomagnesemia     Rx / DC Orders   ED Discharge Orders     None        Note:  This document was prepared using Dragon voice recognition software and may include unintentional dictation errors.    Dionne Bucy, MD 04/15/23 1000

## 2023-04-15 NOTE — ED Notes (Signed)
 Pt states he is in Pain this RN messaged Attending to see if safe to give percocet to Pt d/t liver damage/panel.

## 2023-04-15 NOTE — ED Notes (Signed)
 Fall precautions in place as Pt stated he fell yesterday.

## 2023-04-15 NOTE — ED Notes (Signed)
 Pt up to rest room pt ambulates in upright steady unassisted gait pt speaking in full clear sentences pt provided ice for soda pt request medication

## 2023-04-15 NOTE — ED Notes (Signed)
 Pt ambulatory to front desk asking about wait times, states he is feeling weaker, informed pt I couldn't give him a time but that there were other pts waiting longer than he has been.  Pt ambulatory without difficulty.

## 2023-04-16 DIAGNOSIS — Z952 Presence of prosthetic heart valve: Secondary | ICD-10-CM | POA: Diagnosis not present

## 2023-04-16 DIAGNOSIS — I480 Paroxysmal atrial fibrillation: Secondary | ICD-10-CM | POA: Diagnosis not present

## 2023-04-16 DIAGNOSIS — K7010 Alcoholic hepatitis without ascites: Secondary | ICD-10-CM | POA: Diagnosis not present

## 2023-04-16 DIAGNOSIS — F10930 Alcohol use, unspecified with withdrawal, uncomplicated: Secondary | ICD-10-CM | POA: Diagnosis not present

## 2023-04-16 DIAGNOSIS — R296 Repeated falls: Secondary | ICD-10-CM

## 2023-04-16 LAB — BASIC METABOLIC PANEL
Anion gap: 16 — ABNORMAL HIGH (ref 5–15)
BUN: 18 mg/dL (ref 6–20)
CO2: 26 mmol/L (ref 22–32)
Calcium: 7.5 mg/dL — ABNORMAL LOW (ref 8.9–10.3)
Chloride: 97 mmol/L — ABNORMAL LOW (ref 98–111)
Creatinine, Ser: 0.99 mg/dL (ref 0.61–1.24)
GFR, Estimated: >60 mL/min (ref >60)
Glucose, Bld: 90 mg/dL (ref 70–99)
Potassium: 3.2 mmol/L — ABNORMAL LOW (ref 3.5–5.1)
Sodium: 138 mmol/L (ref 135–145)

## 2023-04-16 LAB — MAGNESIUM: Magnesium: 1.4 mg/dL — ABNORMAL LOW (ref 1.7–2.4)

## 2023-04-16 LAB — HIV ANTIBODY (ROUTINE TESTING W REFLEX): HIV Screen 4th Generation wRfx: NONREACTIVE

## 2023-04-16 MED ORDER — ADULT MULTIVITAMIN W/MINERALS CH
1.0000 | ORAL_TABLET | Freq: Every day | ORAL | Status: DC
  Administered 2023-04-16 – 2023-04-18 (×3): 1 via ORAL
  Filled 2023-04-16 (×3): qty 1

## 2023-04-16 MED ORDER — FENOFIBRATE 160 MG PO TABS
160.0000 mg | ORAL_TABLET | Freq: Every day | ORAL | Status: DC
  Administered 2023-04-17 – 2023-04-18 (×2): 160 mg via ORAL
  Filled 2023-04-16 (×2): qty 1

## 2023-04-16 MED ORDER — MAGNESIUM OXIDE -MG SUPPLEMENT 400 (240 MG) MG PO TABS
400.0000 mg | ORAL_TABLET | Freq: Two times a day (BID) | ORAL | Status: DC
  Administered 2023-04-16 – 2023-04-18 (×5): 400 mg via ORAL
  Filled 2023-04-16 (×6): qty 1

## 2023-04-16 MED ORDER — POTASSIUM CHLORIDE 20 MEQ PO PACK
40.0000 meq | PACK | Freq: Every day | ORAL | Status: DC
  Administered 2023-04-16: 40 meq via ORAL
  Filled 2023-04-16 (×2): qty 2

## 2023-04-16 MED ORDER — FOLIC ACID 1 MG PO TABS
1.0000 mg | ORAL_TABLET | Freq: Every day | ORAL | Status: DC
  Administered 2023-04-16 – 2023-04-18 (×3): 1 mg via ORAL
  Filled 2023-04-16 (×3): qty 1

## 2023-04-16 NOTE — ED Notes (Signed)
 Report received from off going Nurse. Pt A&O x4. Resting quietly in bed. Rise and fall of chest present. No distress present. Bed low and locked, call light in reach.

## 2023-04-16 NOTE — Plan of Care (Signed)
 Pt states he is afraid of dying here. When asked what does he mean he stated he feels as if he is physically dying. Nurse reassured Patient he was stable and in a safe place. Notified Hospitalist Mansy, requested telesitter and a psych consult.

## 2023-04-16 NOTE — Consult Note (Signed)
 [facility] CLINIC CARDIOLOGY CONSULT NOTE       Patient ID: Brian [name] MRN: [id number] DOB/AGE: 53-Jul-1972 53 y.o.  Admit date: 04/15/2023 Referring Physician Dr. Clide [name] Primary Physician Brian [name], Brian Bright, Brian [name]  Primary Cardiologist Dr. Allena [name] [facility]) Reason for Consultation atrial fibrillation, syncope  HPI: Brian [name] is a 53 y.o. [name]  with a past medical history of ascending aortic aneurysm s/p aortic root repair and bioprosthetic AVR 02/2020, paroxysmal atrial fibrillation, asthma, anxiety/depression, seizure disorder who presented to the ED on 04/15/2023 for dizziness, frequent falls. Cardiology was consulted for further evaluation.   Difficult historian overall. Patient states he's been having frequent falls and dizziness. Patient was recently seen at [facility] ED on 03/20 for a head injury due to a fall. Given recent fall and persistent dizziness since this time, decided to come to ED for evaluation. Work up in the ED notable for BP 112/72, HR 103, afebrile, SpO2 94% on room air. Labs showed Na 132, K 3.1, Mg 1.3, Cr 1.45, AST 117, ALT 76, Hgb 14.8, Platelets 149. Troponins minimally elevated and flat (23 > 18). EKG sinus rhythm at 66 bpm. Has had low BP readings intermittently.  Upon my evaluation, patient is laying in hospital bed with no acute distress. Patient reports having intermittent dizziness for the past week. States when he stands up his blood pressure drops, reports recording a BP at home "73/34". Patient lives alone. Patient denies SOB or chest pain.  Has a face laceration with stitches and bruising to his R eye noted from recent fall. States that he does not know if he actually lost consciousness, but that he "just fell down". Reports occasional palpitations. States he has had 4 falls in the last year.   Review of systems complete and found to be negative unless listed above    Past Medical History:  Diagnosis Date   A-fib (HCC)    ADD (attention deficit  disorder)    ADHD    Alcohol abuse    Anxiety    Anxiety and depression    Asthma    Congenital heart defect    Depression    History of ETOH abuse    Hyperlipidemia    Hypertension    Pulmonary thromboembolism (HCC)    Seizure (HCC)    Thoracic aortic aneurysm [facility])     Past Surgical History:  Procedure Laterality Date   artificial aorta and pig valve  02/25/2020   CARDIAC CATHETERIZATION     CARDIAC SURGERY     CHOLECYSTECTOMY  2004   COLONOSCOPY     DENTAL SURGERY     IR KYPHO THORACIC WITH BONE BIOPSY  12/13/2021   IR RADIOLOGIST EVAL & MGMT  11/22/2021   LIVER BIOPSY  2004    (Not in a hospital admission)  Social History   Socioeconomic History   Marital status: Single    Spouse name: Not on file   Number of children: Not on file   Years of education: Not on file   Highest education level: Not on file  Occupational History   Not on file  Tobacco Use   Smoking status: Never   Smokeless tobacco: Never  Vaping Use   Vaping status: Not on file  Substance and Sexual Activity   Alcohol use: Yes    Comment: drinks a 5th a day   Drug use: Not Currently   Sexual activity: Yes  Other Topics Concern   Not on file  Social History Narrative   ** Merged  History Encounter **       Social Drivers of Corporate investment banker Strain: Not on file  Food Insecurity: No Food Insecurity (04/15/2023)   Hunger Vital Sign    Worried About Running Out of Food in the Last Year: Never true    Ran Out of Food in the Last Year: Never true  Transportation Needs: No Transportation Needs (04/15/2023)   PRAPARE - Administrator, Civil Service (Medical): No    Lack of Transportation (Non-Medical): No  Physical Activity: Not on file  Stress: Not on file  Social Connections: Not on file  Intimate Partner Violence: Not At Risk (04/15/2023)   Humiliation, Afraid, Rape, and Kick questionnaire    Fear of Current or Ex-Partner: No    Emotionally Abused: No    Physically  Abused: No    Sexually Abused: No    History reviewed. No pertinent family history.   Vitals:   04/16/23 1200 04/16/23 1330 04/16/23 1400 04/16/23 1545  BP: 104/70 120/82 121/86 (!) 82/58  Pulse: 70 70 85 77  Resp: 15 13  13   Temp:      TempSrc:      SpO2: 91% 93% 95% 91%  Weight:      Height:        PHYSICAL EXAM General: Well-appearing [name], well nourished, in no acute distress. HEENT: Normocephalic and atraumatic. Neck: No JVD.  Lungs: Normal respiratory effort on room air. Clear bilaterally to auscultation. No wheezes, crackles, rhonchi.  Heart: HRRR. + Murmur.  Abdomen: Non-distended appearing.  Msk: Normal strength and tone for age. Extremities: Warm and well perfused. No clubbing, cyanosis.  No edema.  Neuro: Alert and oriented X 3. Psych: Answers questions appropriately.   Labs: Basic Metabolic Panel: Recent Labs    04/15/23 0316 04/15/23 0514 04/16/23 0351  NA 132*  --  138  K 3.1*  --  3.2*  CL 92*  --  97*  CO2 21*  --  26  GLUCOSE 91  --  90  BUN 13  --  18  CREATININE 1.45*  --  0.99  CALCIUM 7.8*  --  7.5*  MG  --  1.3* 1.4*   Liver Function Tests: Recent Labs    04/15/23 0514  AST 117*  ALT 76*  ALKPHOS 49  BILITOT 2.4*  PROT 7.5  ALBUMIN 4.3   No results for input(s): "LIPASE", "AMYLASE" in the last 72 hours. CBC: Recent Labs    04/15/23 0316  WBC 6.2  HGB 14.8  HCT 42.0  MCV 85.4  PLT 149*   Cardiac Enzymes: Recent Labs    04/15/23 0316 04/15/23 0514  TROPONINIHS 23* 18*   BNP: No results for input(s): "BNP" in the last 72 hours. D-Dimer: No results for input(s): "DDIMER" in the last 72 hours. Hemoglobin A1C: No results for input(s): "HGBA1C" in the last 72 hours. Fasting Lipid Panel: No results for input(s): "CHOL", "HDL", "LDLCALC", "TRIG", "CHOLHDL", "LDLDIRECT" in the last 72 hours. Thyroid Function Tests: No results for input(s): "TSH", "T4TOTAL", "T3FREE", "THYROIDAB" in the last 72 hours.  Invalid input(s):  "FREET3" Anemia Panel: No results for input(s): "VITAMINB12", "FOLATE", "FERRITIN", "TIBC", "IRON", "RETICCTPCT" in the last 72 hours.   Radiology: CT HEAD WO CONTRAST ( ) Result Date: 04/15/2023 CLINICAL DATA:  Mental status change EXAM: CT HEAD WITHOUT CONTRAST TECHNIQUE: Contiguous axial images were obtained from the base of the skull through the vertex without intravenous contrast. RADIATION DOSE REDUCTION: This exam was performed according  to the departmental dose-optimization program which includes automated exposure control, adjustment of the mA and/or kV according to patient size and/or use of iterative reconstruction technique. COMPARISON:  04/11/2023 FINDINGS: Brain: No acute intracranial hemorrhage. No CT evidence of acute infarct. Nonspecific hypoattenuation in the periventricular and subcortical white matter favored to reflect chronic microvascular ischemic changes. 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 No edema, mass effect, or midline shift. The basilar cisterns are patent. Ventricles: Prominence of the ventricles suggesting underlying parenchymal volume loss. Vascular: No hyperdense vessel or unexpected calcification. Skull: No acute or aggressive finding. Orbits: The orbits are symmetric. Sinuses: Mild mucosal thickening in the ethmoid sinuses. Other: Focal soft tissue swelling over the right forehead and right supraorbital ridge. Mastoid air cells are clear. IMPRESSION: No CT evidence of acute intracranial abnormality. Small right forehead hematoma is slightly decreased from prior. Chronic changes as above. 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 Electronically Signed   By: Emily Filbert M.D.   On: 04/15/2023 12:53   DG Lumbar Spine Complete Result Date: 04/12/2023 CLINICAL DATA:  Status post fall. EXAM: LUMBAR SPINE - COMPLETE 4+ VIEW COMPARISON:  November 04, 2021 FINDINGS: There is no evidence of an acute lumbar spine fracture. A chronic compression fracture deformity  and subsequent vertebroplasty is seen at the level of T12. Additional mild chronic and degenerative changes are seen along the superior endplate of the L4 vertebral body. Alignment is normal. Intervertebral disc spaces are maintained. IMPRESSION: 1. 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 2. No acute lumbar spine fracture. Electronically Signed   By: Aram Candela M.D.   On: 04/12/2023 04:17   CT Head Wo Contrast Result Date: 04/11/2023 CLINICAL DATA:  Level 1 fall.  On blood thinners. EXAM: CT HEAD WITHOUT CONTRAST CT MAXILLOFACIAL WITHOUT CONTRAST CT CERVICAL SPINE WITHOUT CONTRAST TECHNIQUE: Multidetector CT imaging of the head, cervical spine, and maxillofacial structures were performed using the standard protocol without intravenous contrast. Multiplanar CT image reconstructions of the cervical spine and maxillofacial structures were also generated. RADIATION DOSE REDUCTION: This exam was performed according to the departmental dose-optimization program which includes automated exposure control, adjustment of the mA and/or kV according to patient size and/or use of iterative reconstruction technique. COMPARISON:  CT head 03/03/2022 and CT cervical spine 11/04/2021 FINDINGS: CT HEAD FINDINGS Brain: No evidence of acute infarction, hemorrhage, hydrocephalus, extra-axial collection or mass lesion/mass effect. Vascular: No hyperdense vessel or unexpected calcification. Skull: Normal. Negative for fracture or focal lesion. Right forehead hematoma. Other: None. CT MAXILLOFACIAL FINDINGS Osseous: No fracture or mandibular dislocation. No destructive process. Orbits: Negative. No traumatic or inflammatory finding. Sinuses: Mild mucosal thickening in the ethmoid air cells and maxillary sinuses. No acute abnormality. Soft tissues: Negative. CT CERVICAL SPINE FINDINGS Alignment: No evidence of traumatic listhesis. Skull base and vertebrae: No acute fracture. Soft  tissues and spinal canal: No prevertebral fluid or swelling. No visible canal hematoma. Disc levels: Mild multilevel spondylosis. No significant spinal canal narrowing. Upper chest: No acute abnormality. Other: None. IMPRESSION: 1. No acute intracranial abnormality. Right forehead hematoma. No calvarial fracture. 2. No acute facial fracture. 3. No acute fracture in the cervical spine. Level 1 trauma results were called by telephone at the time of interpretation on 04/11/2023 at 11:52 pm to provider [facility] , who verbally acknowledged these results. Electronically Signed   By: Minerva Fester M.D.   On: 04/11/2023 23:52   CT Cervical Spine Wo Contrast Result Date: 04/11/2023 CLINICAL DATA:  Level 1 fall.  On blood thinners. EXAM: CT HEAD WITHOUT CONTRAST CT MAXILLOFACIAL WITHOUT CONTRAST CT CERVICAL SPINE WITHOUT CONTRAST TECHNIQUE: Multidetector CT imaging of the head, cervical spine, and maxillofacial structures were performed using the standard protocol without intravenous contrast. Multiplanar CT image reconstructions of the cervical spine and maxillofacial structures were also generated. RADIATION DOSE REDUCTION: This exam was performed according to the departmental dose-optimization program which includes automated exposure control, adjustment of the mA and/or kV according to patient size and/or use of iterative reconstruction technique. COMPARISON:  CT head 03/03/2022 and CT cervical spine 11/04/2021 FINDINGS: CT HEAD FINDINGS Brain: No evidence of acute infarction, hemorrhage, hydrocephalus, extra-axial collection or mass lesion/mass effect. Vascular: No hyperdense vessel or unexpected calcification. Skull: Normal. Negative for fracture or focal lesion. Right forehead hematoma. Other: None. CT MAXILLOFACIAL FINDINGS Osseous: No fracture or mandibular dislocation. No destructive process. Orbits: Negative. No traumatic or inflammatory finding. Sinuses: Mild mucosal thickening in the ethmoid air cells  and maxillary sinuses. No acute abnormality. Soft tissues: Negative. CT CERVICAL SPINE FINDINGS Alignment: No evidence of traumatic listhesis. Skull base and vertebrae: No acute fracture. Soft tissues and spinal canal: No prevertebral fluid or swelling. No visible canal hematoma. Disc levels: Mild multilevel spondylosis. No significant spinal canal narrowing. Upper chest: No acute abnormality. Other: None. IMPRESSION: 1. No acute intracranial abnormality. Right forehead hematoma. No calvarial fracture. 2. No acute facial fracture. 3. No acute fracture in the cervical spine. Level 1 trauma results were called by telephone at the time of interpretation on 04/11/2023 at 11:52 pm to provider [facility] , who verbally acknowledged these results. Electronically Signed   By: Minerva Fester M.D.   On: 04/11/2023 23:52   CT Maxillofacial Wo Contrast Result Date: 04/11/2023 CLINICAL DATA:  Level 1 fall.  On blood thinners. EXAM: CT HEAD WITHOUT CONTRAST CT MAXILLOFACIAL WITHOUT CONTRAST CT CERVICAL SPINE WITHOUT CONTRAST TECHNIQUE: Multidetector CT imaging of the head, cervical spine, and maxillofacial structures were performed using the standard protocol without intravenous contrast. Multiplanar CT image reconstructions of the cervical spine and maxillofacial structures were also generated. RADIATION DOSE REDUCTION: This exam was performed according to the departmental dose-optimization program which includes automated exposure control, adjustment of the mA and/or kV according to patient size and/or use of iterative reconstruction technique. COMPARISON:  CT head 03/03/2022 and CT cervical spine 11/04/2021 FINDINGS: CT HEAD FINDINGS Brain: No evidence of acute infarction, hemorrhage, hydrocephalus, extra-axial collection or mass lesion/mass effect. Vascular: No hyperdense vessel or unexpected calcification. Skull: Normal. Negative for fracture or focal lesion. Right forehead hematoma. Other: None. CT MAXILLOFACIAL  FINDINGS Osseous: No fracture or mandibular dislocation. No destructive process. Orbits: Negative. No traumatic or inflammatory finding. Sinuses: Mild mucosal thickening in the ethmoid air cells and maxillary sinuses. No acute abnormality. Soft tissues: Negative. CT CERVICAL SPINE FINDINGS Alignment: No evidence of traumatic listhesis. Skull base and vertebrae: No acute fracture. Soft tissues and spinal canal: No prevertebral fluid or swelling. No visible canal hematoma. Disc levels: Mild multilevel spondylosis. No significant spinal canal narrowing. Upper chest: No acute abnormality. Other: None. IMPRESSION: 1. No acute intracranial abnormality. Right forehead hematoma. No calvarial fracture. 2. No acute facial fracture. 3. No acute fracture in the cervical spine. Level 1 trauma results were called by telephone at the time of interpretation on 04/11/2023 at 11:52 pm to provider [facility] , who verbally acknowledged these results. Electronically Signed   By: Minerva Fester M.D.   On: 04/11/2023 23:52   DG Chest Port 1  View Result Date: 04/11/2023 CLINICAL DATA:  Larey Seat, intoxicated EXAM: PORTABLE CHEST 1 VIEW COMPARISON:  03/03/2022 FINDINGS: Single frontal view of the chest demonstrates stable postsurgical changes from median sternotomy and aortic valve replacement. The cardiac silhouette is unremarkable. No acute airspace disease, effusion, or pneumothorax. No acute bony abnormalities. IMPRESSION: 1. No acute intrathoracic process. Electronically Signed   By: Sharlet Salina M.D.   On: 04/11/2023 23:48    ECHO pending  TELEMETRY reviewed by me 04/16/2023: Sinus rhythm rate 80s  EKG reviewed by me: Sinus rhythm rate 66 bpm, there have been no EKGs this admission with atrial fibrillation  Data reviewed by me 04/16/2023: last 24h vitals tele labs imaging I/O ED provider note, admission H&P  Principal Problem:   Alcohol withdrawal (HCC) Active Problems:   Alcoholic hepatitis without ascites   A-fib  (HCC)    ASSESSMENT AND PLAN:  Liem Copenhaver is a 53 y.o. [name]  with a past medical history of ascending aortic aneurysm s/p aortic root repair and bioprosthetic AVR 02/2020, paroxysmal atrial fibrillation, asthma, anxiety/depression, seizure disorder who presented to the ED on 04/15/2023 for dizziness, frequent falls. Cardiology was consulted for further evaluation.   # Aortic aneurysm s/p aortic root repair and bioprosthetic AVR (02/2020) # Paroxysmal atrial fibrillation # Frequent Falls # Intermittent hypotension Patient presents to the ED due to dizziness and frequent falls. Patient reports history of AF, however there has been no sign of AF since this admission. Na 138, K 3.2, Mg 1.4. LFTs elevated. Troponin are minimally elevated and flat (23 > 18). Patient is in sinus rhythm with stable HR. BP is borderline. Cr slightly up on presentation, now improved. - Continue Eliquis 5 mg BID for stroke risk prevention.  - Continue Coreg 6.25 mg BID - Patient taking amio, atorvastatin at home. Held due to elevated LFTs. Will recheck and resume as able.  - Continue to replenish electrolytes  - Echo ordered. - Plan for holter monitor placement upon discharge.   This patient's plan of care was discussed and created with Dr. Corky Sing and he is in agreement.  Signed: Gale Journey, PA-C  04/16/2023, 4:11 PM [facility] Cardiology

## 2023-04-16 NOTE — ED Notes (Signed)
 Dr. Lynne Logan notified of pt BP 82/58. Pt denies dizziness, lightheadedness, headache or shortness of breath. No new orders received.

## 2023-04-16 NOTE — Progress Notes (Signed)
 Progress Note   Patient: Brian [name] ZDG:[id number] DOB: 1970-06-20 DOA: 04/15/2023     0 DOS: the patient was seen and examined on 04/16/2023   Brief hospital course: Brian [name] is a 53 y.o. male with medical history significant of alcohol abuse, PAF on Eliquis, aortic valve replacement follows [facility], hypertension, mild intermittent asthma, anxiety/depression, seizure disorder, presented with multiple complaints including palpitations, nausea, vomiting, unsteady gait and frequent falls.   Assessment and Plan: Alcohol use disorder: Patient does not have withdrawal symptoms at this time. Continue CIWA protocol with as needed benzos. Strongly recommended to quit alcohol. Continue thiamine, folate and multivitamin.  Fall: Right forehead contusion CT head with no intracranial bleed. PT OT evaluation for safe discharge plan.  Paroxysmal atrial fibrillation: Brief RVR episode upon presentation in the ED. This is likely related to alcohol withdrawal, electrolyte imbalances. Cardiology evaluation called, advised echocardiogram. Continue to monitor daily electrolytes, replete accordingly. Continue home dose amiodarone, Coreg and Eliquis therapy.  Hypokalemia: Oral replacements ordered.  Hypomagnesemia: Will continue oral replacements daily.  Hypotension: Hold ARB therapy. Decreased Coreg dose to 6.25 mg twice daily.  Asthma COPD: Stable. Continue home inhalers, bronchodilators.    Out of bed to chair. Incentive spirometry. Nursing supportive care. Fall, aspiration precautions. Diet:  Diet Orders (From admission, onward)     Start     Ordered   04/15/23 1124  Diet Heart Room service appropriate? Yes; Fluid consistency: Thin  Diet effective now       Question Answer Comment  Room service appropriate? Yes   Fluid consistency: Thin      04/15/23 1124           DVT prophylaxis: apixaban (ELIQUIS) tablet 5 mg Start: 04/15/23 2200 apixaban (ELIQUIS) tablet 5  mg  Level of care: medical - tele   Code Status: Full Code  Subjective: Patient is seen and examined today morning. He is lying in bed. His BP lower side, has no symptoms. Advised to work with PT. Has no withdrawal symptoms.  Physical Exam: Vitals:   04/16/23 1550 04/16/23 1610 04/16/23 1620 04/16/23 1622  BP: 95/70 105/62 105/62   Pulse: 77 79 69   Resp: 16 16    Temp:    98.2 F (36.8 C)  TempSrc:    Oral  SpO2: 94% 95%    Weight:      Height:        General - Middle aged Caucasian male, no apparent distress HEENT - PERRLA, EOMI, right eye bruise, laceration injury noted, non tender sinuses. Lung - Clear, no rales, rhonchi, wheezes. Heart - S1, S2 heard, no murmurs, rubs, trace pedal edema. Abdomen - Soft, non tender, bowel sounds good Neuro - Alert, awake and oriented x 3, non focal exam. Skin - Warm and dry.  Data Reviewed:      Latest Ref Rng & Units 04/15/2023    3:16 AM 04/11/2023   11:34 PM 04/11/2023   11:29 PM  CBC  WBC 4.0 - 10.5 K/uL 6.2  7.1    Hemoglobin 13.0 - 17.0 g/dL 59.5  63.8  75.6   Hematocrit 39.0 - 52.0 % 42.0  45.0  48.0   Platelets 150 - 400 K/uL 149  148        Latest Ref Rng & Units 04/16/2023    3:51 AM 04/15/2023    3:16 AM 04/11/2023   11:34 PM  BMP  Glucose 70 - 99 mg/dL 90  91  433   BUN 6 - 20  mg/dL 18  13  10    Creatinine 0.61 - 1.24 mg/dL 1.61  0.96  0.45   Sodium 135 - 145 mmol/L 138  132  136   Potassium 3.5 - 5.1 mmol/L 3.2  3.1  3.3   Chloride 98 - 111 mmol/L 97  92  96   CO2 22 - 32 mmol/L 26  21  17    Calcium 8.9 - 10.3 mg/dL 7.5  7.8  7.8    CT HEAD WO CONTRAST ( ) Result Date: 04/15/2023 CLINICAL DATA:  Mental status change EXAM: CT HEAD WITHOUT CONTRAST TECHNIQUE: Contiguous axial images were obtained from the base of the skull through the vertex without intravenous contrast. RADIATION DOSE REDUCTION: This exam was performed according to the departmental dose-optimization program which includes automated exposure  control, adjustment of the mA and/or kV according to patient size and/or use of iterative reconstruction technique. COMPARISON:  04/11/2023 FINDINGS: Brain: No acute intracranial hemorrhage. No CT evidence of acute infarct. Nonspecific hypoattenuation in the periventricular and subcortical white matter favored to reflect chronic microvascular ischemic changes. 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 No edema, mass effect, or midline shift. The basilar cisterns are patent. Ventricles: Prominence of the ventricles suggesting underlying parenchymal volume loss. Vascular: No hyperdense vessel or unexpected calcification. Skull: No acute or aggressive finding. Orbits: The orbits are symmetric. Sinuses: Mild mucosal thickening in the ethmoid sinuses. Other: Focal soft tissue swelling over the right forehead and right supraorbital ridge. Mastoid air cells are clear. IMPRESSION: No CT evidence of acute intracranial abnormality. Small right forehead hematoma is slightly decreased from prior. Chronic changes as above. 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 Electronically Signed   By: Emily Filbert M.D.   On: 04/15/2023 12:53    Family Communication: Discussed with patient, he understand and agree. All questions answered.  Disposition: Status is: Observation The patient remains OBS appropriate and will d/c before 2 midnights.  Planned Discharge Destination: Home     Time spent: 38 minutes  Author: Marcelino Duster, MD 04/16/2023 4:47 PM Secure chat 7am to 7pm For on call review [email or web address].

## 2023-04-17 ENCOUNTER — Observation Stay: Admit: 2023-04-17 | Discharge: 2023-04-17 | Disposition: A | Discharge status: Home / Self Care | Attending: Student

## 2023-04-17 ENCOUNTER — Other Ambulatory Visit

## 2023-04-17 DIAGNOSIS — I480 Paroxysmal atrial fibrillation: Secondary | ICD-10-CM | POA: Diagnosis not present

## 2023-04-17 DIAGNOSIS — F10930 Alcohol use, unspecified with withdrawal, uncomplicated: Secondary | ICD-10-CM | POA: Diagnosis not present

## 2023-04-17 DIAGNOSIS — K7010 Alcoholic hepatitis without ascites: Secondary | ICD-10-CM | POA: Diagnosis not present

## 2023-04-17 DIAGNOSIS — Z952 Presence of prosthetic heart valve: Secondary | ICD-10-CM | POA: Diagnosis not present

## 2023-04-17 LAB — COMPREHENSIVE METABOLIC PANEL
ALT: 61 U/L — ABNORMAL HIGH (ref 0–44)
AST: 110 U/L — ABNORMAL HIGH (ref 15–41)
Albumin: 3.9 g/dL (ref 3.5–5.0)
Alkaline Phosphatase: 45 U/L (ref 38–126)
Anion gap: 11 (ref 5–15)
BUN: 15 mg/dL (ref 6–20)
CO2: 27 mmol/L (ref 22–32)
Calcium: 8.1 mg/dL — ABNORMAL LOW (ref 8.9–10.3)
Chloride: 96 mmol/L — ABNORMAL LOW (ref 98–111)
Creatinine, Ser: 0.85 mg/dL (ref 0.61–1.24)
GFR, Estimated: >60 mL/min (ref >60)
Glucose, Bld: 120 mg/dL — ABNORMAL HIGH (ref 70–99)
Potassium: 3.4 mmol/L — ABNORMAL LOW (ref 3.5–5.1)
Sodium: 134 mmol/L — ABNORMAL LOW (ref 135–145)
Total Bilirubin: 1.3 mg/dL — ABNORMAL HIGH (ref 0.0–1.2)
Total Protein: 6.8 g/dL (ref 6.5–8.1)

## 2023-04-17 LAB — ECHOCARDIOGRAM COMPLETE
AR max vel: 2.15 cm2
AV Area VTI: 2.24 cm2
AV Area mean vel: 2.13 cm2
AV Mean grad: 6 mmHg
AV Peak grad: 10 mmHg
Ao pk vel: 1.58 m/s
Area-P 1/2: 3.66 cm2
Calc EF: 45 %
Height: 73 in
MV VTI: 4.44 cm2
S' Lateral: 3.1 cm
Single Plane A2C EF: 51.4 %
Single Plane A4C EF: 46.2 %
Weight: 3601.43 [oz_av]

## 2023-04-17 LAB — MAGNESIUM: Magnesium: 1.4 mg/dL — ABNORMAL LOW (ref 1.7–2.4)

## 2023-04-17 MED ORDER — HYDROXYZINE HCL 10 MG PO TABS
10.0000 mg | ORAL_TABLET | Freq: Three times a day (TID) | ORAL | Status: DC | PRN
  Filled 2023-04-17: qty 1

## 2023-04-17 MED ORDER — MAGNESIUM SULFATE 50 % IJ SOLN: 1.0000 g | Freq: Once | INTRAMUSCULAR | Status: DC

## 2023-04-17 MED ORDER — OXYCODONE-ACETAMINOPHEN 5-325 MG PO TABS
1.0000 | ORAL_TABLET | Freq: Four times a day (QID) | ORAL | Status: DC | PRN
  Administered 2023-04-17 – 2023-04-18 (×4): 1 via ORAL
  Filled 2023-04-17 (×4): qty 1

## 2023-04-17 MED ORDER — MAGNESIUM SULFATE IN D5W 1-5 GM/100ML-% IV SOLN
1.0000 g | Freq: Once | INTRAVENOUS | Status: AC
  Administered 2023-04-17: 1 g via INTRAVENOUS
  Filled 2023-04-17: qty 100

## 2023-04-17 MED ORDER — POTASSIUM CHLORIDE 20 MEQ PO PACK
40.0000 meq | PACK | Freq: Two times a day (BID) | ORAL | Status: DC
  Administered 2023-04-17 (×2): 40 meq via ORAL
  Filled 2023-04-17: qty 2

## 2023-04-17 NOTE — Progress Notes (Signed)
*  PRELIMINARY RESULTS* Echocardiogram 2D Echocardiogram has been performed.  Brian [name] 04/17/2023, 2:56 PM

## 2023-04-17 NOTE — Plan of Care (Signed)

## 2023-04-17 NOTE — TOC CM/SW Note (Signed)
 Transition of Care [facility]) - Inpatient Brief Assessment   Patient Details  Name: Brian [name] MRN: [id number] Date of Birth: 1970-02-13  Transition of Care [facility]) CM/SW Contact:    Chapman Fitch, RN Phone Number: 04/17/2023, 9:41 AM   Clinical Narrative:   Transition of Care [facility]) Screening Note   Patient Details  Name: Brian [name] Date of Birth: [birth year]   Transition of Care (TOC) CM/SW Contact:    Chapman Fitch, RN Phone Number: 04/17/2023, 9:41 AM    Transition of Care Department [facility]) has reviewed patient and no TOC needs have been identified at this time.  If new patient transition needs arise, please place a TOC consult.    Transition of Care Asessment: Insurance and Status: Insurance coverage has been reviewed Patient has primary care physician: Yes     Prior/Current Home Services: No current home services Social Drivers of Health Review: SDOH reviewed no interventions necessary Readmission risk has been reviewed: Yes Transition of care needs: no transition of care needs at this time

## 2023-04-17 NOTE — Progress Notes (Signed)
 Progress Note   Patient: Brian [name] ZOX:[id number] DOB: Oct 07, 1970 DOA: 04/15/2023     0 DOS: the patient was seen and examined on 04/17/2023   Brief hospital course: Assad Harbeson is a 53 y.o. male with medical history significant of alcohol abuse, PAF on Eliquis, aortic valve replacement follows [facility], hypertension, mild intermittent asthma, anxiety/depression, seizure disorder, presented with multiple complaints including palpitations, nausea, vomiting, unsteady gait and frequent falls.   Assessment and Plan: Alcohol use disorder: Patient does not have withdrawal symptoms at this time. Continue CIWA protocol with as needed benzos. Strongly recommended to quit alcohol. Continue thiamine, folate and multivitamin.  Fall: Right forehead contusion- Check orthostatic vitals. CT head with no intracranial bleed. Advised to limit opiates. PT OT evaluation for safe discharge plan.  Paroxysmal atrial fibrillation: H/o aortic valve replacement- Brief RVR episode upon presentation in the ED. This is likely related to alcohol withdrawal, electrolyte imbalances. Cardiology evaluation advised echocardiogram which is pending. Continue to monitor daily electrolytes, replete accordingly. Continue home dose amiodarone, Coreg and Eliquis therapy. Will need holter monitor outpatient.  Hypokalemia: Oral replacements ordered.  Hypomagnesemia: IV and oral replacements daily.  Hypotension: Hold ARB therapy. Decreased Coreg dose to 6.25 mg twice daily. Limit opiates.  Asthma COPD: Stable. Continue home inhalers, bronchodilators.    Out of bed to chair. Incentive spirometry. Nursing supportive care. Fall, aspiration precautions. Diet:  Diet Orders (From admission, onward)     Start     Ordered   04/15/23 1124  Diet Heart Room service appropriate? Yes; Fluid consistency: Thin  Diet effective now       Question Answer Comment  Room service appropriate? Yes   Fluid  consistency: Thin      04/15/23 1124           DVT prophylaxis:  apixaban (ELIQUIS) tablet 5 mg  Level of care: medical - tele   Code Status: Full Code  Subjective: Patient is seen and examined today morning. He is lying in bed.He denies chest pain, shortness of breath or dizziness. Advised to work with PT. No withdrawal symptoms.  Physical Exam: Vitals:   04/17/23 1241 04/17/23 1242 04/17/23 1244 04/17/23 1248  BP: 110/79 106/85 99/73 105/81  Pulse: 69 80 89 90  Resp:      Temp:      TempSrc:      SpO2: 97% 97% 96% 100%  Weight:      Height:        General - Middle aged Caucasian male, no apparent distress HEENT - PERRLA, EOMI, right eye bruise, laceration injury noted, non tender sinuses. Lung - Clear, no rales, rhonchi, wheezes. Heart - S1, S2 heard, no murmurs, rubs, trace pedal edema. Abdomen - Soft, non tender, bowel sounds good Neuro - Alert, awake and oriented x 3, non focal exam. Skin - Warm and dry.  Data Reviewed:      Latest Ref Rng & Units 04/15/2023    3:16 AM 04/11/2023   11:34 PM 04/11/2023   11:29 PM  CBC  WBC 4.0 - 10.5 K/uL 6.2  7.1    Hemoglobin 13.0 - 17.0 g/dL 81.1  91.4  78.2   Hematocrit 39.0 - 52.0 % 42.0  45.0  48.0   Platelets 150 - 400 K/uL 149  148        Latest Ref Rng & Units 04/17/2023    5:17 AM 04/16/2023    3:51 AM 04/15/2023    3:16 AM  BMP  Glucose 70 - 99 mg/dL  120  90  91   BUN 6 - 20 mg/dL 15  18  13    Creatinine 0.61 - 1.24 mg/dL 1.61  0.96  0.45   Sodium 135 - 145 mmol/L 134  138  132   Potassium 3.5 - 5.1 mmol/L 3.4  3.2  3.1   Chloride 98 - 111 mmol/L 96  97  92   CO2 22 - 32 mmol/L 27  26  21    Calcium 8.9 - 10.3 mg/dL 8.1  7.5  7.8    No results found.   Family Communication: Discussed with patient, he understand and agree. All questions answered.  Disposition: Status is: Observation The patient remains OBS appropriate and will d/c before 2 midnights.  Planned Discharge Destination: Home     Time  spent: 38 minutes  Author: Marcelino Duster, MD 04/17/2023 3:13 PM Secure chat 7am to 7pm For on call review [email or web address].

## 2023-04-17 NOTE — Progress Notes (Signed)
 [facility] Cardiology  CARDIOLOGY PROGRESS NOTE  Patient ID: Brian [name] MRN: [id number] DOB/AGE: 27-Jul-1970 53 y.o.  Admit date: 04/15/2023 Referring Physician Dr, Clide Dales Reason for Consultation syncope  HPI: Today patient details of dizziness on standing but overall symptoms improving.  Review of systems complete and found to be negative unless listed above     Past Medical History:  Diagnosis Date   A-fib (HCC)    ADD (attention deficit disorder)    ADHD    Alcohol abuse    Anxiety    Anxiety and depression    Asthma    Congenital heart defect    Depression    History of ETOH abuse    Hyperlipidemia    Hypertension    Pulmonary thromboembolism (HCC)    Seizure (HCC)    Thoracic aortic aneurysm [facility])     Past Surgical History:  Procedure Laterality Date   artificial aorta and pig valve  02/25/2020   CARDIAC CATHETERIZATION     CARDIAC SURGERY     CHOLECYSTECTOMY  2004   COLONOSCOPY     DENTAL SURGERY     IR KYPHO THORACIC WITH BONE BIOPSY  12/13/2021   IR RADIOLOGIST EVAL & MGMT  11/22/2021   LIVER BIOPSY  2004    Medications Prior to Admission  Medication Sig Dispense Refill Last Dose/Taking   albuterol (PROVENTIL HFA;VENTOLIN HFA) 108 (90 Base) MCG/ACT inhaler Inhale 1 puff into the lungs every 4 (four) hours as needed for wheezing or shortness of breath.   Taking As Needed   amiodarone (PACERONE) 400 MG tablet TAKE 1 TABLET(400 MG) BY MOUTH TWICE DAILY (Patient taking differently: Take 400 mg by mouth 2 (two) times daily.) 180 tablet 2 04/14/2023   atorvastatin (LIPITOR) 20 MG tablet Take 20 mg by mouth daily.   04/14/2023   carvedilol (COREG) 25 MG tablet Take 25 mg by mouth 2 (two) times daily.   04/14/2023   clonazePAM (KLONOPIN) 1 MG tablet Take 1 tablet by mouth 3 (three) times daily as needed.  2 04/14/2023   ELIQUIS 2.5 MG TABS tablet Take 5 mg by mouth 2 (two) times daily.   04/14/2023   fenofibrate (TRICOR) 145 MG tablet Take 145 mg by mouth daily.    04/14/2023   FOCALIN XR 20 MG 24 hr capsule Take 20 mg by mouth every evening.   04/14/2023   FOCALIN XR 30 MG CP24 Take 1 capsule by mouth every morning.   04/14/2023   furosemide (LASIX) 20 MG tablet Take 20 mg by mouth daily.   04/14/2023   lipase/protease/amylase (CREON) 36000 UNITS CPEP capsule Take 1 capsule by mouth. 2 cap qam and 2 cap at noon , 2 cap qpm and 1 cap qhs = 7 caps a day   04/14/2023   losartan (COZAAR) 50 MG tablet Take 50 mg by mouth daily.   04/14/2023   ondansetron (ZOFRAN-ODT) 8 MG disintegrating tablet Take 8 mg by mouth 2 (two) times daily.   04/14/2023   sertraline (ZOLOFT) 100 MG tablet Take 200 mg by mouth daily.  5 04/14/2023   SYMBICORT 80-4.5 MCG/ACT inhaler Inhale 2 puffs into the lungs at bedtime as needed.   Taking As Needed   tadalafil (CIALIS) 5 MG tablet Take 1 tablet (5 mg total) by mouth daily as needed for erectile dysfunction. 90 tablet 0 Taking As Needed   oxyCODONE (OXY IR/ROXICODONE) 5 MG immediate release tablet Take 5 mg by mouth 4 (four) times daily as needed. (Patient not taking: Reported  on 04/15/2023)   Not Taking   oxyCODONE-acetaminophen (PERCOCET/ROXICET) 5-325 MG tablet Take 1 tablet by mouth every 4 (four) hours as needed. (Patient not taking: Reported on 04/15/2023)   Not Taking   polyethylene glycol (MIRALAX / GLYCOLAX) 17 g packet Take 1 packet by mouth daily. (Patient not taking: Reported on 04/15/2023)   Not Taking   Social History   Socioeconomic History   Marital status: Single    Spouse name: Not on file   Number of children: Not on file   Years of education: Not on file   Highest education level: Not on file  Occupational History   Not on file  Tobacco Use   Smoking status: Never   Smokeless tobacco: Never  Vaping Use   Vaping status: Not on file  Substance and Sexual Activity   Alcohol use: Yes    Comment: drinks a 5th a day   Drug use: Not Currently   Sexual activity: Yes  Other Topics Concern   Not on file  Social History  Narrative   ** Merged History Encounter **       Social Drivers of Health   Financial Resource Strain: Not on file  Food Insecurity: No Food Insecurity (04/15/2023)   Hunger Vital Sign    Worried About Running Out of Food in the Last Year: Never true    Ran Out of Food in the Last Year: Never true  Transportation Needs: No Transportation Needs (04/15/2023)   PRAPARE - Administrator, Civil Service (Medical): No    Lack of Transportation (Non-Medical): No  Physical Activity: Not on file  Stress: Not on file  Social Connections: Not on file  Intimate Partner Violence: Not At Risk (04/15/2023)   Humiliation, Afraid, Rape, and Kick questionnaire    Fear of Current or Ex-Partner: No    Emotionally Abused: No    Physically Abused: No    Sexually Abused: No    History reviewed. No pertinent family history.    Review of systems complete and found to be negative unless listed above      PHYSICAL EXAM  Mild systolic murmur aortic area No wheeze No pedal edema Alert and oriented x 3  Labs:   Lab Results  Component Value Date   WBC 6.2 04/15/2023   HGB 14.8 04/15/2023   HCT 42.0 04/15/2023   MCV 85.4 04/15/2023   PLT 149 (L) 04/15/2023    Recent Labs  Lab 04/17/23 0517  NA 134*  K 3.4*  CL 96*  CO2 27  BUN 15  CREATININE 0.85  CALCIUM 8.1*  PROT 6.8  BILITOT 1.3*  ALKPHOS 45  ALT 61*  AST 110*  GLUCOSE 120*   Lab Results  Component Value Date   CKTOTAL 353 12/16/2021   TROPONINI <0.03 03/15/2016   No results found for: "CHOL" No results found for: "HDL" No results found for: "LDLCALC" No results found for: "TRIG" No results found for: "CHOLHDL" No results found for: "LDLDIRECT"    Radiology: CT HEAD WO CONTRAST ( ) Result Date: 04/15/2023 CLINICAL DATA:  Mental status change EXAM: CT HEAD WITHOUT CONTRAST TECHNIQUE: Contiguous axial images were obtained from the base of the skull through the vertex without intravenous contrast. RADIATION  DOSE REDUCTION: This exam was performed according to the departmental dose-optimization program which includes automated exposure control, adjustment of the mA and/or kV according to patient size and/or use of iterative reconstruction technique. COMPARISON:  04/11/2023 FINDINGS: Brain: No acute intracranial hemorrhage. No  CT evidence of acute infarct. Nonspecific hypoattenuation in the periventricular and subcortical white matter favored to reflect chronic microvascular ischemic changes. 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 No edema, mass effect, or midline shift. The basilar cisterns are patent. Ventricles: Prominence of the ventricles suggesting underlying parenchymal volume loss. Vascular: No hyperdense vessel or unexpected calcification. Skull: No acute or aggressive finding. Orbits: The orbits are symmetric. Sinuses: Mild mucosal thickening in the ethmoid sinuses. Other: Focal soft tissue swelling over the right forehead and right supraorbital ridge. Mastoid air cells are clear. IMPRESSION: No CT evidence of acute intracranial abnormality. Small right forehead hematoma is slightly decreased from prior. Chronic changes as above. 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 Electronically Signed   By: Emily Filbert M.D.   On: 04/15/2023 12:53   DG Lumbar Spine Complete Result Date: 04/12/2023 CLINICAL DATA:  Status post fall. EXAM: LUMBAR SPINE - COMPLETE 4+ VIEW COMPARISON:  November 04, 2021 FINDINGS: There is no evidence of an acute lumbar spine fracture. A chronic compression fracture deformity and subsequent vertebroplasty is seen at the level of T12. Additional mild chronic and degenerative changes are seen along the superior endplate of the L4 vertebral body. Alignment is normal. Intervertebral disc spaces are maintained. IMPRESSION: 1. 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 2. No acute lumbar spine fracture. Electronically Signed   By:  Aram Candela M.D.   On: 04/12/2023 04:17   CT Head Wo Contrast Result Date: 04/11/2023 CLINICAL DATA:  Level 1 fall.  On blood thinners. EXAM: CT HEAD WITHOUT CONTRAST CT MAXILLOFACIAL WITHOUT CONTRAST CT CERVICAL SPINE WITHOUT CONTRAST TECHNIQUE: Multidetector CT imaging of the head, cervical spine, and maxillofacial structures were performed using the standard protocol without intravenous contrast. Multiplanar CT image reconstructions of the cervical spine and maxillofacial structures were also generated. RADIATION DOSE REDUCTION: This exam was performed according to the departmental dose-optimization program which includes automated exposure control, adjustment of the mA and/or kV according to patient size and/or use of iterative reconstruction technique. COMPARISON:  CT head 03/03/2022 and CT cervical spine 11/04/2021 FINDINGS: CT HEAD FINDINGS Brain: No evidence of acute infarction, hemorrhage, hydrocephalus, extra-axial collection or mass lesion/mass effect. Vascular: No hyperdense vessel or unexpected calcification. Skull: Normal. Negative for fracture or focal lesion. Right forehead hematoma. Other: None. CT MAXILLOFACIAL FINDINGS Osseous: No fracture or mandibular dislocation. No destructive process. Orbits: Negative. No traumatic or inflammatory finding. Sinuses: Mild mucosal thickening in the ethmoid air cells and maxillary sinuses. No acute abnormality. Soft tissues: Negative. CT CERVICAL SPINE FINDINGS Alignment: No evidence of traumatic listhesis. Skull base and vertebrae: No acute fracture. Soft tissues and spinal canal: No prevertebral fluid or swelling. No visible canal hematoma. Disc levels: Mild multilevel spondylosis. No significant spinal canal narrowing. Upper chest: No acute abnormality. Other: None. IMPRESSION: 1. No acute intracranial abnormality. Right forehead hematoma. No calvarial fracture. 2. No acute facial fracture. 3. No acute fracture in the cervical spine. Level 1 trauma  results were called by telephone at the time of interpretation on 04/11/2023 at 11:52 pm to provider [facility] , who verbally acknowledged these results. Electronically Signed   By: Minerva Fester M.D.   On: 04/11/2023 23:52   CT Cervical Spine Wo Contrast Result Date: 04/11/2023 CLINICAL DATA:  Level 1 fall.  On blood thinners. EXAM: CT HEAD WITHOUT CONTRAST CT MAXILLOFACIAL WITHOUT CONTRAST CT CERVICAL SPINE WITHOUT CONTRAST TECHNIQUE: Multidetector CT imaging of the head, cervical spine, and maxillofacial structures were performed  using the standard protocol without intravenous contrast. Multiplanar CT image reconstructions of the cervical spine and maxillofacial structures were also generated. RADIATION DOSE REDUCTION: This exam was performed according to the departmental dose-optimization program which includes automated exposure control, adjustment of the mA and/or kV according to patient size and/or use of iterative reconstruction technique. COMPARISON:  CT head 03/03/2022 and CT cervical spine 11/04/2021 FINDINGS: CT HEAD FINDINGS Brain: No evidence of acute infarction, hemorrhage, hydrocephalus, extra-axial collection or mass lesion/mass effect. Vascular: No hyperdense vessel or unexpected calcification. Skull: Normal. Negative for fracture or focal lesion. Right forehead hematoma. Other: None. CT MAXILLOFACIAL FINDINGS Osseous: No fracture or mandibular dislocation. No destructive process. Orbits: Negative. No traumatic or inflammatory finding. Sinuses: Mild mucosal thickening in the ethmoid air cells and maxillary sinuses. No acute abnormality. Soft tissues: Negative. CT CERVICAL SPINE FINDINGS Alignment: No evidence of traumatic listhesis. Skull base and vertebrae: No acute fracture. Soft tissues and spinal canal: No prevertebral fluid or swelling. No visible canal hematoma. Disc levels: Mild multilevel spondylosis. No significant spinal canal narrowing. Upper chest: No acute abnormality. Other:  None. IMPRESSION: 1. No acute intracranial abnormality. Right forehead hematoma. No calvarial fracture. 2. No acute facial fracture. 3. No acute fracture in the cervical spine. Level 1 trauma results were called by telephone at the time of interpretation on 04/11/2023 at 11:52 pm to provider [facility] , who verbally acknowledged these results. Electronically Signed   By: Minerva Fester M.D.   On: 04/11/2023 23:52   CT Maxillofacial Wo Contrast Result Date: 04/11/2023 CLINICAL DATA:  Level 1 fall.  On blood thinners. EXAM: CT HEAD WITHOUT CONTRAST CT MAXILLOFACIAL WITHOUT CONTRAST CT CERVICAL SPINE WITHOUT CONTRAST TECHNIQUE: Multidetector CT imaging of the head, cervical spine, and maxillofacial structures were performed using the standard protocol without intravenous contrast. Multiplanar CT image reconstructions of the cervical spine and maxillofacial structures were also generated. RADIATION DOSE REDUCTION: This exam was performed according to the departmental dose-optimization program which includes automated exposure control, adjustment of the mA and/or kV according to patient size and/or use of iterative reconstruction technique. COMPARISON:  CT head 03/03/2022 and CT cervical spine 11/04/2021 FINDINGS: CT HEAD FINDINGS Brain: No evidence of acute infarction, hemorrhage, hydrocephalus, extra-axial collection or mass lesion/mass effect. Vascular: No hyperdense vessel or unexpected calcification. Skull: Normal. Negative for fracture or focal lesion. Right forehead hematoma. Other: None. CT MAXILLOFACIAL FINDINGS Osseous: No fracture or mandibular dislocation. No destructive process. Orbits: Negative. No traumatic or inflammatory finding. Sinuses: Mild mucosal thickening in the ethmoid air cells and maxillary sinuses. No acute abnormality. Soft tissues: Negative. CT CERVICAL SPINE FINDINGS Alignment: No evidence of traumatic listhesis. Skull base and vertebrae: No acute fracture. Soft tissues and spinal  canal: No prevertebral fluid or swelling. No visible canal hematoma. Disc levels: Mild multilevel spondylosis. No significant spinal canal narrowing. Upper chest: No acute abnormality. Other: None. IMPRESSION: 1. No acute intracranial abnormality. Right forehead hematoma. No calvarial fracture. 2. No acute facial fracture. 3. No acute fracture in the cervical spine. Level 1 trauma results were called by telephone at the time of interpretation on 04/11/2023 at 11:52 pm to provider [facility] , who verbally acknowledged these results. Electronically Signed   By: Minerva Fester M.D.   On: 04/11/2023 23:52   DG Chest Port 1 View Result Date: 04/11/2023 CLINICAL DATA:  Larey Seat, intoxicated EXAM: PORTABLE CHEST 1 VIEW COMPARISON:  03/03/2022 FINDINGS: Single frontal view of the chest demonstrates stable postsurgical changes from median sternotomy and aortic valve replacement.  The cardiac silhouette is unremarkable. No acute airspace disease, effusion, or pneumothorax. No acute bony abnormalities. IMPRESSION: 1. No acute intrathoracic process. Electronically Signed   By: Sharlet Salina M.D.   On: 04/11/2023 23:48     ASSESSMENT AND PLAN:  Fall/syncope Aortic aneurysm status post aortic root repair Bioprosthetic aortic valve replacement Paroxysmal atrial fibrillation, now in sinus rhythm Intermittent hypotension, improved Hypomagnesemia  Blood pressure overall improved after holding ARB and lowering Coreg dose. Continue p.o. hydration Echocardiogram pending Continue Eliquis for anticoagulation Replace magnesium Plan for cardiac monitor at discharge  Signed: Kathryne Gin MD 04/17/2023, 4:56 PM

## 2023-04-17 NOTE — Plan of Care (Signed)

## 2023-04-18 DIAGNOSIS — I480 Paroxysmal atrial fibrillation: Secondary | ICD-10-CM | POA: Diagnosis not present

## 2023-04-18 DIAGNOSIS — R296 Repeated falls: Secondary | ICD-10-CM | POA: Diagnosis not present

## 2023-04-18 DIAGNOSIS — F10930 Alcohol use, unspecified with withdrawal, uncomplicated: Secondary | ICD-10-CM | POA: Diagnosis not present

## 2023-04-18 DIAGNOSIS — E876 Hypokalemia: Secondary | ICD-10-CM

## 2023-04-18 DIAGNOSIS — K7010 Alcoholic hepatitis without ascites: Secondary | ICD-10-CM | POA: Diagnosis not present

## 2023-04-18 LAB — CBC
HCT: 39.4 % (ref 39.0–52.0)
Hemoglobin: 13.8 g/dL (ref 13.0–17.0)
MCH: 30 pg (ref 26.0–34.0)
MCHC: 35 g/dL (ref 30.0–36.0)
MCV: 85.7 fL (ref 80.0–100.0)
Platelets: 90 10*3/uL — ABNORMAL LOW (ref 150–400)
RBC: 4.6 MIL/uL (ref 4.22–5.81)
RDW: 11.9 % (ref 11.5–15.5)
WBC: 3.4 10*3/uL — ABNORMAL LOW (ref 4.0–10.5)
nRBC: 0 % (ref 0.0–0.2)

## 2023-04-18 LAB — BASIC METABOLIC PANEL
Anion gap: 5 (ref 5–15)
BUN: 14 mg/dL (ref 6–20)
CO2: 29 mmol/L (ref 22–32)
Calcium: 8.5 mg/dL — ABNORMAL LOW (ref 8.9–10.3)
Chloride: 100 mmol/L (ref 98–111)
Creatinine, Ser: 0.96 mg/dL (ref 0.61–1.24)
GFR, Estimated: >60 mL/min (ref >60)
Glucose, Bld: 114 mg/dL — ABNORMAL HIGH (ref 70–99)
Potassium: 4.7 mmol/L (ref 3.5–5.1)
Sodium: 134 mmol/L — ABNORMAL LOW (ref 135–145)

## 2023-04-18 LAB — MAGNESIUM: Magnesium: 1.5 mg/dL — ABNORMAL LOW (ref 1.7–2.4)

## 2023-04-18 MED ORDER — MAGNESIUM OXIDE -MG SUPPLEMENT 400 (240 MG) MG PO TABS: 400.0000 mg | ORAL_TABLET | Freq: Every day | ORAL | 0 refills | Status: DC

## 2023-04-18 MED ORDER — MAGNESIUM OXIDE -MG SUPPLEMENT 400 (240 MG) MG PO TABS: 400.0000 mg | ORAL_TABLET | Freq: Every day | ORAL | 0 refills | Status: AC

## 2023-04-18 MED ORDER — VITAMIN B-1 100 MG PO TABS: 100.0000 mg | ORAL_TABLET | Freq: Every day | ORAL | 1 refills | Status: DC

## 2023-04-18 MED ORDER — ADULT MULTIVITAMIN W/MINERALS CH: 1.0000 | ORAL_TABLET | Freq: Every day | ORAL | 2 refills | Status: AC

## 2023-04-18 MED ORDER — VITAMIN B-1 100 MG PO TABS: 100.0000 mg | ORAL_TABLET | Freq: Every day | ORAL | 1 refills | Status: AC

## 2023-04-18 MED ORDER — FOLIC ACID 1 MG PO TABS: 1.0000 mg | ORAL_TABLET | Freq: Every day | ORAL | 1 refills | Status: DC

## 2023-04-18 MED ORDER — CARVEDILOL 6.25 MG PO TABS: 6.2500 mg | ORAL_TABLET | Freq: Two times a day (BID) | ORAL | 1 refills | Status: DC

## 2023-04-18 MED ORDER — FOLIC ACID 1 MG PO TABS: 1.0000 mg | ORAL_TABLET | Freq: Every day | ORAL | 1 refills | Status: AC

## 2023-04-18 MED ORDER — ADULT MULTIVITAMIN W/MINERALS CH: 1.0000 | ORAL_TABLET | Freq: Every day | ORAL | 2 refills | Status: DC

## 2023-04-18 MED ORDER — LORAZEPAM 0.5 MG PO TABS
0.5000 mg | ORAL_TABLET | ORAL | Status: DC | PRN
  Administered 2023-04-18: 0.5 mg via ORAL
  Filled 2023-04-18: qty 1

## 2023-04-18 NOTE — Progress Notes (Signed)
 [facility] CLINIC CARDIOLOGY PROGRESS NOTE       Patient ID: Brian [name] MRN: [id number] DOB/AGE: January 03, 1971 53 y.o.  Admit date: 04/15/2023 Referring Physician Dr. Clide Dales Primary Physician Willeen Cass, Myna Bright, MD  Primary Cardiologist Dr. Allena Katz [facility]) Reason for Consultation atrial fibrillation, syncope  HPI: Swan Brian [name] is a 53 y.o. male  with a past medical history of ascending aortic aneurysm s/p aortic root repair and bioprosthetic AVR 02/2020, paroxysmal atrial fibrillation, asthma, anxiety/depression, seizure disorder who presented to the ED on 04/15/2023 for dizziness, frequent falls. Cardiology was consulted for further evaluation.   Interval history: -Patient seen and examined this morning, resting comfortably in bed. -No evidence of any arrhythmias on telemetry. -BP is overall improved. -Echo results reviewed with patient.  Review of systems complete and found to be negative unless listed above    Past Medical History:  Diagnosis Date   A-fib (HCC)    ADD (attention deficit disorder)    ADHD    Alcohol abuse    Anxiety    Anxiety and depression    Asthma    Congenital heart defect    Depression    History of ETOH abuse    Hyperlipidemia    Hypertension    Pulmonary thromboembolism (HCC)    Seizure (HCC)    Thoracic aortic aneurysm [facility])     Past Surgical History:  Procedure Laterality Date   artificial aorta and pig valve  02/25/2020   CARDIAC CATHETERIZATION     CARDIAC SURGERY     CHOLECYSTECTOMY  2004   COLONOSCOPY     DENTAL SURGERY     IR KYPHO THORACIC WITH BONE BIOPSY  12/13/2021   IR RADIOLOGIST EVAL & MGMT  11/22/2021   LIVER BIOPSY  2004    Medications Prior to Admission  Medication Sig Dispense Refill Last Dose/Taking   albuterol (PROVENTIL HFA;VENTOLIN HFA) 108 (90 Base) MCG/ACT inhaler Inhale 1 puff into the lungs every 4 (four) hours as needed for wheezing or shortness of breath.   Taking As Needed   amiodarone (PACERONE)  400 MG tablet TAKE 1 TABLET(400 MG) BY MOUTH TWICE DAILY (Patient taking differently: Take 400 mg by mouth 2 (two) times daily.) 180 tablet 2 04/14/2023   atorvastatin (LIPITOR) 20 MG tablet Take 20 mg by mouth daily.   04/14/2023   carvedilol (COREG) 25 MG tablet Take 25 mg by mouth 2 (two) times daily.   04/14/2023   clonazePAM (KLONOPIN) 1 MG tablet Take 1 tablet by mouth 3 (three) times daily as needed.  2 04/14/2023   ELIQUIS 2.5 MG TABS tablet Take 5 mg by mouth 2 (two) times daily.   04/14/2023   fenofibrate (TRICOR) 145 MG tablet Take 145 mg by mouth daily.   04/14/2023   FOCALIN XR 20 MG 24 hr capsule Take 20 mg by mouth every evening.   04/14/2023   FOCALIN XR 30 MG CP24 Take 1 capsule by mouth every morning.   04/14/2023   furosemide (LASIX) 20 MG tablet Take 20 mg by mouth daily.   04/14/2023   lipase/protease/amylase (CREON) 36000 UNITS CPEP capsule Take 1 capsule by mouth. 2 cap qam and 2 cap at noon , 2 cap qpm and 1 cap qhs = 7 caps a day   04/14/2023   losartan (COZAAR) 50 MG tablet Take 50 mg by mouth daily.   04/14/2023   ondansetron (ZOFRAN-ODT) 8 MG disintegrating tablet Take 8 mg by mouth 2 (two) times daily.   04/14/2023   sertraline (ZOLOFT)  100 MG tablet Take 200 mg by mouth daily.  5 04/14/2023   SYMBICORT 80-4.5 MCG/ACT inhaler Inhale 2 puffs into the lungs at bedtime as needed.   Taking As Needed   tadalafil (CIALIS) 5 MG tablet Take 1 tablet (5 mg total) by mouth daily as needed for erectile dysfunction. 90 tablet 0 Taking As Needed   oxyCODONE (OXY IR/ROXICODONE) 5 MG immediate release tablet Take 5 mg by mouth 4 (four) times daily as needed. (Patient not taking: Reported on 04/15/2023)   Not Taking   oxyCODONE-acetaminophen (PERCOCET/ROXICET) 5-325 MG tablet Take 1 tablet by mouth every 4 (four) hours as needed. (Patient not taking: Reported on 04/15/2023)   Not Taking   polyethylene glycol (MIRALAX / GLYCOLAX) 17 g packet Take 1 packet by mouth daily. (Patient not taking: Reported  on 04/15/2023)   Not Taking   Social History   Socioeconomic History   Marital status: Single    Spouse name: Not on file   Number of children: Not on file   Years of education: Not on file   Highest education level: Not on file  Occupational History   Not on file  Tobacco Use   Smoking status: Never   Smokeless tobacco: Never  Vaping Use   Vaping status: Not on file  Substance and Sexual Activity   Alcohol use: Yes    Comment: drinks a 5th a day   Drug use: Not Currently   Sexual activity: Yes  Other Topics Concern   Not on file  Social History Narrative   ** Merged History Encounter **       Social Drivers of Health   Financial Resource Strain: Not on file  Food Insecurity: No Food Insecurity (04/15/2023)   Hunger Vital Sign    Worried About Running Out of Food in the Last Year: Never true    Ran Out of Food in the Last Year: Never true  Transportation Needs: No Transportation Needs (04/15/2023)   PRAPARE - Administrator, Civil Service (Medical): No    Lack of Transportation (Non-Medical): No  Physical Activity: Not on file  Stress: Not on file  Social Connections: Not on file  Intimate Partner Violence: Not At Risk (04/15/2023)   Humiliation, Afraid, Rape, and Kick questionnaire    Fear of Current or Ex-Partner: No    Emotionally Abused: No    Physically Abused: No    Sexually Abused: No    History reviewed. No pertinent family history.   Vitals:   04/17/23 1810 04/17/23 2035 04/18/23 0652 04/18/23 0845  BP: (!) 131/94 (!) 132/97 (!) 129/91 (!) 136/92  Pulse: 76 82 72 70  Resp:  18  16  Temp: 97.7 F (36.5 C) 98 F (36.7 C)  97.9 F (36.6 C)  TempSrc: Oral Oral  Oral  SpO2: 99% 98% 95% 96%  Weight:      Height:        PHYSICAL EXAM General: Well-appearing male, well nourished, in no acute distress. HEENT: Normocephalic and atraumatic. Neck: No JVD.  Lungs: Normal respiratory effort on room air. Clear bilaterally to auscultation. No  wheezes, crackles, rhonchi.  Heart: HRRR. + Murmur.  Abdomen: Non-distended appearing.  Msk: Normal strength and tone for age. Extremities: Warm and well perfused. No clubbing, cyanosis.  No edema.  Neuro: Alert and oriented X 3. Psych: Answers questions appropriately.   Labs: Basic Metabolic Panel: Recent Labs    04/17/23 0517 04/18/23 0544  NA 134* 134*  K 3.4*  4.7  CL 96* 100  CO2 27 29  GLUCOSE 120* 114*  BUN 15 14  CREATININE 0.85 0.96  CALCIUM 8.1* 8.5*  MG 1.4* 1.5*   Liver Function Tests: Recent Labs    04/17/23 0517  AST 110*  ALT 61*  ALKPHOS 45  BILITOT 1.3*  PROT 6.8  ALBUMIN 3.9   No results for input(s): "LIPASE", "AMYLASE" in the last 72 hours. CBC: Recent Labs    04/18/23 0544  WBC 3.4*  HGB 13.8  HCT 39.4  MCV 85.7  PLT 90*   Cardiac Enzymes: No results for input(s): "CKTOTAL", "CKMB", "CKMBINDEX", "TROPONINIHS" in the last 72 hours.  BNP: No results for input(s): "BNP" in the last 72 hours. D-Dimer: No results for input(s): "DDIMER" in the last 72 hours. Hemoglobin A1C: No results for input(s): "HGBA1C" in the last 72 hours. Fasting Lipid Panel: No results for input(s): "CHOL", "HDL", "LDLCALC", "TRIG", "CHOLHDL", "LDLDIRECT" in the last 72 hours. Thyroid Function Tests: No results for input(s): "TSH", "T4TOTAL", "T3FREE", "THYROIDAB" in the last 72 hours.  Invalid input(s): "FREET3" Anemia Panel: No results for input(s): "VITAMINB12", "FOLATE", "FERRITIN", "TIBC", "IRON", "RETICCTPCT" in the last 72 hours.   Radiology: ECHOCARDIOGRAM COMPLETE Result Date: 04/17/2023    ECHOCARDIOGRAM REPORT   Patient Name:   DUWAN ADRIAN Date of Exam: 04/17/2023 Medical Rec #:  [id number]          Height:       73.0 in Accession #:    [id number]         Weight:       225.1 lb Date of Birth:  06-05-1970           BSA:          2.262 m Patient Age:    52 years           BP:           112/81 mmHg Patient Gender: M                  HR:           71  bpm. Exam Location:  [facility] Procedure: 2D Echo, Cardiac Doppler, Color Doppler and Strain Analysis (Both            Spectral and Color Flow Doppler were utilized during procedure). Indications:     Aortic valve disorder  History:         Patient has no prior history of Echocardiogram examinations.                  Arrythmias:Atrial Fibrillation, Signs/Symptoms:Fever; Risk                  Factors:Hypertension and Dyslipidemia. 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 Aortic root and Ascending Aorta repair                  Procedure date 03/16/2020 at [facility].  Sonographer:     Mikki Harbor Referring Phys:  [id number] Delmon Andrada Diagnosing Phys: Windell Norfolk  Sonographer Comments: Global longitudinal strain was attempted. IMPRESSIONS  1. Left ventricular ejection fraction, by estimation, is 55 to 60%. The left ventricle has normal function. The left ventricle has no regional wall motion abnormalities. There is moderate left ventricular hypertrophy. Left ventricular diastolic parameters were normal.  2. Right ventricular systolic function is normal. The right ventricular size is  normal.  3. The mitral valve is normal in structure. Trivial mitral valve regurgitation.  4. Bioprosthetic aortic valve well seated with normal function. Aortic valve regurgitation is not visualized. No aortic stenosis is present. FINDINGS  Left Ventricle: Left ventricular ejection fraction, by estimation, is 55 to 60%. The left ventricle has normal function. The left ventricle has no regional wall motion abnormalities. The left ventricular internal cavity size was normal in size. There is  moderate left ventricular hypertrophy. Left ventricular diastolic parameters were normal. Right Ventricle: The right ventricular size is normal. No increase in right ventricular wall thickness. Right ventricular systolic function is normal. Left Atrium: Left atrial size was normal in size. Right Atrium: Right atrial size was  normal in size. Pericardium: There is no evidence of pericardial effusion. Mitral Valve: The mitral valve is normal in structure. Trivial mitral valve regurgitation. MV peak gradient, 1.6 mmHg. The mean mitral valve gradient is 1.0 mmHg. Tricuspid Valve: The tricuspid valve is normal in structure. Tricuspid valve regurgitation is not demonstrated. Aortic Valve: Bioprosthetic aortic valve well seated with normal function. Aortic valve regurgitation is not visualized. No aortic stenosis is present. Aortic valve mean gradient measures 6.0 mmHg. Aortic valve peak gradient measures 10.0 mmHg. Aortic valve area, by VTI measures 2.24 cm. There is a bioprosthetic valve present in the aortic position. Pulmonic Valve: The pulmonic valve was not well visualized. Pulmonic valve regurgitation is not visualized. Aorta: The aortic root and ascending aorta are structurally normal, with no evidence of dilitation. IAS/Shunts: The atrial septum is grossly normal.  LEFT VENTRICLE PLAX 2D LVIDd:         4.85 cm     Diastology LVIDs:         3.10 cm     LV e' medial:    7.94 cm/s LV PW:         1.50 cm     LV E/e' medial:  7.1 LV IVS:        1.50 cm     LV e' lateral:   12.60 cm/s LVOT diam:     2.00 cm     LV E/e' lateral: 4.5 LV SV:         76 LV SV Index:   34 LVOT Area:     3.14 cm  LV Volumes (MOD) LV vol d, MOD A2C: 64.6 ml LV vol d, MOD A4C: 88.4 ml LV vol s, MOD A2C: 31.4 ml LV vol s, MOD A4C: 47.6 ml LV SV MOD A2C:     33.2 ml LV SV MOD A4C:     88.4 ml LV SV MOD BP:      35.7 ml RIGHT VENTRICLE RV Basal diam:  4.10 cm RV Mid diam:    3.40 cm RV S prime:     8.38 cm/s LEFT ATRIUM             Index        RIGHT ATRIUM           Index LA diam:        4.60 cm 2.03 cm/m   RA Area:     21.00 cm LA Vol (A2C):   68.3 ml 30.19 ml/m  RA Volume:   64.00 ml  28.29 ml/m LA Vol (A4C):   37.3 ml 16.49 ml/m LA Biplane Vol: 51.4 ml 22.72 ml/m  AORTIC VALVE                     PULMONIC VALVE  AV Area (Vmax):    2.15 cm      PV Vmax:        0.82 m/s AV Area (Vmean):   2.13 cm      PV Peak grad:  2.7 mmHg AV Area (VTI):     2.24 cm AV Vmax:           158.00 cm/s AV Vmean:          110.000 cm/s AV VTI:            0.341 m AV Peak Grad:      10.0 mmHg AV Mean Grad:      6.0 mmHg LVOT Vmax:         108.00 cm/s LVOT Vmean:        74.700 cm/s LVOT VTI:          0.243 m LVOT/AV VTI ratio: 0.71  AORTA Ao Root diam: 3.40 cm Ao Asc diam:  3.20 cm MITRAL VALVE MV Area (PHT): 3.66 cm    SHUNTS MV Area VTI:   4.44 cm    Systemic VTI:  0.24 m MV Peak grad:  1.6 mmHg    Systemic Diam: 2.00 cm MV Mean grad:  1.0 mmHg MV Vmax:       0.64 m/s MV Vmean:      36.4 cm/s MV Decel Time: 207 msec MV E velocity: 56.30 cm/s MV A velocity: 56.80 cm/s MV E/A ratio:  0.99 Mellody Drown Alluri Electronically signed by Windell Norfolk Signature Date/Time: 04/17/2023/6:26:26 PM    Final    CT HEAD WO CONTRAST ( ) Result Date: 04/15/2023 CLINICAL DATA:  Mental status change EXAM: CT HEAD WITHOUT CONTRAST TECHNIQUE: Contiguous axial images were obtained from the base of the skull through the vertex without intravenous contrast. RADIATION DOSE REDUCTION: This exam was performed according to the departmental dose-optimization program which includes automated exposure control, adjustment of the mA and/or kV according to patient size and/or use of iterative reconstruction technique. COMPARISON:  04/11/2023 FINDINGS: Brain: No acute intracranial hemorrhage. No CT evidence of acute infarct. Nonspecific hypoattenuation in the periventricular and subcortical white matter favored to reflect chronic microvascular ischemic changes. 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 No edema, mass effect, or midline shift. The basilar cisterns are patent. Ventricles: Prominence of the ventricles suggesting underlying parenchymal volume loss. Vascular: No hyperdense vessel or unexpected calcification. Skull: No acute or aggressive finding. Orbits: The orbits are symmetric. Sinuses: Mild mucosal  thickening in the ethmoid sinuses. Other: Focal soft tissue swelling over the right forehead and right supraorbital ridge. Mastoid air cells are clear. IMPRESSION: No CT evidence of acute intracranial abnormality. Small right forehead hematoma is slightly decreased from prior. Chronic changes as above. 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 Electronically Signed   By: Emily Filbert M.D.   On: 04/15/2023 12:53   DG Lumbar Spine Complete Result Date: 04/12/2023 CLINICAL DATA:  Status post fall. EXAM: LUMBAR SPINE - COMPLETE 4+ VIEW COMPARISON:  November 04, 2021 FINDINGS: There is no evidence of an acute lumbar spine fracture. A chronic compression fracture deformity and subsequent vertebroplasty is seen at the level of T12. Additional mild chronic and degenerative changes are seen along the superior endplate of the L4 vertebral body. Alignment is normal. Intervertebral disc spaces are maintained. IMPRESSION: 1. 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 2. No acute lumbar spine fracture. Electronically Signed   By: Aram Candela M.D.   On: 04/12/2023 04:17  CT Head Wo Contrast Result Date: 04/11/2023 CLINICAL DATA:  Level 1 fall.  On blood thinners. EXAM: CT HEAD WITHOUT CONTRAST CT MAXILLOFACIAL WITHOUT CONTRAST CT CERVICAL SPINE WITHOUT CONTRAST TECHNIQUE: Multidetector CT imaging of the head, cervical spine, and maxillofacial structures were performed using the standard protocol without intravenous contrast. Multiplanar CT image reconstructions of the cervical spine and maxillofacial structures were also generated. RADIATION DOSE REDUCTION: This exam was performed according to the departmental dose-optimization program which includes automated exposure control, adjustment of the mA and/or kV according to patient size and/or use of iterative reconstruction technique. COMPARISON:  CT head 03/03/2022 and CT cervical spine 11/04/2021 FINDINGS: CT HEAD  FINDINGS Brain: No evidence of acute infarction, hemorrhage, hydrocephalus, extra-axial collection or mass lesion/mass effect. Vascular: No hyperdense vessel or unexpected calcification. Skull: Normal. Negative for fracture or focal lesion. Right forehead hematoma. Other: None. CT MAXILLOFACIAL FINDINGS Osseous: No fracture or mandibular dislocation. No destructive process. Orbits: Negative. No traumatic or inflammatory finding. Sinuses: Mild mucosal thickening in the ethmoid air cells and maxillary sinuses. No acute abnormality. Soft tissues: Negative. CT CERVICAL SPINE FINDINGS Alignment: No evidence of traumatic listhesis. Skull base and vertebrae: No acute fracture. Soft tissues and spinal canal: No prevertebral fluid or swelling. No visible canal hematoma. Disc levels: Mild multilevel spondylosis. No significant spinal canal narrowing. Upper chest: No acute abnormality. Other: None. IMPRESSION: 1. No acute intracranial abnormality. Right forehead hematoma. No calvarial fracture. 2. No acute facial fracture. 3. No acute fracture in the cervical spine. Level 1 trauma results were called by telephone at the time of interpretation on 04/11/2023 at 11:52 pm to provider [facility] , who verbally acknowledged these results. Electronically Signed   By: Minerva Fester M.D.   On: 04/11/2023 23:52   CT Cervical Spine Wo Contrast Result Date: 04/11/2023 CLINICAL DATA:  Level 1 fall.  On blood thinners. EXAM: CT HEAD WITHOUT CONTRAST CT MAXILLOFACIAL WITHOUT CONTRAST CT CERVICAL SPINE WITHOUT CONTRAST TECHNIQUE: Multidetector CT imaging of the head, cervical spine, and maxillofacial structures were performed using the standard protocol without intravenous contrast. Multiplanar CT image reconstructions of the cervical spine and maxillofacial structures were also generated. RADIATION DOSE REDUCTION: This exam was performed according to the departmental dose-optimization program which includes automated exposure  control, adjustment of the mA and/or kV according to patient size and/or use of iterative reconstruction technique. COMPARISON:  CT head 03/03/2022 and CT cervical spine 11/04/2021 FINDINGS: CT HEAD FINDINGS Brain: No evidence of acute infarction, hemorrhage, hydrocephalus, extra-axial collection or mass lesion/mass effect. Vascular: No hyperdense vessel or unexpected calcification. Skull: Normal. Negative for fracture or focal lesion. Right forehead hematoma. Other: None. CT MAXILLOFACIAL FINDINGS Osseous: No fracture or mandibular dislocation. No destructive process. Orbits: Negative. No traumatic or inflammatory finding. Sinuses: Mild mucosal thickening in the ethmoid air cells and maxillary sinuses. No acute abnormality. Soft tissues: Negative. CT CERVICAL SPINE FINDINGS Alignment: No evidence of traumatic listhesis. Skull base and vertebrae: No acute fracture. Soft tissues and spinal canal: No prevertebral fluid or swelling. No visible canal hematoma. Disc levels: Mild multilevel spondylosis. No significant spinal canal narrowing. Upper chest: No acute abnormality. Other: None. IMPRESSION: 1. No acute intracranial abnormality. Right forehead hematoma. No calvarial fracture. 2. No acute facial fracture. 3. No acute fracture in the cervical spine. Level 1 trauma results were called by telephone at the time of interpretation on 04/11/2023 at 11:52 pm to provider [facility] , who verbally acknowledged these results. Electronically Signed   By: Angelique Holm.D.  On: 04/11/2023 23:52   CT Maxillofacial Wo Contrast Result Date: 04/11/2023 CLINICAL DATA:  Level 1 fall.  On blood thinners. EXAM: CT HEAD WITHOUT CONTRAST CT MAXILLOFACIAL WITHOUT CONTRAST CT CERVICAL SPINE WITHOUT CONTRAST TECHNIQUE: Multidetector CT imaging of the head, cervical spine, and maxillofacial structures were performed using the standard protocol without intravenous contrast. Multiplanar CT image reconstructions of the cervical  spine and maxillofacial structures were also generated. RADIATION DOSE REDUCTION: This exam was performed according to the departmental dose-optimization program which includes automated exposure control, adjustment of the mA and/or kV according to patient size and/or use of iterative reconstruction technique. COMPARISON:  CT head 03/03/2022 and CT cervical spine 11/04/2021 FINDINGS: CT HEAD FINDINGS Brain: No evidence of acute infarction, hemorrhage, hydrocephalus, extra-axial collection or mass lesion/mass effect. Vascular: No hyperdense vessel or unexpected calcification. Skull: Normal. Negative for fracture or focal lesion. Right forehead hematoma. Other: None. CT MAXILLOFACIAL FINDINGS Osseous: No fracture or mandibular dislocation. No destructive process. Orbits: Negative. No traumatic or inflammatory finding. Sinuses: Mild mucosal thickening in the ethmoid air cells and maxillary sinuses. No acute abnormality. Soft tissues: Negative. CT CERVICAL SPINE FINDINGS Alignment: No evidence of traumatic listhesis. Skull base and vertebrae: No acute fracture. Soft tissues and spinal canal: No prevertebral fluid or swelling. No visible canal hematoma. Disc levels: Mild multilevel spondylosis. No significant spinal canal narrowing. Upper chest: No acute abnormality. Other: None. IMPRESSION: 1. No acute intracranial abnormality. Right forehead hematoma. No calvarial fracture. 2. No acute facial fracture. 3. No acute fracture in the cervical spine. Level 1 trauma results were called by telephone at the time of interpretation on 04/11/2023 at 11:52 pm to provider [facility] , who verbally acknowledged these results. Electronically Signed   By: Minerva Fester M.D.   On: 04/11/2023 23:52   DG Chest Port 1 View Result Date: 04/11/2023 CLINICAL DATA:  Larey Seat, intoxicated EXAM: PORTABLE CHEST 1 VIEW COMPARISON:  03/03/2022 FINDINGS: Single frontal view of the chest demonstrates stable postsurgical changes from median  sternotomy and aortic valve replacement. The cardiac silhouette is unremarkable. No acute airspace disease, effusion, or pneumothorax. No acute bony abnormalities. IMPRESSION: 1. No acute intrathoracic process. Electronically Signed   By: Sharlet Salina M.D.   On: 04/11/2023 23:48    ECHO as above  TELEMETRY reviewed by me 04/18/2023: Sinus rhythm rate 80s  EKG reviewed by me: Sinus rhythm rate 66 bpm, there have been no EKGs this admission with atrial fibrillation  Data reviewed by me 04/18/2023: last 24h vitals tele labs imaging I/O hospitalist progress note  Principal Problem:   Alcohol withdrawal (HCC) Active Problems:   Alcoholic hepatitis without ascites   A-fib (HCC)    ASSESSMENT AND PLAN:  Brian [name] is a 53 y.o. male  with a past medical history of ascending aortic aneurysm s/p aortic root repair and bioprosthetic AVR 02/2020, paroxysmal atrial fibrillation, asthma, anxiety/depression, seizure disorder who presented to the ED on 04/15/2023 for dizziness, frequent falls. Cardiology was consulted for further evaluation.   # Aortic aneurysm s/p aortic root repair and bioprosthetic AVR (02/2020) # Paroxysmal atrial fibrillation # Frequent Falls # Intermittent hypotension Patient presents to the ED due to dizziness and frequent falls. Patient reports history of AF, however there has been no sign of AF since this admission. Na 138, K 3.2, Mg 1.4. LFTs elevated. Troponin are minimally elevated and flat (23 > 18). Patient is in sinus rhythm with stable HR. BP is borderline. Cr slightly up on presentation, now improved.  Echo this  admission with preserved EF. - Continue Eliquis 5 mg BID for stroke risk prevention.  - Continue Coreg 6.25 mg BID.  Would defer resuming ARB on discharge. - Can resume Amio, atorvastatin on discharge. - Continue to replenish electrolytes  -Recommend patient follow-up with his primary cardiologist for holter monitor placement after discharge.   Ok for  discharge today from a cardiac perspective.  Patient to follow-up with his primary cardiologist Dr. Allena Katz with [facility] in 1 to 2 weeks.  This patient's plan of care was discussed and created with Dr. Corky Sing and he is in agreement.  Signed: Gale Journey, PA-C  04/18/2023, 9:14 AM [facility]

## 2023-04-18 NOTE — Discharge Summary (Signed)
 Physician Discharge Summary   Patient: Brian [name] MRN: [id number] DOB: 08/21/1970  Admit date:     04/15/2023  Discharge date: {dischdate:26783}  Discharge Physician: Marcelino Duster   PCP: Elayne Snare, MD   Recommendations at discharge:  {Tip this will not be part of the note when signed- Example include specific recommendations for outpatient follow-up, pending tests to follow-up on. (Optional):26781}  ***  Discharge Diagnoses: Principal Problem:   Alcohol withdrawal (HCC) Active Problems:   Alcoholic hepatitis without ascites   A-fib (HCC)  Resolved Problems:   * No resolved hospital problems. [facility] Course: No notes on file  Assessment and Plan: No notes have been filed under this hospital service. Service: Hospitalist     {Tip this will not be part of the note when signed Body mass index is 29.7 kg/m. , ,  (Optional):26781}  {(NOTE) Pain control PDMP Statment (Optional):26782} Consultants: *** Procedures performed: ***  Disposition: {Plan; Disposition:26390} Diet recommendation:  Discharge Diet Orders (From admission, onward)     Start     Ordered   04/18/23 0000  Diet - low sodium heart healthy        04/18/23 1049           {Diet_Plan:26776} DISCHARGE MEDICATION: Allergies as of 04/18/2023       Reactions   Metoprolol Anaphylaxis   AFFECTED MOTOR SKILLS Tolerated coreg 03/02/20   Lithium Other (See Comments)   Emotional issues admitted after taking   Valproic Acid Other (See Comments)   hallucinations   Depakote [divalproex Sodium]    psychosis   Penicillins Hives   Depakote [divalproex Sodium] Anxiety   Nsaids Other (See Comments)   Gastritis Contraindicated due to Eliquis and Creon   Penicillins Rash   Tolerated Ancef February 2022   Sulfa Antibiotics Rash        Medication List     STOP taking these medications    losartan 50 MG tablet Commonly known as: COZAAR   oxyCODONE 5 MG immediate release  tablet Commonly known as: Oxy IR/ROXICODONE   oxyCODONE-acetaminophen 5-325 MG tablet Commonly known as: PERCOCET/ROXICET       TAKE these medications    albuterol 108 (90 Base) MCG/ACT inhaler Commonly known as: VENTOLIN HFA Inhale 1 puff into the lungs every 4 (four) hours as needed for wheezing or shortness of breath.   amiodarone 400 MG tablet Commonly known as: PACERONE TAKE 1 TABLET(400 MG) BY MOUTH TWICE DAILY What changed: See the new instructions.   atorvastatin 20 MG tablet Commonly known as: LIPITOR Take 20 mg by mouth daily.   carvedilol 6.25 MG tablet Commonly known as: COREG Take 1 tablet (6.25 mg total) by mouth 2 (two) times daily. What changed:  medication strength how much to take Another medication with the same name was removed. Continue taking this medication, and follow the directions you see here.   clonazePAM 1 MG tablet Commonly known as: KLONOPIN Take 1 tablet by mouth 3 (three) times daily as needed.   Creon 36000 UNITS Cpep capsule Generic drug: lipase/protease/amylase Take 1 capsule by mouth. 2 cap qam and 2 cap at noon , 2 cap qpm and 1 cap qhs = 7 caps a day   Eliquis 2.5 MG Tabs tablet Generic drug: apixaban Take 5 mg by mouth 2 (two) times daily.   fenofibrate 145 MG tablet Commonly known as: TRICOR Take 145 mg by mouth daily.   Focalin XR 20 MG 24 hr capsule Generic drug: dexmethylphenidate Take 20  mg by mouth every evening.   Focalin XR 30 MG Cp24 Generic drug: Dexmethylphenidate HCl Take 1 capsule by mouth every morning.   folic acid 1 MG tablet Commonly known as: FOLVITE Take 1 tablet (1 mg total) by mouth daily. Start taking on: April 19, 2023   furosemide 20 MG tablet Commonly known as: LASIX Take 20 mg by mouth daily.   magnesium oxide 400 (240 Mg) MG tablet Commonly known as: MAG-OX Take 1 tablet (400 mg total) by mouth daily.   multivitamin with minerals Tabs tablet Take 1 tablet by mouth daily. Start  taking on: April 19, 2023   ondansetron 8 MG disintegrating tablet Commonly known as: ZOFRAN-ODT Take 8 mg by mouth 2 (two) times daily.   polyethylene glycol 17 g packet Commonly known as: MIRALAX / GLYCOLAX Take 1 packet by mouth daily.   sertraline 100 MG tablet Commonly known as: ZOLOFT Take 200 mg by mouth daily.   Symbicort 80-4.5 MCG/ACT inhaler Generic drug: budesonide-formoterol Inhale 2 puffs into the lungs at bedtime as needed.   tadalafil 5 MG tablet Commonly known as: CIALIS Take 1 tablet (5 mg total) by mouth daily as needed for erectile dysfunction.   thiamine 100 MG tablet Commonly known as: Vitamin B-1 Take 1 tablet (100 mg total) by mouth daily. Start taking on: April 19, 2023        Follow-up Information     Dr. Allena Katz. Go in 1 week(s).   Why: Patient reports he will call and schedule follow up appointment with his cardiologist Contact information: [facility]        Willeen Cass Myna Bright, MD Follow up in 1 week(s).   Specialty: Obstetrics Contact information: [name] [place] [address] [phone]                Discharge Exam: Ceasar Mons Weights   04/15/23 1600  Weight: 102.1 kg   ***  Condition at discharge: {DC Condition:26389}  The results of significant diagnostics from this hospitalization (including imaging, microbiology, ancillary and laboratory) are listed below for reference.   Imaging Studies: ECHOCARDIOGRAM COMPLETE Result Date: 04/17/2023    ECHOCARDIOGRAM REPORT   Patient Name:   Brian [name] Date of Exam: 04/17/2023 Medical Rec #:  [id number]          Height:       73.0 in Accession #:    [id number]         Weight:       225.1 lb Date of Birth:  [birth year]           BSA:          2.262 m Patient Age:    52 years           BP:           112/81 mmHg Patient Gender: M                  HR:           71 bpm. Exam Location:  [facility] Procedure: 2D Echo, Cardiac Doppler, Color Doppler and Strain  Analysis (Both            Spectral and Color Flow Doppler were utilized during procedure). Indications:     Aortic valve disorder  History:         Patient has no prior history of Echocardiogram examinations.  Arrythmias:Atrial Fibrillation, Signs/Symptoms:Fever; Risk                  Factors:Hypertension and Dyslipidemia. 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  Sonographer:     Mikki Harbor Referring Phys:  [id number] CARALYN HUDSON Diagnosing Phys: Windell Norfolk  Sonographer Comments: Global longitudinal strain was attempted. IMPRESSIONS  1. Left ventricular ejection fraction, by estimation, is 55 to 60%. The left ventricle has normal function. The left ventricle has no regional wall motion abnormalities. There is moderate left ventricular hypertrophy. Left ventricular diastolic parameters were normal.  2. Right ventricular systolic function is normal. The right ventricular size is normal.  3. The mitral valve is normal in structure. Trivial mitral valve regurgitation.  4. Bioprosthetic aortic valve well seated with normal function. Aortic valve regurgitation is not visualized. No aortic stenosis is present. FINDINGS  Left Ventricle: Left ventricular ejection fraction, by estimation, is 55 to 60%. The left ventricle has normal function. The left ventricle has no regional wall motion abnormalities. The left ventricular internal cavity size was normal in size. There is  moderate left ventricular hypertrophy. Left ventricular diastolic parameters were normal. Right Ventricle: The right ventricular size is normal. No increase in right ventricular wall thickness. Right ventricular systolic function is normal. Left Atrium: Left atrial size was normal in size. Right Atrium: Right atrial size was normal in size. Pericardium: There is no evidence of pericardial effusion. Mitral  Valve: The mitral valve is normal in structure. Trivial mitral valve regurgitation. MV peak gradient, 1.6 mmHg. The mean mitral valve gradient is 1.0 mmHg. Tricuspid Valve: The tricuspid valve is normal in structure. Tricuspid valve regurgitation is not demonstrated. Aortic Valve: Bioprosthetic aortic valve well seated with normal function. Aortic valve regurgitation is not visualized. No aortic stenosis is present. Aortic valve mean gradient measures 6.0 mmHg. Aortic valve peak gradient measures 10.0 mmHg. Aortic valve area, by VTI measures 2.24 cm. There is a bioprosthetic valve present in the aortic position. Pulmonic Valve: The pulmonic valve was not well visualized. Pulmonic valve regurgitation is not visualized. Aorta: The aortic root and ascending aorta are structurally normal, with no evidence of dilitation. IAS/Shunts: The atrial septum is grossly normal.  LEFT VENTRICLE PLAX 2D LVIDd:         4.85 cm     Diastology LVIDs:         3.10 cm     LV e' medial:    7.94 cm/s LV PW:         1.50 cm     LV E/e' medial:  7.1 LV IVS:        1.50 cm     LV e' lateral:   12.60 cm/s LVOT diam:     2.00 cm     LV E/e' lateral: 4.5 LV SV:         76 LV SV Index:   34 LVOT Area:     3.14 cm  LV Volumes (MOD) LV vol d, MOD A2C: 64.6 ml LV vol d, MOD A4C: 88.4 ml LV vol s, MOD A2C: 31.4 ml LV vol s, MOD A4C: 47.6 ml LV SV MOD A2C:     33.2 ml LV SV MOD A4C:  88.4 ml LV SV MOD BP:      35.7 ml RIGHT VENTRICLE RV Basal diam:  4.10 cm RV Mid diam:    3.40 cm RV S prime:     8.38 cm/s LEFT ATRIUM             Index        RIGHT ATRIUM           Index LA diam:        4.60 cm 2.03 cm/m   RA Area:     21.00 cm LA Vol (A2C):   68.3 ml 30.19 ml/m  RA Volume:   64.00 ml  28.29 ml/m LA Vol (A4C):   37.3 ml 16.49 ml/m LA Biplane Vol: 51.4 ml 22.72 ml/m  AORTIC VALVE                     PULMONIC VALVE AV Area (Vmax):    2.15 cm      PV Vmax:       0.82 m/s AV Area (Vmean):   2.13 cm      PV Peak grad:  2.7 mmHg AV Area (VTI):      2.24 cm AV Vmax:           158.00 cm/s AV Vmean:          110.000 cm/s AV VTI:            0.341 m AV Peak Grad:      10.0 mmHg AV Mean Grad:      6.0 mmHg LVOT Vmax:         108.00 cm/s LVOT Vmean:        74.700 cm/s LVOT VTI:          0.243 m LVOT/AV VTI ratio: 0.71  AORTA Ao Root diam: 3.40 cm Ao Asc diam:  3.20 cm MITRAL VALVE MV Area (PHT): 3.66 cm    SHUNTS MV Area VTI:   4.44 cm    Systemic VTI:  0.24 m MV Peak grad:  1.6 mmHg    Systemic Diam: 2.00 cm MV Mean grad:  1.0 mmHg MV Vmax:       0.64 m/s MV Vmean:      36.4 cm/s MV Decel Time: 207 msec MV E velocity: 56.30 cm/s MV A velocity: 56.80 cm/s MV E/A ratio:  0.99 Mellody Drown Alluri Electronically signed by Windell Norfolk Signature Date/Time: 04/17/2023/6:26:26 PM    Final    CT HEAD WO CONTRAST ( ) Result Date: 04/15/2023 CLINICAL DATA:  Mental status change EXAM: CT HEAD WITHOUT CONTRAST TECHNIQUE: Contiguous axial images were obtained from the base of the skull through the vertex without intravenous contrast. RADIATION DOSE REDUCTION: This exam was performed according to the departmental dose-optimization program which includes automated exposure control, adjustment of the mA and/or kV according to patient size and/or use of iterative reconstruction technique. COMPARISON:  04/11/2023 FINDINGS: Brain: No acute intracranial hemorrhage. No CT evidence of acute infarct. Nonspecific hypoattenuation in the periventricular and subcortical white matter favored to reflect chronic microvascular ischemic changes. 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 No edema, mass effect, or midline shift. The basilar cisterns are patent. Ventricles: Prominence of the ventricles suggesting underlying parenchymal volume loss. Vascular: No hyperdense vessel or unexpected calcification. Skull: No acute or aggressive finding. Orbits: The orbits are symmetric. Sinuses: Mild mucosal thickening in the ethmoid sinuses. Other: Focal soft tissue swelling over the right  forehead and right supraorbital ridge. Mastoid air cells are clear.  IMPRESSION: No CT evidence of acute intracranial abnormality. Small right forehead hematoma is slightly decreased from prior. Chronic changes as above. 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 Electronically Signed   By: Emily Filbert M.D.   On: 04/15/2023 12:53   DG Lumbar Spine Complete Result Date: 04/12/2023 CLINICAL DATA:  Status post fall. EXAM: LUMBAR SPINE - COMPLETE 4+ VIEW COMPARISON:  November 04, 2021 FINDINGS: There is no evidence of an acute lumbar spine fracture. A chronic compression fracture deformity and subsequent vertebroplasty is seen at the level of T12. Additional mild chronic and degenerative changes are seen along the superior endplate of the L4 vertebral body. Alignment is normal. Intervertebral disc spaces are maintained. IMPRESSION: 1. 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 2. No acute lumbar spine fracture. Electronically Signed   By: Aram Candela M.D.   On: 04/12/2023 04:17   CT Head Wo Contrast Result Date: 04/11/2023 CLINICAL DATA:  Level 1 fall.  On blood thinners. EXAM: CT HEAD WITHOUT CONTRAST CT MAXILLOFACIAL WITHOUT CONTRAST CT CERVICAL SPINE WITHOUT CONTRAST TECHNIQUE: Multidetector CT imaging of the head, cervical spine, and maxillofacial structures were performed using the standard protocol without intravenous contrast. Multiplanar CT image reconstructions of the cervical spine and maxillofacial structures were also generated. RADIATION DOSE REDUCTION: This exam was performed according to the departmental dose-optimization program which includes automated exposure control, adjustment of the mA and/or kV according to patient size and/or use of iterative reconstruction technique. COMPARISON:  CT head 03/03/2022 and CT cervical spine 11/04/2021 FINDINGS: CT HEAD FINDINGS Brain: No evidence of acute infarction, hemorrhage, hydrocephalus,  extra-axial collection or mass lesion/mass effect. Vascular: No hyperdense vessel or unexpected calcification. Skull: Normal. Negative for fracture or focal lesion. Right forehead hematoma. Other: None. CT MAXILLOFACIAL FINDINGS Osseous: No fracture or mandibular dislocation. No destructive process. Orbits: Negative. No traumatic or inflammatory finding. Sinuses: Mild mucosal thickening in the ethmoid air cells and maxillary sinuses. No acute abnormality. Soft tissues: Negative. CT CERVICAL SPINE FINDINGS Alignment: No evidence of traumatic listhesis. Skull base and vertebrae: No acute fracture. Soft tissues and spinal canal: No prevertebral fluid or swelling. No visible canal hematoma. Disc levels: Mild multilevel spondylosis. No significant spinal canal narrowing. Upper chest: No acute abnormality. Other: None. IMPRESSION: 1. No acute intracranial abnormality. Right forehead hematoma. No calvarial fracture. 2. No acute facial fracture. 3. No acute fracture in the cervical spine. Level 1 trauma results were called by telephone at the time of interpretation on 04/11/2023 at 11:52 pm to provider [facility] , who verbally acknowledged these results. Electronically Signed   By: Minerva Fester M.D.   On: 04/11/2023 23:52   CT Cervical Spine Wo Contrast Result Date: 04/11/2023 CLINICAL DATA:  Level 1 fall.  On blood thinners. EXAM: CT HEAD WITHOUT CONTRAST CT MAXILLOFACIAL WITHOUT CONTRAST CT CERVICAL SPINE WITHOUT CONTRAST TECHNIQUE: Multidetector CT imaging of the head, cervical spine, and maxillofacial structures were performed using the standard protocol without intravenous contrast. Multiplanar CT image reconstructions of the cervical spine and maxillofacial structures were also generated. RADIATION DOSE REDUCTION: This exam was performed according to the departmental dose-optimization program which includes automated exposure control, adjustment of the mA and/or kV according to patient size and/or use of  iterative reconstruction technique. COMPARISON:  CT head 03/03/2022 and CT cervical spine 11/04/2021 FINDINGS: CT HEAD FINDINGS Brain: No evidence of acute infarction, hemorrhage, hydrocephalus, extra-axial collection or mass lesion/mass effect. Vascular: No hyperdense vessel or unexpected calcification. Skull: Normal. Negative for  fracture or focal lesion. Right forehead hematoma. Other: None. CT MAXILLOFACIAL FINDINGS Osseous: No fracture or mandibular dislocation. No destructive process. Orbits: Negative. No traumatic or inflammatory finding. Sinuses: Mild mucosal thickening in the ethmoid air cells and maxillary sinuses. No acute abnormality. Soft tissues: Negative. CT CERVICAL SPINE FINDINGS Alignment: No evidence of traumatic listhesis. Skull base and vertebrae: No acute fracture. Soft tissues and spinal canal: No prevertebral fluid or swelling. No visible canal hematoma. Disc levels: Mild multilevel spondylosis. No significant spinal canal narrowing. Upper chest: No acute abnormality. Other: None. IMPRESSION: 1. No acute intracranial abnormality. Right forehead hematoma. No calvarial fracture. 2. No acute facial fracture. 3. No acute fracture in the cervical spine. Level 1 trauma results were called by telephone at the time of interpretation on 04/11/2023 at 11:52 pm to provider [facility] , who verbally acknowledged these results. Electronically Signed   By: Minerva Fester M.D.   On: 04/11/2023 23:52   CT Maxillofacial Wo Contrast Result Date: 04/11/2023 CLINICAL DATA:  Level 1 fall.  On blood thinners. EXAM: CT HEAD WITHOUT CONTRAST CT MAXILLOFACIAL WITHOUT CONTRAST CT CERVICAL SPINE WITHOUT CONTRAST TECHNIQUE: Multidetector CT imaging of the head, cervical spine, and maxillofacial structures were performed using the standard protocol without intravenous contrast. Multiplanar CT image reconstructions of the cervical spine and maxillofacial structures were also generated. RADIATION DOSE REDUCTION:  This exam was performed according to the departmental dose-optimization program which includes automated exposure control, adjustment of the mA and/or kV according to patient size and/or use of iterative reconstruction technique. COMPARISON:  CT head 03/03/2022 and CT cervical spine 11/04/2021 FINDINGS: CT HEAD FINDINGS Brain: No evidence of acute infarction, hemorrhage, hydrocephalus, extra-axial collection or mass lesion/mass effect. Vascular: No hyperdense vessel or unexpected calcification. Skull: Normal. Negative for fracture or focal lesion. Right forehead hematoma. Other: None. CT MAXILLOFACIAL FINDINGS Osseous: No fracture or mandibular dislocation. No destructive process. Orbits: Negative. No traumatic or inflammatory finding. Sinuses: Mild mucosal thickening in the ethmoid air cells and maxillary sinuses. No acute abnormality. Soft tissues: Negative. CT CERVICAL SPINE FINDINGS Alignment: No evidence of traumatic listhesis. Skull base and vertebrae: No acute fracture. Soft tissues and spinal canal: No prevertebral fluid or swelling. No visible canal hematoma. Disc levels: Mild multilevel spondylosis. No significant spinal canal narrowing. Upper chest: No acute abnormality. Other: None. IMPRESSION: 1. No acute intracranial abnormality. Right forehead hematoma. No calvarial fracture. 2. No acute facial fracture. 3. No acute fracture in the cervical spine. Level 1 trauma results were called by telephone at the time of interpretation on 04/11/2023 at 11:52 pm to provider [facility] , who verbally acknowledged these results. Electronically Signed   By: Minerva Fester M.D.   On: 04/11/2023 23:52   DG Chest Port 1 View Result Date: 04/11/2023 CLINICAL DATA:  Larey Seat, intoxicated EXAM: PORTABLE CHEST 1 VIEW COMPARISON:  03/03/2022 FINDINGS: Single frontal view of the chest demonstrates stable postsurgical changes from median sternotomy and aortic valve replacement. The cardiac silhouette is unremarkable. No  acute airspace disease, effusion, or pneumothorax. No acute bony abnormalities. IMPRESSION: 1. No acute intrathoracic process. Electronically Signed   By: Sharlet Salina M.D.   On: 04/11/2023 23:48    Microbiology: Results for orders placed or performed during the hospital encounter of 05/12/22  Chlamydia/NGC rt PCR [facility] only)     Status: None   Collection Time: 05/12/22 11:56 AM   Specimen: Urine  Result Value Ref Range Status   Specimen source GC/Chlam URINE, RANDOM  Final   Chlamydia Tr  NOT DETECTED NOT DETECTED Final   N gonorrhoeae NOT DETECTED NOT DETECTED Final    Comment: (NOTE) This CT/NG assay has not been evaluated in patients with a history of  hysterectomy. Performed at [facility], [address] [place], [place] [address]     Labs: CBC: Recent Labs  Lab 04/11/23 2329 04/11/23 2334 04/15/23 0316 04/18/23 0544  WBC  --  7.1 6.2 3.4*  HGB 16.3 15.4 14.8 13.8  HCT 48.0 45.0 42.0 39.4  MCV  --  88.2 85.4 85.7  PLT  --  148* 149* 90*   Basic Metabolic Panel: Recent Labs  Lab 04/11/23 2334 04/15/23 0316 04/15/23 0514 04/16/23 0351 04/17/23 0517 04/18/23 0544  NA 136 132*  --  138 134* 134*  K 3.3* 3.1*  --  3.2* 3.4* 4.7  CL 96* 92*  --  97* 96* 100  CO2 17* 21*  --  26 27 29   GLUCOSE 101* 91  --  90 120* 114*  BUN 10 13  --  18 15 14   CREATININE 1.46* 1.45*  --  0.99 0.85 0.96  CALCIUM 7.8* 7.8*  --  7.5* 8.1* 8.5*  MG  --   --  1.3* 1.4* 1.4* 1.5*   Liver Function Tests: Recent Labs  Lab 04/11/23 2334 04/15/23 0514 04/17/23 0517  AST 166* 117* 110*  ALT 81* 76* 61*  ALKPHOS 49 49 45  BILITOT 0.9 2.4* 1.3*  PROT 7.2 7.5 6.8  ALBUMIN 4.0 4.3 3.9   CBG: No results for input(s): "GLUCAP" in the last 168 hours.  Discharge time spent: {LESS THAN/GREATER XLKG:40102} 30 minutes.  Signed: Marcelino Duster, MD [facility] 04/18/2023

## 2023-04-18 NOTE — Evaluation (Signed)
 Occupational Therapy Evaluation Patient Details Name: Brian [name] MRN: [id number] DOB: 04/03/1970 Today's Date: 04/18/2023   History of Present Illness   Pt is a  with a past medical history of ascending aortic aneurysm s/p aortic root repair and bioprosthetic AVR 02/2020, paroxysmal atrial fibrillation, asthma, anxiety/depression, seizure disorder who presented to the ED on 04/15/2023 for dizziness, frequent falls     Clinical Impressions Chart reviewed, pt greeted in bed, alert and oriented x4, agreeable to OT evaluation. PTA pt reports he is MOD I-I in ADL/IADL, recently reported trouble with med management. Team notified. Pt is performing ADL grossly at a supervision-MOD I level. Initial supervision as pt reports he hasn't been out of bed much. Vitals taken in multiple position with no significant change, please refer to flow sheet. Pt will benefit from acute OT to facilitate optimal ADL/IADL performance. Pt is left in care of PT in hallway, all needs met.      If plan is discharge home, recommend the following:   Direct supervision/assist for medications management     Functional Status Assessment   Patient has had a recent decline in their functional status and demonstrates the ability to make significant improvements in function in a reasonable and predictable amount of time.     Equipment Recommendations         Recommendations for Other Services         Precautions/Restrictions   Precautions Precautions: Fall Recall of Precautions/Restrictions: Intact     Mobility Bed Mobility Overal bed mobility: Modified Independent                  Transfers Overall transfer level: Needs assistance   Transfers: Sit to/from Stand Sit to Stand: Modified independent (Device/Increase time), Supervision                  Balance Overall balance assessment: Needs assistance Sitting-balance support: Feet supported Sitting balance-Leahy Scale: Good      Standing balance support: No upper extremity supported Standing balance-Leahy Scale: Good                             ADL either performed or assessed with clinical judgement   ADL Overall ADL's : Needs assistance/impaired Eating/Feeding: Set up   Grooming: Supervision/safety;Standing           Upper Body Dressing : Modified independent   Lower Body Dressing: Supervision/safety;Sit to/from stand   Toilet Transfer: Supervision/safety;Ambulation;Regular Toilet   Toileting- Architect and Hygiene: Supervision/safety;Sit to/from stand       Functional mobility during ADLs: Supervision/safety (amb household distances in room, then hand off to PT in hallway)       Vision Baseline Vision/History: 1 Wears glasses Patient Visual Report: No change from baseline       Perception         Praxis         Pertinent Vitals/Pain Pain Assessment Pain Assessment: No/denies pain     Extremity/Trunk Assessment Upper Extremity Assessment Upper Extremity Assessment: Overall WFL for tasks assessed   Lower Extremity Assessment Lower Extremity Assessment: Overall WFL for tasks assessed   Cervical / Trunk Assessment Cervical / Trunk Assessment: Normal   Communication Communication Communication: No apparent difficulties   Cognition Arousal: Alert Behavior During Therapy: WFL for tasks assessed/performed Cognition: No apparent impairments  Following commands: Intact       Cueing  General Comments   Cueing Techniques: Verbal cues  vss throughout, eyebrow laceration noted   Exercises Other Exercises Other Exercises: edu re: role of OT, role of rehab   Shoulder Instructions      Home Living Family/patient expects to be discharged to:: Other (Comment) Living Arrangements: Alone   Type of Home: Apartment (loft)       Home Layout: Two level;Able to live on main level with  bedroom/bathroom Alternate Level Stairs-Number of Steps: flight Alternate Level Stairs-Rails: Right Bathroom Shower/Tub: Chief Strategy Officer: Standard     Home Equipment: Grab bars - tub/shower          Prior Functioning/Environment Prior Level of Function : Independent/Modified Independent;Driving               ADLs Comments: pt reports sometimes he has trouble with med management    OT Problem List: Decreased activity tolerance   OT Treatment/Interventions: Self-care/ADL training;Patient/family education;DME and/or AE instruction      OT Goals(Current goals can be found in the care plan section)   Acute Rehab OT Goals Patient Stated Goal: improve function OT Goal Formulation: With patient Time For Goal Achievement: 05/02/23 Potential to Achieve Goals: Good ADL Goals Pt Will Transfer to Toilet: Independently Additional ADL Goal #1: pt will participate in pill box test in order to facilitate improved IADL performance   OT Frequency:  Min 1X/week    Co-evaluation              AM-PAC OT "6 Clicks" Daily Activity     Outcome Measure Help from another person eating meals?: None Help from another person taking care of personal grooming?: None Help from another person toileting, which includes using toliet, bedpan, or urinal?: None Help from another person bathing (including washing, rinsing, drying)?: None Help from another person to put on and taking off regular upper body clothing?: None Help from another person to put on and taking off regular lower body clothing?: None 6 Click Score: 24   End of Session Equipment Utilized During Treatment: Gait belt Nurse Communication: Mobility status  Activity Tolerance: Patient tolerated treatment well Patient left: Other (comment) (in care of PT in hallway)  OT Visit Diagnosis: Other abnormalities of gait and mobility (R26.89)                Time: 0950-1005 OT Time Calculation (min): 15  min Charges:  OT General Charges $OT Visit: 1 Visit OT Evaluation $OT Eval Low Complexity: 1 Low  Oleta Mouse, OTD OTR/L  04/18/23, 12:41 PM

## 2023-04-18 NOTE — Evaluation (Signed)
 Physical Therapy Evaluation Patient Details Name: Brian [name] MRN: [id number] DOB: 1970/12/10 Today's Date: 04/18/2023  History of Present Illness  Pt is a 53 yo male that presented to ED for nausea, vomiting, unsteady gait, falls. PMH: ADHD, HTN, hx of PE, thoracic aortic aneurysm, hx of seizure, etoh, kyphoplasty.  Clinical Impression  Pt alert, standing with OT, and ambulating to the bathroom. At baseline the pt lives alone, is independent. He was able to transfer and ambulate modI/I. He also perform stair navigation modI with one rail. Pt did have a bout of gait path deviations and one stagger when attempting to tie his gown while walking, but able to correct without assistance. The patient demonstrated and reported return to baseline level of functioning, no further acute PT needs indicated. PT to sign off. Please reconsult PT if pt status changes or acute needs are identified.          If plan is discharge home, recommend the following:  (NA)   Can travel by private vehicle        Equipment Recommendations None recommended by PT  Recommendations for Other Services       Functional Status Assessment Patient has not had a recent decline in their functional status     Precautions / Restrictions Precautions Precautions: Fall Recall of Precautions/Restrictions: Intact Restrictions Weight Bearing Restrictions Per Provider Order: No      Mobility  Bed Mobility               General bed mobility comments: Pt standing with OT upon PT arrival    Transfers Overall transfer level: Modified independent                      Ambulation/Gait Ambulation/Gait assistance: Supervision, Modified independent (Device/Increase time) Gait Distance (Feet): 270 Feet Assistive device: None         General Gait Details: when mulittasking while walking (tying gown in front) noted for some gait path deviations and 1 stumble but did not need physical assistance to  correct  Stairs Stairs: Yes Stairs assistance: Modified independent (Device/Increase time) Stair Management: One rail Right Number of Stairs: 17    Wheelchair Mobility     Tilt Bed    Modified Rankin (Stroke Patients Only)       Balance Overall balance assessment: Mild deficits observed, not formally tested                                           Pertinent Vitals/Pain Pain Assessment Pain Assessment: No/denies pain    Home Living Family/patient expects to be discharged to:: Private residence Living Arrangements: Alone   Type of Home: Apartment (loft)       Alternate Level Stairs-Number of Steps: flight Home Layout: Two level;Able to live on main level with bedroom/bathroom Home Equipment: Grab bars - tub/shower      Prior Function Prior Level of Function : Independent/Modified Independent;Driving               ADLs Comments: pt reports sometimes he has trouble with med management     Extremity/Trunk Assessment   Upper Extremity Assessment Upper Extremity Assessment: Overall WFL for tasks assessed    Lower Extremity Assessment Lower Extremity Assessment: Overall WFL for tasks assessed    Cervical / Trunk Assessment Cervical / Trunk Assessment: Normal  Communication   Communication Communication: No apparent  difficulties    Cognition Arousal: Alert Behavior During Therapy: WFL for tasks assessed/performed                             Following commands: Intact       Cueing Cueing Techniques: Verbal cues     General Comments General comments (skin integrity, edema, etc.): vss throughout, eyebrow laceration noted    Exercises     Assessment/Plan    PT Assessment Patient does not need any further PT services  PT Problem List         PT Treatment Interventions      PT Goals (Current goals can be found in the Care Plan section)       Frequency       Co-evaluation               AM-PAC  PT "6 Clicks" Mobility  Outcome Measure Help needed turning from your back to your side while in a flat bed without using bedrails?: None Help needed moving from lying on your back to sitting on the side of a flat bed without using bedrails?: None Help needed moving to and from a bed to a chair (including a wheelchair)?: None Help needed standing up from a chair using your arms (e.g., wheelchair or bedside chair)?: None Help needed to walk in hospital room?: None Help needed climbing 3-5 steps with a railing? : None 6 Click Score: 24    End of Session   Activity Tolerance: Patient tolerated treatment well Patient left: with chair alarm set;in chair;with call bell/phone within reach Nurse Communication: Mobility status PT Visit Diagnosis: Difficulty in walking, not elsewhere classified (R26.2)    Time: 1610-9604 PT Time Calculation (min) (ACUTE ONLY): 11 min   Charges:   PT Evaluation $PT Eval Low Complexity: 1 Low   PT General Charges $$ ACUTE PT VISIT: 1 Visit         Olga Coaster PT, DPT 1:08 PM,04/18/23

## 2023-04-18 NOTE — Plan of Care (Signed)
   Problem: Coping: Goal: Level of anxiety will decrease Outcome: Progressing   Problem: Pain Managment: Goal: General experience of comfort will improve and/or be controlled Outcome: Progressing   Problem: Safety: Goal: Ability to remain free from injury will improve Outcome: Progressing

## 2023-04-18 NOTE — Progress Notes (Signed)
 PAtient given discharge instructions and verbalizes understanding. PAtient taken out via WC.

## 2023-06-07 ENCOUNTER — Other Ambulatory Visit: Payer: Self-pay | Admitting: Physician Assistant

## 2023-06-07 DIAGNOSIS — N5201 Erectile dysfunction due to arterial insufficiency: Secondary | ICD-10-CM

## 2023-06-23 ENCOUNTER — Other Ambulatory Visit: Payer: Self-pay | Admitting: Physician Assistant

## 2023-06-23 DIAGNOSIS — N5201 Erectile dysfunction due to arterial insufficiency: Secondary | ICD-10-CM

## 2023-06-25 ENCOUNTER — Other Ambulatory Visit: Payer: Self-pay

## 2023-07-24 ENCOUNTER — Ambulatory Visit: Payer: Self-pay | Admitting: Urology

## 2023-07-25 ENCOUNTER — Telehealth: Payer: Self-pay | Admitting: Physician Assistant

## 2023-07-25 NOTE — Telephone Encounter (Signed)
 Patient called back to check status of his refill request and I relayed Shannon's message that there are no refills until he is seen. Patient asked if there were any earlier appointment dates, and I informed him that there were no earlier appts available at this time. I added him to the wait list.

## 2023-07-25 NOTE — Telephone Encounter (Signed)
 Patient called again requesting a call back regarding RX for Sildenafil  20 mg. Please advise patient.

## 2023-07-25 NOTE — Telephone Encounter (Signed)
 Patient called to request refill for Sildenafil  20 mg. Refill was denied by pharmacy. He missed his appointment on 07/24/23 with Clotilda, but has rescheduled for 08/13/23 with Sam. He is asking if he can get refill. Pharmacy is [facility] on [address]. Please advise patient.

## 2023-08-13 ENCOUNTER — Ambulatory Visit: Admitting: Physician Assistant

## 2023-08-13 VITALS — BP 177/111 | HR 108 | Ht 74.0 in | Wt 265.0 lb

## 2023-08-13 DIAGNOSIS — N5201 Erectile dysfunction due to arterial insufficiency: Secondary | ICD-10-CM | POA: Diagnosis not present

## 2023-08-13 DIAGNOSIS — L819 Disorder of pigmentation, unspecified: Secondary | ICD-10-CM

## 2023-08-13 MED ORDER — SILDENAFIL CITRATE 50 MG PO TABS: 50.0000 mg | ORAL_TABLET | Freq: Every day | ORAL | 5 refills | Status: DC | PRN

## 2023-08-13 NOTE — Progress Notes (Unsigned)
08/13/2023 5:15 PM   Brian [name] [birth year] [id number]  CC: Chief Complaint  Patient presents with   Erectile Dysfunction   HPI: Brian [name] is a 53 y.o. male with PMH alcohol abuse, ED on sildenafil , and penile injuries who presents today for sildenafil  refill.   Today he reports hyperpigmentation of his penile shaft not associated with pain.  It is chronic.  Sildenafil  has been working well for his erections, and he typically takes around 40 mg for a dose.  He request 50 mg tablets so he wanted to take as many pills at once when he uses it.  No painful erections or priapism.  PMH: Past Medical History:  Diagnosis Date   A-fib (HCC)    ADD (attention deficit disorder)    ADHD    Alcohol abuse    Anxiety    Anxiety and depression    Asthma    Congenital heart defect    Depression    History of ETOH abuse    Hyperlipidemia    Hypertension    Pulmonary thromboembolism (HCC)    Seizure (HCC)    Thoracic aortic aneurysm [facility])     Surgical History: Past Surgical History:  Procedure Laterality Date   artificial aorta and pig valve  02/25/2020   CARDIAC CATHETERIZATION     CARDIAC SURGERY     CHOLECYSTECTOMY  2004   COLONOSCOPY     DENTAL SURGERY     IR KYPHO THORACIC WITH BONE BIOPSY  12/13/2021   IR RADIOLOGIST EVAL & MGMT  11/22/2021   LIVER BIOPSY  2004    Home Medications:  Allergies as of 08/13/2023       Reactions   Metoprolol Anaphylaxis   AFFECTED MOTOR SKILLS Tolerated coreg  03/02/20   Lithium Other (See Comments)   Emotional issues admitted after taking   Valproic Acid Other (See Comments)   hallucinations   Depakote [divalproex Sodium]    psychosis   Penicillins Hives   Depakote [divalproex Sodium] Anxiety   Nsaids Other (See Comments)   Gastritis Contraindicated due to Eliquis  and Creon    Penicillins Rash   Tolerated Ancef  February 2022   Sulfa Antibiotics Rash        Medication List        Accurate as of August 13, 2023   5:15 PM. If you have any questions, ask your nurse or doctor.          albuterol  108 (90 Base) MCG/ACT inhaler Commonly known as: VENTOLIN  HFA Inhale 1 puff into the lungs every 4 (four) hours as needed for wheezing or shortness of breath.   amiodarone  400 MG tablet Commonly known as: PACERONE  TAKE 1 TABLET(400 MG) BY MOUTH TWICE DAILY What changed: See the new instructions.   atorvastatin  20 MG tablet Commonly known as: LIPITOR Take 20 mg by mouth daily.   carvedilol  6.25 MG tablet Commonly known as: COREG  Take 1 tablet (6.25 mg total) by mouth 2 (two) times daily.   clonazePAM  1 MG tablet Commonly known as: KLONOPIN  Take 1 tablet by mouth 3 (three) times daily as needed.   Creon  36000 UNITS Cpep capsule Generic drug: lipase/protease/amylase Take 1 capsule by mouth. 2 cap qam and 2 cap at noon , 2 cap qpm and 1 cap qhs = 7 caps a day   Eliquis  2.5 MG Tabs tablet Generic drug: apixaban  Take 5 mg by mouth 2 (two) times daily.   fenofibrate  145 MG tablet Commonly known as: TRICOR  Take 145 mg by mouth daily.  Focalin  XR 20 MG 24 hr capsule Generic drug: dexmethylphenidate  Take 20 mg by mouth every evening.   Focalin  XR 30 MG Cp24 Generic drug: Dexmethylphenidate  HCl Take 1 capsule by mouth every morning.   folic acid  1 MG tablet Commonly known as: FOLVITE  Take 1 tablet (1 mg total) by mouth daily.   furosemide  20 MG tablet Commonly known as: LASIX  Take 20 mg by mouth daily.   magnesium  oxide 400 (240 Mg) MG tablet Commonly known as: MAG-OX Take 1 tablet (400 mg total) by mouth daily.   multivitamin with minerals Tabs tablet Take 1 tablet by mouth daily.   ondansetron  8 MG disintegrating tablet Commonly known as: ZOFRAN -ODT Take 8 mg by mouth 2 (two) times daily.   polyethylene glycol 17 g packet Commonly known as: MIRALAX  / GLYCOLAX  Take 1 packet by mouth daily.   sertraline  100 MG tablet Commonly known as: ZOLOFT  Take 200 mg by mouth daily.    sildenafil  50 MG tablet Commonly known as: VIAGRA  Take 1-2 tablets (50-100 mg total) by mouth daily as needed for erectile dysfunction. Started by: Willys Salvino   Symbicort  80-4.5 MCG/ACT inhaler Generic drug: budesonide -formoterol  Inhale 2 puffs into the lungs at bedtime as needed.   tadalafil  5 MG tablet Commonly known as: CIALIS  Take 1 tablet (5 mg total) by mouth daily as needed for erectile dysfunction.   thiamine  100 MG tablet Commonly known as: Vitamin B-1 Take 1 tablet (100 mg total) by mouth daily.        Allergies:  Allergies  Allergen Reactions   Metoprolol Anaphylaxis    AFFECTED MOTOR SKILLS Tolerated coreg  03/02/20   Lithium Other (See Comments)    Emotional issues admitted after taking   Valproic Acid Other (See Comments)    hallucinations    Depakote [Divalproex Sodium]     psychosis   Penicillins Hives   Depakote [Divalproex Sodium] Anxiety   Nsaids Other (See Comments)    Gastritis Contraindicated due to Eliquis  and Creon     Penicillins Rash    Tolerated Ancef  February 2022    Sulfa Antibiotics Rash    Family History: No family history on file.  Social History:   reports that he has never smoked. He has never used smokeless tobacco. He reports current alcohol use. He reports that he does not currently use drugs.  Physical Exam: BP (!) 177/111   Pulse (!) 108   Ht 6' 2 (1.88 m)   Wt 265 lb (120.2 kg)   BMI 34.02 kg/m   Constitutional:  Alert and oriented, no acute distress, nontoxic appearing HEENT: [place], AT Cardiovascular: No clubbing, cyanosis, or edema Respiratory: Normal respiratory effort, no increased work of breathing GU: Diffuse hyperpigmentation of the penile shaft, glans is spared. Skin: No rashes, bruises or suspicious lesions Neurologic: Grossly intact, no focal deficits, moving all 4 extremities Psychiatric: Normal mood and affect  Assessment & Plan:   1. Erectile dysfunction due to arterial insufficiency  (Primary) Well-controlled on sildenafil  demand dose, will refill at 50 mg tablets per patient preference. - sildenafil  (VIAGRA ) 50 MG tablet; Take 1-2 tablets (50-100 mg total) by mouth daily as needed for erectile dysfunction.  Dispense: 30 tablet; Refill: 5  2. Hyperpigmentation We discussed that this is most likely a benign process.  If any skin changes, he can come back for further evaluation.  Return if symptoms worsen or fail to improve.  Lucie Hones, PA-C  [facility] [address] [place], [place] [address] [phone]  [phone]  

## 2023-08-14 MED ORDER — SILDENAFIL CITRATE 20 MG PO TABS: ORAL_TABLET | ORAL | 0 refills | Status: DC

## 2023-08-14 NOTE — Addendum Note (Signed)
 Addended byBETHA CORIE PLATER on: 08/14/2023 03:20 PM   Modules accepted: Orders

## 2023-08-22 MED ORDER — SILDENAFIL CITRATE 20 MG PO TABS: ORAL_TABLET | ORAL | 11 refills | Status: AC

## 2023-08-22 NOTE — Addendum Note (Signed)
 Addended byBETHA CORIE PLATER on: 08/22/2023 02:10 PM   Modules accepted: Orders

## 2023-09-03 ENCOUNTER — Ambulatory Visit

## 2024-02-01 ENCOUNTER — Other Ambulatory Visit: Payer: Self-pay

## 2024-02-01 ENCOUNTER — Inpatient Hospital Stay
Admission: EM | Admit: 2024-02-01 | Discharge: 2024-02-05 | DRG: 440 | Disposition: A | Discharge status: Home / Self Care | Attending: Student | Admitting: Student

## 2024-02-01 ENCOUNTER — Emergency Department

## 2024-02-01 DIAGNOSIS — E860 Dehydration: Secondary | ICD-10-CM | POA: Diagnosis present

## 2024-02-01 DIAGNOSIS — Z880 Allergy status to penicillin: Secondary | ICD-10-CM

## 2024-02-01 DIAGNOSIS — R000 Tachycardia, unspecified: Secondary | ICD-10-CM | POA: Diagnosis present

## 2024-02-01 DIAGNOSIS — E785 Hyperlipidemia, unspecified: Secondary | ICD-10-CM | POA: Diagnosis present

## 2024-02-01 DIAGNOSIS — Z886 Allergy status to analgesic agent status: Secondary | ICD-10-CM

## 2024-02-01 DIAGNOSIS — D649 Anemia, unspecified: Secondary | ICD-10-CM | POA: Diagnosis present

## 2024-02-01 DIAGNOSIS — Z79899 Other long term (current) drug therapy: Secondary | ICD-10-CM

## 2024-02-01 DIAGNOSIS — Z7901 Long term (current) use of anticoagulants: Secondary | ICD-10-CM

## 2024-02-01 DIAGNOSIS — J4520 Mild intermittent asthma, uncomplicated: Secondary | ICD-10-CM | POA: Diagnosis present

## 2024-02-01 DIAGNOSIS — F419 Anxiety disorder, unspecified: Secondary | ICD-10-CM | POA: Diagnosis present

## 2024-02-01 DIAGNOSIS — Z86711 Personal history of pulmonary embolism: Secondary | ICD-10-CM

## 2024-02-01 DIAGNOSIS — Z7951 Long term (current) use of inhaled steroids: Secondary | ICD-10-CM

## 2024-02-01 DIAGNOSIS — F32A Depression, unspecified: Secondary | ICD-10-CM | POA: Diagnosis present

## 2024-02-01 DIAGNOSIS — K852 Alcohol induced acute pancreatitis without necrosis or infection: Principal | ICD-10-CM | POA: Diagnosis present

## 2024-02-01 DIAGNOSIS — R112 Nausea with vomiting, unspecified: Secondary | ICD-10-CM | POA: Diagnosis present

## 2024-02-01 DIAGNOSIS — F988 Other specified behavioral and emotional disorders with onset usually occurring in childhood and adolescence: Secondary | ICD-10-CM | POA: Diagnosis present

## 2024-02-01 DIAGNOSIS — Z882 Allergy status to sulfonamides status: Secondary | ICD-10-CM

## 2024-02-01 DIAGNOSIS — K859 Acute pancreatitis without necrosis or infection, unspecified: Principal | ICD-10-CM | POA: Diagnosis present

## 2024-02-01 DIAGNOSIS — F909 Attention-deficit hyperactivity disorder, unspecified type: Secondary | ICD-10-CM | POA: Diagnosis present

## 2024-02-01 DIAGNOSIS — Z888 Allergy status to other drugs, medicaments and biological substances status: Secondary | ICD-10-CM

## 2024-02-01 DIAGNOSIS — I480 Paroxysmal atrial fibrillation: Secondary | ICD-10-CM | POA: Diagnosis present

## 2024-02-01 DIAGNOSIS — I10 Essential (primary) hypertension: Secondary | ICD-10-CM | POA: Diagnosis present

## 2024-02-01 DIAGNOSIS — K860 Alcohol-induced chronic pancreatitis: Secondary | ICD-10-CM | POA: Diagnosis present

## 2024-02-01 DIAGNOSIS — F1010 Alcohol abuse, uncomplicated: Secondary | ICD-10-CM | POA: Diagnosis present

## 2024-02-01 DIAGNOSIS — D696 Thrombocytopenia, unspecified: Secondary | ICD-10-CM | POA: Diagnosis present

## 2024-02-01 LAB — URINALYSIS, ROUTINE W REFLEX MICROSCOPIC
Bilirubin Urine: NEGATIVE
Glucose, UA: NEGATIVE mg/dL
Hgb urine dipstick: NEGATIVE
Ketones, ur: NEGATIVE mg/dL
Nitrite: NEGATIVE
Protein, ur: 100 mg/dL — AB
Specific Gravity, Urine: 1.03 (ref 1.005–1.030)
pH: 6 (ref 5.0–8.0)

## 2024-02-01 LAB — COMPREHENSIVE METABOLIC PANEL WITH GFR
ALT: 22 U/L (ref 0–44)
AST: 34 U/L (ref 15–41)
Albumin: 4.8 g/dL (ref 3.5–5.0)
Alkaline Phosphatase: 68 U/L (ref 38–126)
Anion gap: 14 (ref 5–15)
BUN: 11 mg/dL (ref 6–20)
CO2: 27 mmol/L (ref 22–32)
Calcium: 9 mg/dL (ref 8.9–10.3)
Chloride: 97 mmol/L — ABNORMAL LOW (ref 98–111)
Creatinine, Ser: 0.85 mg/dL (ref 0.61–1.24)
GFR, Estimated: >60 mL/min
Glucose, Bld: 127 mg/dL — ABNORMAL HIGH (ref 70–99)
Potassium: 4.3 mmol/L (ref 3.5–5.1)
Sodium: 138 mmol/L (ref 135–145)
Total Bilirubin: 1.2 mg/dL (ref 0.0–1.2)
Total Protein: 8.6 g/dL — ABNORMAL HIGH (ref 6.5–8.1)

## 2024-02-01 LAB — CBC
HCT: 46.9 % (ref 39.0–52.0)
Hemoglobin: 16.6 g/dL (ref 13.0–17.0)
MCH: 31.1 pg (ref 26.0–34.0)
MCHC: 35.4 g/dL (ref 30.0–36.0)
MCV: 88 fL (ref 80.0–100.0)
Platelets: 170 K/uL (ref 150–400)
RBC: 5.33 MIL/uL (ref 4.22–5.81)
RDW: 13.4 % (ref 11.5–15.5)
WBC: 11.8 K/uL — ABNORMAL HIGH (ref 4.0–10.5)
nRBC: 0 % (ref 0.0–0.2)

## 2024-02-01 LAB — LIPASE, BLOOD: Lipase: >2800 U/L — ABNORMAL HIGH (ref 11–51)

## 2024-02-01 MED ORDER — LORAZEPAM 1 MG PO TABS
1.0000 mg | ORAL_TABLET | ORAL | Status: AC | PRN
  Administered 2024-02-02: 2 mg via ORAL
  Administered 2024-02-02: 1 mg via ORAL
  Filled 2024-02-01: qty 2
  Filled 2024-02-01: qty 1

## 2024-02-01 MED ORDER — SODIUM CHLORIDE 0.9 % IV BOLUS
1000.0000 mL | Freq: Once | INTRAVENOUS | Status: AC
  Administered 2024-02-01: 1000 mL via INTRAVENOUS

## 2024-02-01 MED ORDER — ONDANSETRON HCL 4 MG/2ML IJ SOLN
4.0000 mg | Freq: Four times a day (QID) | INTRAMUSCULAR | Status: DC | PRN
  Administered 2024-02-02 – 2024-02-03 (×3): 4 mg via INTRAVENOUS
  Filled 2024-02-01 (×3): qty 2

## 2024-02-01 MED ORDER — FOLIC ACID 1 MG PO TABS
1.0000 mg | ORAL_TABLET | Freq: Every day | ORAL | Status: DC
  Administered 2024-02-02 – 2024-02-05 (×4): 1 mg via ORAL
  Filled 2024-02-01 (×4): qty 1

## 2024-02-01 MED ORDER — LORAZEPAM 2 MG/ML IJ SOLN
1.0000 mg | INTRAMUSCULAR | Status: AC | PRN
  Administered 2024-02-02: 1 mg via INTRAVENOUS
  Filled 2024-02-01: qty 1

## 2024-02-01 MED ORDER — ADULT MULTIVITAMIN W/MINERALS CH
1.0000 | ORAL_TABLET | Freq: Every day | ORAL | Status: DC
  Administered 2024-02-02 – 2024-02-05 (×4): 1 via ORAL
  Filled 2024-02-01 (×4): qty 1

## 2024-02-01 MED ORDER — HYDROMORPHONE HCL 1 MG/ML IJ SOLN
2.0000 mg | INTRAMUSCULAR | Status: DC | PRN
  Administered 2024-02-02 – 2024-02-03 (×8): 2 mg via INTRAVENOUS
  Filled 2024-02-01 (×9): qty 2

## 2024-02-01 MED ORDER — SODIUM CHLORIDE 0.9 % IV BOLUS
1000.0000 mL | Freq: Once | INTRAVENOUS | Status: AC
  Administered 2024-02-02: 1000 mL via INTRAVENOUS

## 2024-02-01 MED ORDER — THIAMINE HCL 100 MG/ML IJ SOLN
100.0000 mg | Freq: Every day | INTRAMUSCULAR | Status: DC
  Filled 2024-02-01: qty 2

## 2024-02-01 MED ORDER — IOHEXOL 300 MG/ML  SOLN
100.0000 mL | Freq: Once | INTRAMUSCULAR | Status: AC | PRN
  Administered 2024-02-01: 100 mL via INTRAVENOUS

## 2024-02-01 MED ORDER — HYDROMORPHONE HCL 1 MG/ML IJ SOLN
2.0000 mg | Freq: Once | INTRAMUSCULAR | Status: AC
  Administered 2024-02-01: 2 mg via INTRAVENOUS
  Filled 2024-02-01: qty 2

## 2024-02-01 MED ORDER — THIAMINE MONONITRATE 100 MG PO TABS
100.0000 mg | ORAL_TABLET | Freq: Every day | ORAL | Status: DC
  Administered 2024-02-02 – 2024-02-05 (×4): 100 mg via ORAL
  Filled 2024-02-01 (×4): qty 1

## 2024-02-01 MED ORDER — ONDANSETRON HCL 4 MG/2ML IJ SOLN
4.0000 mg | Freq: Once | INTRAMUSCULAR | Status: AC
  Administered 2024-02-01: 4 mg via INTRAVENOUS
  Filled 2024-02-01: qty 2

## 2024-02-01 NOTE — ED Provider Notes (Signed)
 "  [facility] Provider Note    Event Date/Time   First MD Initiated Contact with Patient 02/01/24 2154     (approximate)   History   Chief Complaint: Abdominal Pain   HPI  Brian [name] is a 54 y.o. male with a history of alcohol abuse, pancreatitis, PE who comes ED complaining of severe epigastric pain radiating to his back for the last 24 hours.  Feels like pancreatitis.  Last alcohol was about 2 days ago, denies binge drinking.  Reports being compliant with his medications.  No fever chest pain shortness of breath        Past Medical History:  Diagnosis Date   A-fib [facility])    ADD (attention deficit disorder)    ADHD    Alcohol abuse    Anxiety    Anxiety and depression    Asthma    Congenital heart defect    Depression    History of ETOH abuse    Hyperlipidemia    Hypertension    Pulmonary thromboembolism (HCC)    Seizure [facility])    Thoracic aortic aneurysm     Current Outpatient Rx   Order #: [id number] Class: Historical Med   Order #: [id number] Class: Normal   Order #: [id number] Class: Historical Med   Order #: [id number] Class: Normal   Order #: [id number] Class: Historical Med   Order #: [id number] Class: Historical Med   Order #: [id number] Class: Historical Med   Order #: [id number] Class: Historical Med   Order #: [id number] Class: Historical Med   Order #: [id number] Class: Normal   Order #: [id number] Class: Historical Med   Order #: [id number] Class: Historical Med   Order #: [id number] Class: Normal   Order #: [id number] Class: Print   Order #: [id number] Class: Historical Med   Order #: [id number] Class: Historical Med   Order #: [id number] Class: Historical Med   Order #: [id number] Class: Normal   Order #: [id number] Class: Historical Med   Order #: [id number] Class: Normal   Order #: [id number] Class: Normal    Past Surgical History:  Procedure Laterality Date   artificial aorta and pig valve  02/25/2020   CARDIAC CATHETERIZATION     CARDIAC  SURGERY     CHOLECYSTECTOMY  2004   COLONOSCOPY     DENTAL SURGERY     IR KYPHO THORACIC WITH BONE BIOPSY  12/13/2021   IR RADIOLOGIST EVAL & MGMT  11/22/2021   LIVER BIOPSY  2004    Physical Exam   Triage Vital Signs: ED Triage Vitals  Encounter Vitals Group     BP 02/01/24 2035 (!) 143/102     Girls Systolic BP Percentile --      Girls Diastolic BP Percentile --      Boys Systolic BP Percentile --      Boys Diastolic BP Percentile --      Pulse Rate 02/01/24 2035 (!) 112     Resp 02/01/24 2035 18     Temp 02/01/24 2035 97.8 F (36.6 C)     Temp Source 02/01/24 2035 Oral     SpO2 02/01/24 2035 98 %     Weight 02/01/24 2035 225 lb (102.1 kg)     Height 02/01/24 2035 6' 1 (1.854 m)     Head Circumference --      Peak Flow --      Pain Score 02/01/24 2039 8     Pain Loc --      Pain Education --      Exclude from Growth Chart --  Most recent vital signs: Vitals:   02/01/24 2035 02/01/24 2330  BP: (!) 143/102 118/78  Pulse: (!) 112 98  Resp: 18   Temp: 97.8 F (36.6 C)   SpO2: 98% 91%    General: Awake, no distress.  CV:  Good peripheral perfusion.  Regular rate rhythm Resp:  Normal effort.  Clear lungs Abd:  No distention.  Soft with severe epigastric tenderness and diffuse generalized tenderness. Other:  Moist oral mucosa   ED Results / Procedures / Treatments   Labs (all labs ordered are listed, but only abnormal results are displayed) Labs Reviewed  LIPASE, BLOOD - Abnormal; Notable for the following components:      Result Value   Lipase >2,800 (*)    All other components within normal limits  COMPREHENSIVE METABOLIC PANEL WITH GFR - Abnormal; Notable for the following components:   Chloride 97 (*)    Glucose, Bld 127 (*)    Total Protein 8.6 (*)    All other components within normal limits  CBC - Abnormal; Notable for the following components:   WBC 11.8 (*)    All other components within normal limits  URINALYSIS, ROUTINE W REFLEX  MICROSCOPIC - Abnormal; Notable for the following components:   Color, Urine AMBER (*)    APPearance HAZY (*)    Protein, ur 100 (*)    Leukocytes,Ua TRACE (*)    Bacteria, UA RARE (*)    All other components within normal limits     EKG    RADIOLOGY CT abdomen pelvis pending   PROCEDURES:  Procedures   MEDICATIONS ORDERED IN ED: Medications  HYDROmorphone  (DILAUDID ) injection 2 mg (has no administration in time range)  ondansetron  (ZOFRAN ) injection 4 mg (has no administration in time range)  LORazepam  (ATIVAN ) tablet 1-4 mg (has no administration in time range)    Or  LORazepam  (ATIVAN ) injection 1-4 mg (has no administration in time range)  thiamine  (VITAMIN B1) tablet 100 mg (has no administration in time range)    Or  thiamine  (VITAMIN B1) injection 100 mg (has no administration in time range)  folic acid  (FOLVITE ) tablet 1 mg (has no administration in time range)  multivitamin with minerals tablet 1 tablet (has no administration in time range)  sodium chloride  0.9 % bolus 1,000 mL (has no administration in time range)  sodium chloride  0.9 % bolus 1,000 mL (1,000 mLs Intravenous New Bag/Given 02/01/24 2253)  HYDROmorphone  (DILAUDID ) injection 2 mg (2 mg Intravenous Given 02/01/24 2249)  ondansetron  (ZOFRAN ) injection 4 mg (4 mg Intravenous Given 02/01/24 2311)  iohexol  (OMNIPAQUE ) 300 MG/ML solution 100 mL (100 mLs Intravenous Contrast Given 02/01/24 2338)     IMPRESSION / MDM / ASSESSMENT AND PLAN / ED COURSE  I reviewed the triage vital signs and the nursing notes.  DDx: Pancreatitis, gastritis, viral illness, dehydration  Patient's presentation is most consistent with acute presentation with potential threat to life or bodily function.  Patient presents with severe epigastric pain and tenderness.  Low suspicious for recurrent pancreatitis.  Tachycardic.  Labs showed a lipase greater than 2800.  CMP and CBC unremarkable.  Will obtain CT abdomen pelvis.        FINAL CLINICAL IMPRESSION(S) / ED DIAGNOSES   Final diagnoses:  Acute pancreatitis, unspecified complication status, unspecified pancreatitis type     Rx / DC Orders   ED Discharge Orders     None        Note:  This document was prepared using Dragon voice recognition  software and may include unintentional dictation errors.   Viviann Pastor, MD 02/02/24 0005  "

## 2024-02-01 NOTE — ED Triage Notes (Signed)
 Pt presents for left sided abdominal pain. Hx pancreatitis. Took all prescribed medications without relief. Denies nausea, vomiting, diarrhea, constipation, fevers.

## 2024-02-02 ENCOUNTER — Encounter: Payer: Self-pay | Admitting: Internal Medicine

## 2024-02-02 DIAGNOSIS — Z886 Allergy status to analgesic agent status: Secondary | ICD-10-CM | POA: Diagnosis not present

## 2024-02-02 DIAGNOSIS — Z79899 Other long term (current) drug therapy: Secondary | ICD-10-CM | POA: Diagnosis not present

## 2024-02-02 DIAGNOSIS — J4520 Mild intermittent asthma, uncomplicated: Secondary | ICD-10-CM | POA: Diagnosis present

## 2024-02-02 DIAGNOSIS — F32A Depression, unspecified: Secondary | ICD-10-CM | POA: Diagnosis present

## 2024-02-02 DIAGNOSIS — D649 Anemia, unspecified: Secondary | ICD-10-CM | POA: Diagnosis present

## 2024-02-02 DIAGNOSIS — K859 Acute pancreatitis without necrosis or infection, unspecified: Secondary | ICD-10-CM | POA: Diagnosis present

## 2024-02-02 DIAGNOSIS — R000 Tachycardia, unspecified: Secondary | ICD-10-CM | POA: Diagnosis present

## 2024-02-02 DIAGNOSIS — Z7951 Long term (current) use of inhaled steroids: Secondary | ICD-10-CM | POA: Diagnosis not present

## 2024-02-02 DIAGNOSIS — Z7901 Long term (current) use of anticoagulants: Secondary | ICD-10-CM | POA: Diagnosis not present

## 2024-02-02 DIAGNOSIS — F1010 Alcohol abuse, uncomplicated: Secondary | ICD-10-CM | POA: Diagnosis present

## 2024-02-02 DIAGNOSIS — Z880 Allergy status to penicillin: Secondary | ICD-10-CM | POA: Diagnosis not present

## 2024-02-02 DIAGNOSIS — K852 Alcohol induced acute pancreatitis without necrosis or infection: Secondary | ICD-10-CM | POA: Diagnosis present

## 2024-02-02 DIAGNOSIS — Z86711 Personal history of pulmonary embolism: Secondary | ICD-10-CM | POA: Diagnosis not present

## 2024-02-02 DIAGNOSIS — R112 Nausea with vomiting, unspecified: Secondary | ICD-10-CM | POA: Diagnosis present

## 2024-02-02 DIAGNOSIS — Z888 Allergy status to other drugs, medicaments and biological substances status: Secondary | ICD-10-CM | POA: Diagnosis not present

## 2024-02-02 DIAGNOSIS — K860 Alcohol-induced chronic pancreatitis: Secondary | ICD-10-CM | POA: Diagnosis present

## 2024-02-02 DIAGNOSIS — F419 Anxiety disorder, unspecified: Secondary | ICD-10-CM | POA: Diagnosis present

## 2024-02-02 DIAGNOSIS — F988 Other specified behavioral and emotional disorders with onset usually occurring in childhood and adolescence: Secondary | ICD-10-CM | POA: Diagnosis present

## 2024-02-02 DIAGNOSIS — E785 Hyperlipidemia, unspecified: Secondary | ICD-10-CM | POA: Diagnosis present

## 2024-02-02 DIAGNOSIS — I480 Paroxysmal atrial fibrillation: Secondary | ICD-10-CM | POA: Diagnosis present

## 2024-02-02 DIAGNOSIS — E860 Dehydration: Secondary | ICD-10-CM | POA: Diagnosis present

## 2024-02-02 DIAGNOSIS — F909 Attention-deficit hyperactivity disorder, unspecified type: Secondary | ICD-10-CM | POA: Diagnosis present

## 2024-02-02 DIAGNOSIS — D696 Thrombocytopenia, unspecified: Secondary | ICD-10-CM | POA: Diagnosis present

## 2024-02-02 DIAGNOSIS — I10 Essential (primary) hypertension: Secondary | ICD-10-CM | POA: Diagnosis present

## 2024-02-02 DIAGNOSIS — Z882 Allergy status to sulfonamides status: Secondary | ICD-10-CM | POA: Diagnosis not present

## 2024-02-02 MED ORDER — SODIUM CHLORIDE 0.9 % IV SOLN: INTRAVENOUS | Status: AC

## 2024-02-02 MED ORDER — CLONAZEPAM 0.5 MG PO TABS: 1.0000 mg | ORAL_TABLET | Freq: Three times a day (TID) | ORAL | Status: DC | PRN

## 2024-02-02 MED ORDER — ACETAMINOPHEN 650 MG RE SUPP: 650.0000 mg | Freq: Four times a day (QID) | RECTAL | Status: DC | PRN

## 2024-02-02 MED ORDER — ACETAMINOPHEN 325 MG PO TABS: 650.0000 mg | ORAL_TABLET | Freq: Four times a day (QID) | ORAL | Status: DC | PRN

## 2024-02-02 MED ORDER — SENNOSIDES-DOCUSATE SODIUM 8.6-50 MG PO TABS: 1.0000 | ORAL_TABLET | Freq: Every evening | ORAL | Status: DC | PRN

## 2024-02-02 MED ORDER — PANCRELIPASE (LIP-PROT-AMYL) 12000-38000 UNITS PO CPEP
24000.0000 [IU] | ORAL_CAPSULE | Freq: Three times a day (TID) | ORAL | Status: DC
  Administered 2024-02-02 – 2024-02-03 (×5): 24000 [IU] via ORAL
  Filled 2024-02-02 (×5): qty 2

## 2024-02-02 MED ORDER — POLYETHYLENE GLYCOL 3350 17 G PO PACK
1.0000 | PACK | Freq: Every day | ORAL | Status: DC
  Administered 2024-02-02 – 2024-02-05 (×4): 17 g via ORAL
  Filled 2024-02-02 (×4): qty 1

## 2024-02-02 MED ORDER — DEXMETHYLPHENIDATE HCL ER 20 MG PO CP24: 20.0000 mg | ORAL_CAPSULE | Freq: Every evening | ORAL | Status: DC

## 2024-02-02 MED ORDER — CARVEDILOL 6.25 MG PO TABS: 6.2500 mg | ORAL_TABLET | Freq: Two times a day (BID) | ORAL | Status: DC

## 2024-02-02 MED ORDER — FENOFIBRATE 160 MG PO TABS
160.0000 mg | ORAL_TABLET | Freq: Every day | ORAL | Status: DC
  Administered 2024-02-03 – 2024-02-05 (×3): 160 mg via ORAL
  Filled 2024-02-02 (×3): qty 1

## 2024-02-02 MED ORDER — CARVEDILOL 6.25 MG PO TABS
6.2500 mg | ORAL_TABLET | Freq: Two times a day (BID) | ORAL | Status: DC
  Administered 2024-02-03 – 2024-02-04 (×4): 6.25 mg via ORAL
  Filled 2024-02-02 (×4): qty 1

## 2024-02-02 MED ORDER — DEXMETHYLPHENIDATE HCL ER 5 MG PO CP24
30.0000 mg | ORAL_CAPSULE | Freq: Every morning | ORAL | Status: DC
  Administered 2024-02-03 – 2024-02-05 (×3): 30 mg via ORAL
  Filled 2024-02-02 (×3): qty 6

## 2024-02-02 MED ORDER — SERTRALINE HCL 50 MG PO TABS
200.0000 mg | ORAL_TABLET | Freq: Every day | ORAL | Status: DC
  Administered 2024-02-02 – 2024-02-05 (×4): 200 mg via ORAL
  Filled 2024-02-02 (×4): qty 4

## 2024-02-02 MED ORDER — AMIODARONE HCL 200 MG PO TABS: 400.0000 mg | ORAL_TABLET | Freq: Two times a day (BID) | ORAL | Status: DC

## 2024-02-02 MED ORDER — ALBUTEROL SULFATE (2.5 MG/3ML) 0.083% IN NEBU
2.5000 mg | INHALATION_SOLUTION | RESPIRATORY_TRACT | Status: DC | PRN
  Administered 2024-02-02 – 2024-02-03 (×2): 2.5 mg via RESPIRATORY_TRACT
  Filled 2024-02-02 (×2): qty 3

## 2024-02-02 MED ORDER — FUROSEMIDE 20 MG PO TABS: 20.0000 mg | ORAL_TABLET | Freq: Every day | ORAL | Status: DC

## 2024-02-02 MED ORDER — DEXMETHYLPHENIDATE HCL ER 5 MG PO CP24: 30.0000 mg | ORAL_CAPSULE | Freq: Every morning | ORAL | Status: DC

## 2024-02-02 MED ORDER — ZOLPIDEM TARTRATE 5 MG PO TABS
5.0000 mg | ORAL_TABLET | Freq: Every evening | ORAL | Status: DC | PRN
  Administered 2024-02-03 – 2024-02-04 (×3): 5 mg via ORAL
  Filled 2024-02-02 (×3): qty 1

## 2024-02-02 MED ORDER — ATORVASTATIN CALCIUM 20 MG PO TABS
20.0000 mg | ORAL_TABLET | Freq: Every day | ORAL | Status: DC
  Administered 2024-02-02 – 2024-02-04 (×3): 20 mg via ORAL
  Filled 2024-02-02 (×3): qty 1

## 2024-02-02 MED ORDER — FLUTICASONE FUROATE-VILANTEROL 100-25 MCG/ACT IN AEPB
1.0000 | INHALATION_SPRAY | Freq: Every day | RESPIRATORY_TRACT | Status: DC
  Administered 2024-02-03 – 2024-02-05 (×3): 1 via RESPIRATORY_TRACT
  Filled 2024-02-02: qty 28

## 2024-02-02 MED ORDER — APIXABAN 5 MG PO TABS
5.0000 mg | ORAL_TABLET | Freq: Two times a day (BID) | ORAL | Status: DC
  Administered 2024-02-02 – 2024-02-05 (×7): 5 mg via ORAL
  Filled 2024-02-02 (×7): qty 1

## 2024-02-02 NOTE — ED Notes (Signed)
 Pt reporting to ED d/t abdominal pain related to ongoing pancreatitis. Pt states he has been dealing with the issue for a while, but pain became unbearable. Pt denies N/V, but is reporting 10/10 pain. Pt ABCs intact. RR even and unlabored. Pt in NAD but looks uncomfortable. Bed in lowest locked position. Call bell in reach. Denies needs at this time.   Past Medical History:  Diagnosis Date   A-fib [facility])    ADD (attention deficit disorder)    ADHD    Alcohol abuse    Anxiety    Anxiety and depression    Asthma    Congenital heart defect    Depression    History of ETOH abuse    Hyperlipidemia    Hypertension    Pulmonary thromboembolism (HCC)    Seizure (HCC)    Thoracic aortic aneurysm

## 2024-02-02 NOTE — TOC Initial Note (Signed)
 Transition of Care [facility]) - Initial/Assessment Note    Patient Details  Name: Brian [name] MRN: [id number] Date of Birth: [birth year]  Transition of Care [facility]) CM/SW Contact:    Marsia Cino L Ianmichael Amescua, LCSW Phone Number: 02/02/2024, 9:04 AM  Clinical Narrative:                    [name] consult received for substance abuse education/counseling. TOC does not provide education/counseling. Resources for substance abuse and PCP were added to the AVS.      Patient Goals and CMS Choice            Expected Discharge Plan and Services                                              Prior Living Arrangements/Services                       Activities of Daily Living   ADL Screening (condition at time of admission) Independently performs ADLs?: Yes (appropriate for developmental age) Is the patient deaf or have difficulty hearing?: No Does the patient have difficulty seeing, even when wearing glasses/contacts?: No Does the patient have difficulty concentrating, remembering, or making decisions?: No  Permission Sought/Granted                  Emotional Assessment              Admission diagnosis:  Alcoholic pancreatitis [K85.20] Acute pancreatitis, unspecified complication status, unspecified pancreatitis type [K85.90] Patient Active Problem List   Diagnosis Date Noted   Alcoholic pancreatitis 02/02/2024   A-fib (HCC) 04/15/2023   Alcohol withdrawal (HCC) 04/15/2023   AKI (acute kidney injury) 12/15/2021   Transaminitis 12/15/2021   Non-traumatic rhabdomyolysis 12/15/2021   Fever 12/15/2021   Hyperlipidemia, unspecified 11/22/2021   History of aortic root repair 07/08/2021   Current use of long term anticoagulation 10/14/2020   Orthostatic hypotension 04/29/2020   Palpitations 04/28/2020   Mild intermittent asthma without complication 03/10/2020   Personal history of pulmonary embolism 03/10/2020   Aortic aneurysm without rupture 03/10/2020    Violent behavior 03/04/2020   History of aortic valve replacement 02/25/2020   Ascending aortic aneurysm 02/25/2020   Thrombocytopenia 11/21/2019   Hypertriglyceridemia 12/29/2017   Drug-seeking behavior 07/23/2017   Essential (primary) hypertension 04/05/2017   Alcoholic fatty liver 04/13/2015   Alcohol-induced acute pancreatitis without infection or necrosis 04/13/2015   Major depressive disorder, single episode 11/06/2013   Mixed hyperlipidemia 11/06/2013   Asthma without status asthmaticus 11/06/2013   Other specified behavioral and emotional disorders with onset usually occurring in childhood and adolescence 11/06/2013   Anxiety and depression 11/06/2013   Alcoholic hepatitis without ascites (HCC) 11/06/2013   Attention deficit disorder 11/06/2013   Nondependent alcohol abuse, continuous drinking behavior 07/16/2013   Vitamin D deficiency 03/20/2012   PCP:  Blair Waddell Browning, MD Pharmacy:   [id number] - [place], [place] - [address] AT [facility] OF [address] [place] [address] Phone: [phone] Fax: [phone]  [id number] [place], [place] - [address] AT [facility] OF [address] [place] [address] Phone: [phone] Fax: [phone]     Social Drivers of Health (SDOH) Social History: SDOH Screenings   Food  Insecurity: No Food Insecurity (02/02/2024)  Housing: Low Risk (02/02/2024)  Transportation Needs: No Transportation Needs (02/02/2024)  Utilities: Not At Risk (02/02/2024)  Social Connections: Socially Isolated (02/02/2024)  Tobacco Use: Low Risk (04/15/2023)   SDOH Interventions:     Readmission Risk Interventions     No data to display

## 2024-02-02 NOTE — Plan of Care (Signed)

## 2024-02-02 NOTE — H&P (Addendum)
 " History and Physical    Brian [name] FMW:[id number] DOB: 01-12-1971 DOA: 02/01/2024  PCP: Blair Waddell Browning, MD (Confirm with patient/family/NH records and if not entered, this has to be entered at [facility] point of entry) Patient coming from: Home    Chief Complaint: Belly still hurts  HPI: Brian [name] is a 54 y.o. male with medical history significant of PAF on Eliquis , orthopedics, recurrent alcoholic pancreatitis, chronic pancreatic insufficiency, anxiety/depression, intermittent asthma, presented with worsening of abdominal pain nausea with vomiting.  Symptoms started 4 to 5 days ago, patient started to have cramping-like epigastric abdominal pain radiating to the back, associate with frequent nauseous vomiting after eating meals.  He started to take OTC NSAIDs initially with some help however he  last 2 days NSAIDS no longer helps and patient came to ED.  Denied any fever or chills, no diarrhea.  Claims that he does binge drinking on weekends and last drinking was 5 days ago  ED Course: Afebrile, borderline tachycardia blood pressure 118/78 O2 saturation 98% room air.  Blood work showed lipase more than 2800, AST 34 ALT 22 BUN 11 creatinine 0.8 WBC 11.8 hemoglobin 6.6.  CT abdomen pelvis showed diffuse pancreatitis but no peripancreatic fluid collection.  Patient was given IV bolus x 2 L and multiple rounds of Dilaudid  and Zofran .  Review of Systems: As per HPI otherwise 14 point review of systems negative.    Past Medical History:  Diagnosis Date   A-fib [facility])    ADD (attention deficit disorder)    ADHD    Alcohol abuse    Anxiety    Anxiety and depression    Asthma    Congenital heart defect    Depression    History of ETOH abuse    Hyperlipidemia    Hypertension    Pulmonary thromboembolism (HCC)    Seizure (HCC)    Thoracic aortic aneurysm     Past Surgical History:  Procedure Laterality Date   artificial aorta and pig valve  02/25/2020   CARDIAC  CATHETERIZATION     CARDIAC SURGERY     CHOLECYSTECTOMY  2004   COLONOSCOPY     DENTAL SURGERY     IR KYPHO THORACIC WITH BONE BIOPSY  12/13/2021   IR RADIOLOGIST EVAL & MGMT  11/22/2021   LIVER BIOPSY  2004     reports that he has never smoked. He has never used smokeless tobacco. He reports current alcohol use. He reports that he does not currently use drugs.  Allergies[1]  No family history on file.   Prior to Admission medications  Medication Sig Start Date End Date Taking? Authorizing Provider  albuterol  (PROVENTIL  HFA;VENTOLIN  HFA) 108 (90 Base) MCG/ACT inhaler Inhale 1 puff into the lungs every 4 (four) hours as needed for wheezing or shortness of breath.    [provider]  amiodarone  (PACERONE ) 400 MG tablet TAKE 1 TABLET(400 MG) BY MOUTH TWICE DAILY Patient taking differently: Take 400 mg by mouth 2 (two) times daily. 06/22/22   Fernand Denyse LABOR, MD  atorvastatin  (LIPITOR) 20 MG tablet Take 20 mg by mouth daily. 11/03/21   [provider]  carvedilol  (COREG ) 6.25 MG tablet Take 1 tablet (6.25 mg total) by mouth 2 (two) times daily. 04/18/23   Darci Pore, MD  clonazePAM  (KLONOPIN ) 1 MG tablet Take 1 tablet by mouth 3 (three) times daily as needed. 02/16/16   [provider]  ELIQUIS  2.5 MG TABS tablet Take 5 mg by mouth 2 (two) times daily.  11/06/21   [provider]  fenofibrate  (TRICOR ) 145 MG tablet Take 145 mg by mouth daily. 06/10/21   [provider]  FOCALIN  XR 20 MG 24 hr capsule Take 20 mg by mouth every evening. 10/04/21   [provider]  FOCALIN  XR 30 MG CP24 Take 1 capsule by mouth every morning. 11/04/21   [provider]  folic acid  (FOLVITE ) 1 MG tablet Take 1 tablet (1 mg total) by mouth daily. 04/19/23   Darci Pore, MD  furosemide  (LASIX ) 20 MG tablet Take 20 mg by mouth daily. 11/06/21   [provider]  lipase/protease/amylase (CREON ) 36000 UNITS CPEP capsule Take 1  capsule by mouth. 2 cap qam and 2 cap at noon , 2 cap qpm and 1 cap qhs = 7 caps a day 07/29/20   [provider]  magnesium  oxide (MAG-OX) 400 (240 Mg) MG tablet Take 1 tablet (400 mg total) by mouth daily. 04/18/23   Darci Pore, MD  Multiple Vitamin (MULTIVITAMIN WITH MINERALS) TABS tablet Take 1 tablet by mouth daily. 04/19/23   Darci Pore, MD  ondansetron  (ZOFRAN -ODT) 8 MG disintegrating tablet Take 8 mg by mouth 2 (two) times daily. 12/13/21   [provider]  polyethylene glycol (MIRALAX  / GLYCOLAX ) 17 g packet Take 1 packet by mouth daily. 04/09/22   [provider]  sertraline  (ZOLOFT ) 100 MG tablet Take 200 mg by mouth daily. 02/16/16   [provider]  sildenafil  (REVATIO ) 20 MG tablet Take 1-5 tablets (50-100 mg total) by mouth daily as needed for erectile dysfunction. 08/22/23   Vaillancourt, Samantha, PA-C  SYMBICORT  80-4.5 MCG/ACT inhaler Inhale 2 puffs into the lungs at bedtime as needed. 12/07/21   [provider]  tadalafil  (CIALIS ) 5 MG tablet Take 1 tablet (5 mg total) by mouth daily as needed for erectile dysfunction. 02/21/23   Helon Kirsch A, PA-C  thiamine  (VITAMIN B-1) 100 MG tablet Take 1 tablet (100 mg total) by mouth daily. 04/19/23   Darci Pore, MD    Physical Exam: Vitals:   02/02/24 0134 02/02/24 0216 02/02/24 0426 02/02/24 0801  BP: 112/75  110/66 124/80  Pulse: 91  83 (!) 107  Resp: 16  12 18   Temp: 98 F (36.7 C)  97.6 F (36.4 C) 99.1 F (37.3 C)  TempSrc: Oral  Oral   SpO2: 97%  91% 94%  Weight:  102.8 kg    Height:  6' 1 (1.854 m)      Constitutional: NAD, calm, comfortable Vitals:   02/02/24 0134 02/02/24 0216 02/02/24 0426 02/02/24 0801  BP: 112/75  110/66 124/80  Pulse: 91  83 (!) 107  Resp: 16  12 18   Temp: 98 F (36.7 C)  97.6 F (36.4 C) 99.1 F (37.3 C)  TempSrc: Oral  Oral   SpO2: 97%  91% 94%  Weight:  102.8 kg    Height:  6' 1 (1.854 m)     Eyes: PERRL,  lids and conjunctivae normal ENMT: Mucous membranes are dry. Posterior pharynx clear of any exudate or lesions.Normal dentition.  Neck: normal, supple, no masses, no thyromegaly Respiratory: clear to auscultation bilaterally, no wheezing, no crackles. Normal respiratory effort. No accessory muscle use.  Cardiovascular: Regular rate and rhythm, no murmurs / rubs / gallops. No extremity edema. 2+ pedal pulses. No carotid bruits.  Abdomen: Tenderness on epigastric area, no abdominal guarding, no masses palpated. No hepatosplenomegaly. Bowel sounds positive.  Musculoskeletal: no clubbing / cyanosis. No joint deformity upper and lower  extremities. Good ROM, no contractures. Normal muscle tone.  Skin: no rashes, lesions, ulcers. No induration Neurologic: CN 2-12 grossly intact. Sensation intact, DTR normal. Strength 5/5 in all 4.  Psychiatric: Normal judgment and insight. Alert and oriented x 3. Normal mood.    Labs on Admission: I have personally reviewed following labs and imaging studies  CBC: Recent Labs  Lab 02/01/24 2042  WBC 11.8*  HGB 16.6  HCT 46.9  MCV 88.0  PLT 170   Basic Metabolic Panel: Recent Labs  Lab 02/01/24 2042  NA 138  K 4.3  CL 97*  CO2 27  GLUCOSE 127*  BUN 11  CREATININE 0.85  CALCIUM  9.0   GFR: Estimated Creatinine Clearance: 126.7 mL/min (by C-G formula based on SCr of 0.85 mg/dL). Liver Function Tests: Recent Labs  Lab 02/01/24 2042  AST 34  ALT 22  ALKPHOS 68  BILITOT 1.2  PROT 8.6*  ALBUMIN 4.8   Recent Labs  Lab 02/01/24 2042  LIPASE >2,800*   No results for input(s): AMMONIA in the last 168 hours. Coagulation Profile: No results for input(s): INR, PROTIME in the last 168 hours. Cardiac Enzymes: No results for input(s): CKTOTAL, CKMB, CKMBINDEX, TROPONINI in the last 168 hours. BNP (last 3 results) No results for input(s): PROBNP in the last 8760 hours. HbA1C: No results for input(s): HGBA1C in the last 72  hours. CBG: No results for input(s): GLUCAP in the last 168 hours. Lipid Profile: No results for input(s): CHOL, HDL, LDLCALC, TRIG, CHOLHDL, LDLDIRECT in the last 72 hours. Thyroid Function Tests: No results for input(s): TSH, T4TOTAL, FREET4, T3FREE, THYROIDAB in the last 72 hours. Anemia Panel: No results for input(s): VITAMINB12, FOLATE, FERRITIN, TIBC, IRON , RETICCTPCT in the last 72 hours. Urine analysis:    Component Value Date/Time   COLORURINE AMBER (A) 02/01/2024 2037   APPEARANCEUR HAZY (A) 02/01/2024 2037   APPEARANCEUR Hazy (A) 03/02/2022 1412   LABSPEC 1.030 02/01/2024 2037   PHURINE 6.0 02/01/2024 2037   GLUCOSEU NEGATIVE 02/01/2024 2037   HGBUR NEGATIVE 02/01/2024 2037   BILIRUBINUR NEGATIVE 02/01/2024 2037   BILIRUBINUR Negative 03/02/2022 1412   KETONESUR NEGATIVE 02/01/2024 2037   PROTEINUR 100 (A) 02/01/2024 2037   NITRITE NEGATIVE 02/01/2024 2037   LEUKOCYTESUR TRACE (A) 02/01/2024 2037    Radiological Exams on Admission: CT ABDOMEN PELVIS W CONTRAST Result Date: 02/01/2024 EXAM: CT ABDOMEN AND PELVIS WITH CONTRAST 02/01/2024 11:46:26 PM TECHNIQUE: CT of the abdomen and pelvis was performed with the administration of 100 mL of iohexol  (OMNIPAQUE ) 300 MG/ML solution. Multiplanar reformatted images are provided for review. Automated exposure control, iterative reconstruction, and/or weight-based adjustment of the mA/kV was utilized to reduce the radiation dose to as low as reasonably achievable. COMPARISON: 11/04/2021 CLINICAL HISTORY: Pancreatitis, acute, severe. FINDINGS: LOWER CHEST: No acute abnormality. LIVER: Moderate hepatic steatosis. No intrahepatic mass. No intrahepatic biliary ductal dilation. Mild hepatomegaly. Trace perihepatic ascites. GALLBLADDER AND BILE DUCTS: Status post cholecystectomy. No biliary ductal dilatation. SPLEEN: Stable mild splenomegaly. No intrasplenic lesion. PANCREAS: Extensive peripancreatic  inflammatory stranding involving the pancreas diffusely and changes of acute interstitial / edematous pancreatitis. Normal enhancement of the pancreatic parenchyma. No loculated peripancreatic fluid collections. The pancreatic duct is nondilated. ADRENAL GLANDS: No acute abnormality. KIDNEYS, URETERS AND BLADDER: The inflammatory fluid tracks into the left anterior perirenal space. No stones in the kidneys or ureters. No hydronephrosis. No perinephric or periureteral stranding. Urinary bladder is unremarkable. GI AND BOWEL: The inflammatory fluid tracks within the lesser sac to surround the  gastric fundus as well as tracks into the mesenteric root. Appendix normal. The stomach, small bowel, and large bowel are otherwise unremarkable. There is no bowel obstruction. PERITONEUM AND RETROPERITONEUM: Trace perihepatic ascites. Small volume ascites within the pelvis. No free air. VASCULATURE: Aorta is normal in caliber. Status post aortic valve replacement. LYMPH NODES: No lymphadenopathy. REPRODUCTIVE ORGANS: No acute abnormality. BONES AND SOFT TISSUES: Interval T12 vertebroplasty. No acute osseous abnormality. No focal soft tissue abnormality. IMPRESSION: 1. Acute interstitial edematous pancreatitis with extensive peripancreatic inflammatory stranding, without loculated peripancreatic fluid collection, with inflammatory fluid tracking in the lesser sac, mesenteric root, and left anterior perirenal space. 2. Small volume ascites, including trace perihepatic and pelvic ascites. 3. Mild hepatomegaly with moderate hepatic steatosis. 4. Stable splenomegaly. 5. Status post aortic valve replacement, cholecystectomy, and interval T12 vertebroplasty. Electronically signed by: Dorethia Molt MD MD 02/01/2024 11:56 PM EST RP Workstation: [id number]    EKG: None  Assessment/Plan Principal Problem:   Alcoholic pancreatitis Active Problems:   Pancreatitis  (please populate well all problems here in Problem List. (For  example, if patient is on BP meds at home and you resume or decide to hold them, it is a problem that needs to be her. Same for CAD, COPD, HLD and so on)  Recurrent alcoholic pancreatitis - Continue supportive care, narcotic for pain - IV fluid - Patient reports slightly improvement of abdominal pain, waiting to try clear liquid diet today. - Repeat lipase tomorrow - Education about abstinence from alcohol during  Leukocytosis - Reactive, imaging already showed no signs of necrotic lesions inside pancreas, monitor off antibiotics.  PAF - In sinus rhythm - Pharmacy reviewed patient's record which showed patient has not been taking amiodarone . - Continue Eliquis  -  Alcohol abuse - No symptoms or signs of acute withdrawal at this point - Continue CIWA protocol with as needed benzos  HTN - Controlled, continue Coreg  - Hold off Lasix  as patient is severely dehydrated  HLD - Continue statin and gemfibrozil  DVT prophylaxis: Lovenox Code Status: Full code Family Communication: None at bedside Disposition Plan: Patient sick with acute pancreatitis requiring IV fluid IV pain medications, expect more than 2 midnight hospital stay. Consults called: None Admission status: Telemetry admission   Cort ONEIDA Mana MD [facility] Pager [phone]  02/02/2024, 10:57 AM        [1]  Allergies Allergen Reactions   Metoprolol Anaphylaxis    AFFECTED MOTOR SKILLS Tolerated coreg  03/02/20   Lithium Other (See Comments)    Emotional issues admitted after taking   Valproic Acid Other (See Comments)    hallucinations    Depakote [Divalproex Sodium]     psychosis   Penicillins Hives   Depakote [Divalproex Sodium] Anxiety   Nsaids Other (See Comments)    Gastritis Contraindicated due to Eliquis  and Creon     Penicillins Rash    Tolerated Ancef  February 2022    Sulfa Antibiotics Rash   "

## 2024-02-02 NOTE — Discharge Instructions (Signed)

## 2024-02-03 LAB — CBC
HCT: 38.6 % — ABNORMAL LOW (ref 39.0–52.0)
Hemoglobin: 12.7 g/dL — ABNORMAL LOW (ref 13.0–17.0)
MCH: 31 pg (ref 26.0–34.0)
MCHC: 32.9 g/dL (ref 30.0–36.0)
MCV: 94.1 fL (ref 80.0–100.0)
Platelets: 128 K/uL — ABNORMAL LOW (ref 150–400)
RBC: 4.1 MIL/uL — ABNORMAL LOW (ref 4.22–5.81)
RDW: 13.6 % (ref 11.5–15.5)
WBC: 7.7 K/uL (ref 4.0–10.5)
nRBC: 0 % (ref 0.0–0.2)

## 2024-02-03 LAB — LIPASE, BLOOD: Lipase: 366 U/L — ABNORMAL HIGH (ref 11–51)

## 2024-02-03 MED ORDER — HYDROMORPHONE HCL 1 MG/ML IJ SOLN: 1.0000 mg | INTRAMUSCULAR | Status: DC | PRN

## 2024-02-03 MED ORDER — HYDROMORPHONE HCL 2 MG PO TABS: 1.0000 mg | ORAL_TABLET | ORAL | Status: DC | PRN

## 2024-02-03 MED ORDER — PANCRELIPASE (LIP-PROT-AMYL) 12000-38000 UNITS PO CPEP
72000.0000 [IU] | ORAL_CAPSULE | Freq: Three times a day (TID) | ORAL | Status: DC
  Administered 2024-02-04 – 2024-02-05 (×4): 72000 [IU] via ORAL
  Filled 2024-02-03 (×4): qty 6

## 2024-02-03 MED ORDER — CLONAZEPAM 0.5 MG PO TABS
1.0000 mg | ORAL_TABLET | Freq: Three times a day (TID) | ORAL | Status: DC | PRN
  Administered 2024-02-03 – 2024-02-05 (×4): 1 mg via ORAL
  Filled 2024-02-03 (×4): qty 2

## 2024-02-03 MED ORDER — HYDROMORPHONE HCL 2 MG PO TABS
2.0000 mg | ORAL_TABLET | ORAL | Status: DC | PRN
  Administered 2024-02-03 (×2): 2 mg via ORAL
  Filled 2024-02-03 (×2): qty 1

## 2024-02-03 MED ORDER — HYDROMORPHONE HCL 2 MG PO TABS
3.0000 mg | ORAL_TABLET | ORAL | Status: DC | PRN
  Administered 2024-02-03 – 2024-02-05 (×10): 3 mg via ORAL
  Filled 2024-02-03 (×10): qty 2

## 2024-02-03 MED ORDER — HYDROMORPHONE HCL 2 MG PO TABS
2.0000 mg | ORAL_TABLET | ORAL | Status: DC | PRN
  Filled 2024-02-03: qty 1

## 2024-02-03 MED ORDER — PANCRELIPASE (LIP-PROT-AMYL) 12000-38000 UNITS PO CPEP: 72000.0000 [IU] | ORAL_CAPSULE | Freq: Three times a day (TID) | ORAL | Status: DC

## 2024-02-03 MED ORDER — PANCRELIPASE (LIP-PROT-AMYL) 12000-38000 UNITS PO CPEP
36000.0000 [IU] | ORAL_CAPSULE | Freq: Every day | ORAL | Status: DC
  Administered 2024-02-03 – 2024-02-04 (×2): 36000 [IU] via ORAL
  Filled 2024-02-03 (×2): qty 3

## 2024-02-03 NOTE — Progress Notes (Signed)
 " Progress Note   Patient: Brian [name] FMW:[id number] DOB: 05-08-1970 DOA: 02/01/2024     1 DOS: the patient was seen and examined on 02/03/2024   Brief hospital course: Per H&P HPI   HPI: [place] Chelf is a 54 y.o. male with medical history significant of PAF on Eliquis , orthopedics, recurrent alcoholic pancreatitis, chronic pancreatic insufficiency, anxiety/depression, intermittent asthma, presented with worsening of abdominal pain nausea with vomiting.   Symptoms started 4 to 5 days ago, patient started to have cramping-like epigastric abdominal pain radiating to the back, associate with frequent nauseous vomiting after eating meals.  He started to take OTC NSAIDs initially with some help however he  last 2 days NSAIDS no longer helps and patient came to ED.  Denied any fever or chills, no diarrhea.  Claims that he does binge drinking on weekends and last drinking was 5 days ago   ED Course: Afebrile, borderline tachycardia blood pressure 118/78 O2 saturation 98% room air.  Blood work showed lipase more than 2800, AST 34 ALT 22 BUN 11 creatinine 0.8 WBC 11.8 hemoglobin 6.6.  CT abdomen pelvis showed diffuse pancreatitis but no peripancreatic fluid collection.   Patient was given IV bolus x 2 L and multiple rounds of Dilaudid  and Zofran .  Assessment and Plan: Recurrent alcoholic pancreatitis Elevated lipase, epigastric abdominal pain, and imaging findings consistent with acute pancreatitis He is status postcholecystectomy Patient able to tolerate liquids would like his diet advanced to solids Pain control  Discussed with patient if willing to trial naltrexone Continue Creon   Alcohol use disorder Discussed with patient if use trial of naltrexone and/or CBT Continue CIWA with Ativan  as needed Multivitamins  Thrombocytopenia Due to alcohol use Check B12, folate, iron  Continue to monitor   Asthma Not in actue exacerbation. On symbicort  at home.  Uses albuterol  BID as well.   Some wheezing on exam, albuterol  nebs and breo ellipta  while inpatient   PAF Currently normal sinus rhythm, not taking amiodarone  outpatient.   Continue home Coreg  and Eliquis    HTN  Controlled, conitnue coreg     S/p AVR Aortic aneurtys s/p repair  Eliquis        Subjective: Pain is improved and would like solid food  Physical Exam: Vitals:   02/03/24 0419 02/03/24 0500 02/03/24 0843 02/03/24 1552  BP: (!) 136/90  126/85 133/86  Pulse: 100  92 87  Resp: 17  19 18   Temp: 99.5 F (37.5 C)  98.2 F (36.8 C) 98.4 F (36.9 C)  TempSrc:   Oral Oral  SpO2: 97% 93% 93% 97%  Weight:      Height:       Physical Exam  Constitutional: In no distress.  Cardiovascular: Normal rate, regular rhythm. No lower extremity edema  Pulmonary: Non labored breathing on room air, no wheezing or rales.   Abdominal: Soft. Non distended and non tender Musculoskeletal: Normal range of motion.     Neurological: Alert and oriented to person, place, and time. Non focal  Skin: Skin is warm and dry.   Data Reviewed:     Latest Ref Rng & Units 02/01/2024    8:42 PM 04/18/2023    5:44 AM 04/17/2023    5:17 AM  BMP  Glucose 70 - 99 mg/dL 872  885  879   BUN 6 - 20 mg/dL 11  14  15    Creatinine 0.61 - 1.24 mg/dL 9.14  9.03  9.14   Sodium 135 - 145 mmol/L 138  134  134   Potassium  3.5 - 5.1 mmol/L 4.3  4.7  3.4   Chloride 98 - 111 mmol/L 97  100  96   CO2 22 - 32 mmol/L 27  29  27    Calcium  8.9 - 10.3 mg/dL 9.0  8.5  8.1       Latest Ref Rng & Units 02/03/2024    7:54 AM 02/01/2024    8:42 PM 04/18/2023    5:44 AM  CBC  WBC 4.0 - 10.5 K/uL 7.7  11.8  3.4   Hemoglobin 13.0 - 17.0 g/dL 87.2  83.3  86.1   Hematocrit 39.0 - 52.0 % 38.6  46.9  39.4   Platelets 150 - 400 K/uL 128  170  90      Family Communication: None  Disposition: Status is: Inpatient Remains inpatient appropriate because: further monitoring of pancreatitis.  Planned Discharge Destination: Home    Time spent: 35  minutes  Author: Alban Pepper, MD 02/03/2024 4:32 PM  For on call review [email or web address]  "

## 2024-02-03 NOTE — Plan of Care (Signed)

## 2024-02-04 ENCOUNTER — Other Ambulatory Visit (HOSPITAL_COMMUNITY): Payer: Self-pay

## 2024-02-04 DIAGNOSIS — K852 Alcohol induced acute pancreatitis without necrosis or infection: Principal | ICD-10-CM

## 2024-02-04 LAB — IRON AND TIBC
Iron: 37 ug/dL — ABNORMAL LOW (ref 45–182)
Saturation Ratios: 10 % — ABNORMAL LOW (ref 17.9–39.5)
TIBC: 361 ug/dL (ref 250–450)
UIBC: 324 ug/dL

## 2024-02-04 LAB — BASIC METABOLIC PANEL WITH GFR
Anion gap: 9 (ref 5–15)
BUN: 12 mg/dL (ref 6–20)
CO2: 26 mmol/L (ref 22–32)
Calcium: 8.7 mg/dL — ABNORMAL LOW (ref 8.9–10.3)
Chloride: 100 mmol/L (ref 98–111)
Creatinine, Ser: 0.73 mg/dL (ref 0.61–1.24)
GFR, Estimated: >60 mL/min
Glucose, Bld: 113 mg/dL — ABNORMAL HIGH (ref 70–99)
Potassium: 4.2 mmol/L (ref 3.5–5.1)
Sodium: 135 mmol/L (ref 135–145)

## 2024-02-04 LAB — CBC WITH DIFFERENTIAL/PLATELET
Abs Immature Granulocytes: 0.03 K/uL (ref 0.00–0.07)
Basophils Absolute: 0 K/uL (ref 0.0–0.1)
Basophils Relative: 0 %
Eosinophils Absolute: 0.3 K/uL (ref 0.0–0.5)
Eosinophils Relative: 6 %
HCT: 37.2 % — ABNORMAL LOW (ref 39.0–52.0)
Hemoglobin: 12.8 g/dL — ABNORMAL LOW (ref 13.0–17.0)
Immature Granulocytes: 1 %
Lymphocytes Relative: 23 %
Lymphs Abs: 1.1 K/uL (ref 0.7–4.0)
MCH: 30.9 pg (ref 26.0–34.0)
MCHC: 34.4 g/dL (ref 30.0–36.0)
MCV: 89.9 fL (ref 80.0–100.0)
Monocytes Absolute: 0.4 K/uL (ref 0.1–1.0)
Monocytes Relative: 7 %
Neutro Abs: 3 K/uL (ref 1.7–7.7)
Neutrophils Relative %: 63 %
Platelets: 130 K/uL — ABNORMAL LOW (ref 150–400)
RBC: 4.14 MIL/uL — ABNORMAL LOW (ref 4.22–5.81)
RDW: 12.9 % (ref 11.5–15.5)
WBC: 4.8 K/uL (ref 4.0–10.5)
nRBC: 0 % (ref 0.0–0.2)

## 2024-02-04 LAB — FERRITIN: Ferritin: 599 ng/mL — ABNORMAL HIGH (ref 24–336)

## 2024-02-04 LAB — VITAMIN B12: Vitamin B-12: 609 pg/mL (ref 180–914)

## 2024-02-04 LAB — MAGNESIUM: Magnesium: 1.5 mg/dL — ABNORMAL LOW (ref 1.7–2.4)

## 2024-02-04 LAB — FOLATE: Folate: >20 ng/mL

## 2024-02-04 LAB — PHOSPHORUS: Phosphorus: 2.5 mg/dL (ref 2.5–4.6)

## 2024-02-04 MED ORDER — CARVEDILOL 6.25 MG PO TABS
12.5000 mg | ORAL_TABLET | Freq: Two times a day (BID) | ORAL | Status: DC
  Administered 2024-02-05: 12.5 mg via ORAL
  Filled 2024-02-04: qty 2

## 2024-02-04 MED ORDER — ACAMPROSATE CALCIUM 333 MG PO TBEC
666.0000 mg | DELAYED_RELEASE_TABLET | Freq: Three times a day (TID) | ORAL | Status: DC
  Filled 2024-02-04 (×3): qty 2

## 2024-02-04 MED ORDER — HYDRALAZINE HCL 50 MG PO TABS: 50.0000 mg | ORAL_TABLET | Freq: Three times a day (TID) | ORAL | Status: DC | PRN

## 2024-02-04 MED ORDER — ALBUTEROL SULFATE (2.5 MG/3ML) 0.083% IN NEBU: 3.0000 mL | INHALATION_SOLUTION | RESPIRATORY_TRACT | Status: DC | PRN

## 2024-02-04 MED ORDER — IRON SUCROSE 300 MG IVPB - SIMPLE MED
300.0000 mg | Freq: Once | Status: AC
  Administered 2024-02-04: 300 mg via INTRAVENOUS
  Filled 2024-02-04: qty 300

## 2024-02-04 MED ORDER — MAGNESIUM SULFATE 2 GM/50ML IV SOLN
2.0000 g | Freq: Once | INTRAVENOUS | Status: AC
  Administered 2024-02-04: 2 g via INTRAVENOUS
  Filled 2024-02-04: qty 50

## 2024-02-04 NOTE — Progress Notes (Signed)
 " Progress Note   Patient: Brian [name] FMW:[id number] DOB: 07/21/1970 DOA: 02/01/2024     2 DOS: the patient was seen and examined on 02/04/2024   Brief hospital course: Per H&P HPI   HPI: Brian [name] is a 54 y.o. male with medical history significant of PAF on Eliquis , orthopedics, recurrent alcoholic pancreatitis, chronic pancreatic insufficiency, anxiety/depression, intermittent asthma, presented with worsening of abdominal pain nausea with vomiting.   Symptoms started 4 to 5 days ago, patient started to have cramping-like epigastric abdominal pain radiating to the back, associate with frequent nauseous vomiting after eating meals.  He started to take OTC NSAIDs initially with some help however he  last 2 days NSAIDS no longer helps and patient came to ED.  Denied any fever or chills, no diarrhea.  Claims that he does binge drinking on weekends and last drinking was 5 days ago   ED Course: Afebrile, borderline tachycardia blood pressure 118/78 O2 saturation 98% room air.  Blood work showed lipase more than 2800, AST 34 ALT 22 BUN 11 creatinine 0.8 WBC 11.8 hemoglobin 6.6.  CT abdomen pelvis showed diffuse pancreatitis but no peripancreatic fluid collection.   Patient was given IV bolus x 2 L and multiple rounds of Dilaudid  and Zofran .  Assessment and Plan: Recurrent alcoholic pancreatitis Elevated lipase, epigastric abdominal pain, and imaging findings consistent with acute pancreatitis He is status postcholecystectomy Patient able to tolerate liquids would like his diet advanced to solids Pain control  Discussed with patient if willing to trial naltrexone Continue Creon   Alcohol use disorder Patient has tried naltrexone and AA in the past. Continue CIWA with Ativan  as needed Start compensate Multivitamins  Normocytic anemia Thrombocytopenia Due to alcohol use, iron  deficient folate WNL  No signs of bleeding. -IV Fe, po supplementation on discharge  -F/u B12     Asthma Not in actue exacerbation. On symbicort  at home.  Uses albuterol  BID as well.  Some wheezing on exam, albuterol  nebs and breo ellipta  while inpatient   PAF Currently normal sinus rhythm, not taking amiodarone  outpatient.   Continue home Coreg  and Eliquis    HTN  Elevated, increase coreg .   S/p AVR Aortic aneurtys s/p repair  Eliquis        Subjective: Had some abdominal pain with eating more solid food.  Discussed trying liquids for now.  Physical Exam: Vitals:   02/03/24 2002 02/04/24 0424 02/04/24 0809 02/04/24 1650  BP: 110/78 (!) 156/105 (!) 165/99 (!) 156/100  Pulse: 78 73 79 78  Resp: 14 18 18 18   Temp: 98.4 F (36.9 C) (!) 97.4 F (36.3 C) 97.8 F (36.6 C) 97.7 F (36.5 C)  TempSrc:      SpO2: 94% 98% 98% 96%  Weight:      Height:       Physical Exam  Constitutional: In no distress.  Cardiovascular: Normal rate, regular rhythm. No lower extremity edema  Pulmonary: Non labored breathing on room air, no wheezing or rales.   Abdominal: Soft. Non distended and mildly TTP in epigastrium  Musculoskeletal: Normal range of motion.     Neurological: Alert and oriented to person, place, and time. Non focal  Skin: Skin is warm and dry.    Data Reviewed:     Latest Ref Rng & Units 02/04/2024   10:38 AM 02/01/2024    8:42 PM 04/18/2023    5:44 AM  BMP  Glucose 70 - 99 mg/dL 886  872  885   BUN 6 - 20 mg/dL 12  11  14   Creatinine 0.61 - 1.24 mg/dL 9.26  9.14  9.03   Sodium 135 - 145 mmol/L 135  138  134   Potassium 3.5 - 5.1 mmol/L 4.2  4.3  4.7   Chloride 98 - 111 mmol/L 100  97  100   CO2 22 - 32 mmol/L 26  27  29    Calcium  8.9 - 10.3 mg/dL 8.7  9.0  8.5       Latest Ref Rng & Units 02/04/2024   10:38 AM 02/03/2024    7:54 AM 02/01/2024    8:42 PM  CBC  WBC 4.0 - 10.5 K/uL 4.8  7.7  11.8   Hemoglobin 13.0 - 17.0 g/dL 87.1  87.2  83.3   Hematocrit 39.0 - 52.0 % 37.2  38.6  46.9   Platelets 150 - 400 K/uL 130  128  170      Family  Communication: None  Disposition: Status is: Inpatient Remains inpatient appropriate because: further monitoring of pancreatitis.  Planned Discharge Destination: Home    Time spent: 35 minutes  Author: Alban Pepper, MD 02/04/2024 5:09 PM  For on call review [email or web address]  "

## 2024-02-05 LAB — CBC WITH DIFFERENTIAL/PLATELET
Abs Immature Granulocytes: 0.02 K/uL (ref 0.00–0.07)
Basophils Absolute: 0 K/uL (ref 0.0–0.1)
Basophils Relative: 1 %
Eosinophils Absolute: 0.3 K/uL (ref 0.0–0.5)
Eosinophils Relative: 7 %
HCT: 37.3 % — ABNORMAL LOW (ref 39.0–52.0)
Hemoglobin: 12.9 g/dL — ABNORMAL LOW (ref 13.0–17.0)
Immature Granulocytes: 1 %
Lymphocytes Relative: 20 %
Lymphs Abs: 0.8 K/uL (ref 0.7–4.0)
MCH: 30.9 pg (ref 26.0–34.0)
MCHC: 34.6 g/dL (ref 30.0–36.0)
MCV: 89.4 fL (ref 80.0–100.0)
Monocytes Absolute: 0.3 K/uL (ref 0.1–1.0)
Monocytes Relative: 9 %
Neutro Abs: 2.4 K/uL (ref 1.7–7.7)
Neutrophils Relative %: 62 %
Platelets: 128 K/uL — ABNORMAL LOW (ref 150–400)
RBC: 4.17 MIL/uL — ABNORMAL LOW (ref 4.22–5.81)
RDW: 12.9 % (ref 11.5–15.5)
WBC: 3.8 K/uL — ABNORMAL LOW (ref 4.0–10.5)
nRBC: 0 % (ref 0.0–0.2)

## 2024-02-05 LAB — BASIC METABOLIC PANEL WITH GFR
Anion gap: 9 (ref 5–15)
BUN: 11 mg/dL (ref 6–20)
CO2: 27 mmol/L (ref 22–32)
Calcium: 9.1 mg/dL (ref 8.9–10.3)
Chloride: 99 mmol/L (ref 98–111)
Creatinine, Ser: 0.78 mg/dL (ref 0.61–1.24)
GFR, Estimated: >60 mL/min
Glucose, Bld: 85 mg/dL (ref 70–99)
Potassium: 4.1 mmol/L (ref 3.5–5.1)
Sodium: 136 mmol/L (ref 135–145)

## 2024-02-05 LAB — MAGNESIUM: Magnesium: 1.8 mg/dL (ref 1.7–2.4)

## 2024-02-05 MED ORDER — CARVEDILOL 6.25 MG PO TABS: 12.5000 mg | ORAL_TABLET | Freq: Two times a day (BID) | ORAL | Status: AC

## 2024-02-05 MED ORDER — ACAMPROSATE CALCIUM 333 MG PO TBEC: 666.0000 mg | DELAYED_RELEASE_TABLET | Freq: Three times a day (TID) | ORAL | 0 refills | Status: AC

## 2024-02-05 MED ORDER — HYDROMORPHONE HCL 2 MG PO TABS: 2.0000 mg | ORAL_TABLET | Freq: Three times a day (TID) | ORAL | 0 refills | Status: AC | PRN

## 2024-02-05 MED ORDER — MAGNESIUM SULFATE 2 GM/50ML IV SOLN
2.0000 g | Freq: Once | INTRAVENOUS | Status: AC
  Administered 2024-02-05: 2 g via INTRAVENOUS
  Filled 2024-02-05: qty 50

## 2024-02-05 NOTE — TOC Transition Note (Signed)
 Transition of Care [facility]) - Discharge Note   Patient Details  Name: Brian [name] MRN: [id number] Date of Birth: 06-Sep-1970  [facility] CM/SW Contact:  Dalia GORMAN Fuse, RN Phone Number: 02/05/2024, 10:53 AM   Clinical Narrative:    Patient is medically clear to discharge to home. Subtance abuse resources previously added to the AVS. No TOC needs identified.    Final next level of care: Home/Self Care Barriers to Discharge: Barriers Resolved   Patient Goals and CMS Choice            Discharge Placement                       Discharge Plan and Services Additional resources added to the After Visit Summary for                                       Social Drivers of Health (SDOH) Interventions SDOH Screenings   Food Insecurity: No Food Insecurity (02/02/2024)  Housing: Low Risk (02/02/2024)  Transportation Needs: No Transportation Needs (02/02/2024)  Utilities: Not At Risk (02/02/2024)  Social Connections: Socially Isolated (02/02/2024)  Tobacco Use: Low Risk (02/02/2024)     Readmission Risk Interventions     No data to display

## 2024-02-05 NOTE — Plan of Care (Signed)

## 2024-02-07 NOTE — Discharge Summary (Signed)
 " Physician Discharge Summary   Patient: Brian [name] MRN: [id number] DOB: 12/31/1970  Admit date:     02/01/2024  Discharge date: 02/05/2024  Discharge Physician: Alban Pepper MD   PCP: Blair Waddell Browning, MD   Recommendations at discharge:    F/u regarding starting CBT for alcohol use disorder  Repeat iron  panel when appropriate, colonoscopy if has not had one   Discharge Diagnoses: Principal Problem:   Alcoholic pancreatitis Active Problems:   Pancreatitis  Resolved Problems:   * No resolved hospital problems. [facility] Course: Per HPI: Brian [name] is a 54 y.o. male with medical history significant of PAF on Eliquis , orthopedics, recurrent alcoholic pancreatitis, chronic pancreatic insufficiency, anxiety/depression, intermittent asthma, presented with worsening of abdominal pain nausea with vomiting.   Symptoms started 4 to 5 days ago, patient started to have cramping-like epigastric abdominal pain radiating to the back, associate with frequent nauseous vomiting after eating meals.  He started to take OTC NSAIDs initially with some help however he  last 2 days NSAIDS no longer helps and patient came to ED.  Denied any fever or chills, no diarrhea.  Claims that he does binge drinking on weekends and last drinking was 5 days ago   ED Course: Afebrile, borderline tachycardia blood pressure 118/78 O2 saturation 98% room air.  Blood work showed lipase more than 2800, AST 34 ALT 22 BUN 11 creatinine 0.8 WBC 11.8 hemoglobin 6.6.  CT abdomen pelvis showed diffuse pancreatitis but no peripancreatic fluid collection.   Patient was given IV bolus x 2 L and multiple rounds of Dilaudid  and Zofran .  Assessment and Plan: Recurrent alcoholic pancreatitis Elevated lipase, epigastric abdominal pain, and imaging findings consistent with acute pancreatitis He is status postcholecystectomy Patient able to tolerate solids by the time of discharge. Pain was controlled with oral  pain medicines. He was also continued on home creon .    Alcohol use disorder Patient has tried naltrexone and AA in the past. He will discharge on acamprosate .    Normocytic anemia Thrombocytopenia Due to alcohol use, iron  deficient folate WNL  No signs of bleeding. S/p 1x dose of venofer  300mg , will need to follow up with pcp when to start PO iron  and repeat labs. Will need colonoscopy as well.      Asthma Not in actue exacerbation. Discharged on home inhalers.    PAF Currently normal sinus rhythm, not taking amiodarone  outpatient.   Continue home Coreg  and Eliquis     HTN  Elevated, coreg  was increased, will need to follow up with pcp for further titration of home meds.    S/p AVR Aortic aneurysm s/p repair  Eliquis         Consultants: None Procedures performed: None  Disposition: Home Diet recommendation:  Low fat, low fiber for first few days, gradually reintroduce these foods.  DISCHARGE MEDICATION: Allergies as of 02/05/2024       Reactions   Metoprolol Anaphylaxis   AFFECTED MOTOR SKILLS Tolerated coreg  03/02/20   Lithium Other (See Comments)   Emotional issues admitted after taking   Valproic Acid Other (See Comments)   hallucinations   Depakote [divalproex Sodium]    psychosis   Penicillins Hives   Depakote [divalproex Sodium] Anxiety   Nsaids Other (See Comments)   Gastritis Contraindicated due to Eliquis  and Creon    Penicillins Rash   Tolerated Ancef  February 2022   Sulfa Antibiotics Rash        Medication List     PAUSE taking these medications  amiodarone  400 MG tablet Wait to take this until your doctor or other care provider tells you to start again. Commonly known as: PACERONE  TAKE 1 TABLET(400 MG) BY MOUTH TWICE DAILY   furosemide  20 MG tablet Wait to take this until your doctor or other care provider tells you to start again. Commonly known as: LASIX  Take 20 mg by mouth daily.       TAKE these medications    acamprosate   333 MG tablet Commonly known as: CAMPRAL  Take 2 tablets (666 mg total) by mouth 3 (three) times daily with meals.   albuterol  108 (90 Base) MCG/ACT inhaler Commonly known as: VENTOLIN  HFA Inhale 1 puff into the lungs every 4 (four) hours as needed for wheezing or shortness of breath.   atorvastatin  20 MG tablet Commonly known as: LIPITOR Take 20 mg by mouth daily.   carvedilol  6.25 MG tablet Commonly known as: COREG  Take 2 tablets (12.5 mg total) by mouth 2 (two) times daily. What changed: how much to take   clonazePAM  1 MG tablet Commonly known as: KLONOPIN  Take 1 tablet by mouth 3 (three) times daily as needed.   Creon  36000 UNITS Cpep capsule Generic drug: lipase/protease/amylase Take 1 capsule by mouth. 2 cap qam and 2 cap at noon , 2 cap qpm and 1 cap qhs = 7 caps a day   Eliquis  2.5 MG Tabs tablet Generic drug: apixaban  Take 5 mg by mouth 2 (two) times daily.   fenofibrate  145 MG tablet Commonly known as: TRICOR  Take 145 mg by mouth daily.   Focalin  XR 20 MG 24 hr capsule Generic drug: dexmethylphenidate  Take 20 mg by mouth every evening.   Focalin  XR 30 MG Cp24 Generic drug: Dexmethylphenidate  HCl Take 1 capsule by mouth every morning.   folic acid  1 MG tablet Commonly known as: FOLVITE  Take 1 tablet (1 mg total) by mouth daily.   HYDROmorphone  2 MG tablet Commonly known as: Dilaudid  Take 1 tablet (2 mg total) by mouth every 8 (eight) hours as needed for up to 5 days for severe pain (pain score 7-10).   magnesium  oxide 400 (240 Mg) MG tablet Commonly known as: MAG-OX Take 1 tablet (400 mg total) by mouth daily.   multivitamin with minerals Tabs tablet Take 1 tablet by mouth daily.   ondansetron  8 MG disintegrating tablet Commonly known as: ZOFRAN -ODT Take 8 mg by mouth 2 (two) times daily.   polyethylene glycol 17 g packet Commonly known as: MIRALAX  / GLYCOLAX  Take 1 packet by mouth daily.   sertraline  100 MG tablet Commonly known as:  ZOLOFT  Take 200 mg by mouth daily.   sildenafil  20 MG tablet Commonly known as: REVATIO  Take 1-5 tablets (50-100 mg total) by mouth daily as needed for erectile dysfunction.   Symbicort  80-4.5 MCG/ACT inhaler Generic drug: budesonide -formoterol  Inhale 2 puffs into the lungs at bedtime as needed.   thiamine  100 MG tablet Commonly known as: Vitamin B-1 Take 1 tablet (100 mg total) by mouth daily.        Follow-up Information     Blair Waddell Browning, MD Follow up.   Specialty: Obstetrics Why: hospital follow up Contact information: [address] [place] [address] [phone]                Discharge Exam: Fredricka Weights   02/01/24 2035 02/02/24 0216  Weight: 102.1 kg 102.8 kg   Physical Exam  Constitutional: In no distress.  Cardiovascular: Normal rate, regular rhythm. No lower  extremity edema  Pulmonary: Non labored breathing on room air, no wheezing or rales.   Abdominal: Soft. Non distended and non tender Musculoskeletal: Normal range of motion.     Neurological: Alert and oriented to person, place, and time. Non focal  Skin: Skin is warm and dry.    Condition at discharge: improving  The results of significant diagnostics from this hospitalization (including imaging, microbiology, ancillary and laboratory) are listed below for reference.   Imaging Studies: CT ABDOMEN PELVIS W CONTRAST Result Date: 02/01/2024 EXAM: CT ABDOMEN AND PELVIS WITH CONTRAST 02/01/2024 11:46:26 PM TECHNIQUE: CT of the abdomen and pelvis was performed with the administration of 100 mL of iohexol  (OMNIPAQUE ) 300 MG/ML solution. Multiplanar reformatted images are provided for review. Automated exposure control, iterative reconstruction, and/or weight-based adjustment of the mA/kV was utilized to reduce the radiation dose to as low as reasonably achievable. COMPARISON: 11/04/2021 CLINICAL HISTORY: Pancreatitis, acute, severe. FINDINGS: LOWER CHEST: No acute  abnormality. LIVER: Moderate hepatic steatosis. No intrahepatic mass. No intrahepatic biliary ductal dilation. Mild hepatomegaly. Trace perihepatic ascites. GALLBLADDER AND BILE DUCTS: Status post cholecystectomy. No biliary ductal dilatation. SPLEEN: Stable mild splenomegaly. No intrasplenic lesion. PANCREAS: Extensive peripancreatic inflammatory stranding involving the pancreas diffusely and changes of acute interstitial / edematous pancreatitis. Normal enhancement of the pancreatic parenchyma. No loculated peripancreatic fluid collections. The pancreatic duct is nondilated. ADRENAL GLANDS: No acute abnormality. KIDNEYS, URETERS AND BLADDER: The inflammatory fluid tracks into the left anterior perirenal space. No stones in the kidneys or ureters. No hydronephrosis. No perinephric or periureteral stranding. Urinary bladder is unremarkable. GI AND BOWEL: The inflammatory fluid tracks within the lesser sac to surround the gastric fundus as well as tracks into the mesenteric root. Appendix normal. The stomach, small bowel, and large bowel are otherwise unremarkable. There is no bowel obstruction. PERITONEUM AND RETROPERITONEUM: Trace perihepatic ascites. Small volume ascites within the pelvis. No free air. VASCULATURE: Aorta is normal in caliber. Status post aortic valve replacement. LYMPH NODES: No lymphadenopathy. REPRODUCTIVE ORGANS: No acute abnormality. BONES AND SOFT TISSUES: Interval T12 vertebroplasty. No acute osseous abnormality. No focal soft tissue abnormality. IMPRESSION: 1. Acute interstitial edematous pancreatitis with extensive peripancreatic inflammatory stranding, without loculated peripancreatic fluid collection, with inflammatory fluid tracking in the lesser sac, mesenteric root, and left anterior perirenal space. 2. Small volume ascites, including trace perihepatic and pelvic ascites. 3. Mild hepatomegaly with moderate hepatic steatosis. 4. Stable splenomegaly. 5. Status post aortic valve  replacement, cholecystectomy, and interval T12 vertebroplasty. Electronically signed by: Dorethia Molt MD MD 02/01/2024 11:56 PM EST RP Workstation: [id number]    Microbiology: Results for orders placed or performed during the hospital encounter of 05/12/22  Chlamydia/NGC rt PCR ([facility] only)     Status: None   Collection Time: 05/12/22 11:56 AM   Specimen: Urine  Result Value Ref Range Status   Specimen source GC/Chlam URINE, RANDOM  Final   Chlamydia Tr NOT DETECTED NOT DETECTED Final   N gonorrhoeae NOT DETECTED NOT DETECTED Final    Comment: (NOTE) This CT/NG assay has not been evaluated in patients with a history of  hysterectomy. Performed at [facility], [address] [place], [place] [address]     Labs: CBC: Recent Labs  Lab 02/01/24 2042 02/03/24 0754 02/04/24 1038 02/05/24 0530  WBC 11.8* 7.7 4.8 3.8*  NEUTROABS  --   --  3.0 2.4  HGB 16.6 12.7* 12.8* 12.9*  HCT 46.9 38.6* 37.2* 37.3*  MCV 88.0 94.1 89.9 89.4  PLT 170 128* 130*  128*   Basic Metabolic Panel: Recent Labs  Lab 02/01/24 2042 02/04/24 1038 02/05/24 0530  NA 138 135 136  K 4.3 4.2 4.1  CL 97* 100 99  CO2 27 26 27   GLUCOSE 127* 113* 85  BUN 11 12 11   CREATININE 0.85 0.73 0.78  CALCIUM  9.0 8.7* 9.1  MG  --  1.5* 1.8  PHOS  --  2.5  --    Liver Function Tests: Recent Labs  Lab 02/01/24 2042  AST 34  ALT 22  ALKPHOS 68  BILITOT 1.2  PROT 8.6*  ALBUMIN 4.8   CBG: No results for input(s): GLUCAP in the last 168 hours.  Discharge time spent: greater than 30 minutes.  Signed: Alban Pepper, MD [facility] 02/07/2024 "

## 2024-02-09 ENCOUNTER — Emergency Department
Admission: EM | Admit: 2024-02-09 | Discharge: 2024-02-09 | Disposition: A | Discharge status: Home / Self Care | Attending: Emergency Medicine | Admitting: Emergency Medicine

## 2024-02-09 ENCOUNTER — Other Ambulatory Visit: Payer: Self-pay

## 2024-02-09 ENCOUNTER — Emergency Department

## 2024-02-09 DIAGNOSIS — Z7901 Long term (current) use of anticoagulants: Secondary | ICD-10-CM | POA: Diagnosis not present

## 2024-02-09 DIAGNOSIS — I482 Chronic atrial fibrillation, unspecified: Secondary | ICD-10-CM | POA: Diagnosis not present

## 2024-02-09 DIAGNOSIS — K852 Alcohol induced acute pancreatitis without necrosis or infection: Secondary | ICD-10-CM | POA: Diagnosis not present

## 2024-02-09 DIAGNOSIS — R109 Unspecified abdominal pain: Secondary | ICD-10-CM | POA: Diagnosis present

## 2024-02-09 LAB — MAGNESIUM: Magnesium: 1.6 mg/dL — ABNORMAL LOW (ref 1.7–2.4)

## 2024-02-09 LAB — COMPREHENSIVE METABOLIC PANEL WITH GFR
ALT: 23 U/L (ref 0–44)
AST: 44 U/L — ABNORMAL HIGH (ref 15–41)
Albumin: 4 g/dL (ref 3.5–5.0)
Alkaline Phosphatase: 63 U/L (ref 38–126)
Anion gap: 11 (ref 5–15)
BUN: 10 mg/dL (ref 6–20)
CO2: 26 mmol/L (ref 22–32)
Calcium: 9.4 mg/dL (ref 8.9–10.3)
Chloride: 102 mmol/L (ref 98–111)
Creatinine, Ser: 0.75 mg/dL (ref 0.61–1.24)
GFR, Estimated: >60 mL/min
Glucose, Bld: 113 mg/dL — ABNORMAL HIGH (ref 70–99)
Potassium: 4 mmol/L (ref 3.5–5.1)
Sodium: 138 mmol/L (ref 135–145)
Total Bilirubin: 0.5 mg/dL (ref 0.0–1.2)
Total Protein: 8 g/dL (ref 6.5–8.1)

## 2024-02-09 LAB — CBC WITH DIFFERENTIAL/PLATELET
Abs Immature Granulocytes: 0.03 K/uL (ref 0.00–0.07)
Basophils Absolute: 0 K/uL (ref 0.0–0.1)
Basophils Relative: 0 %
Eosinophils Absolute: 0.3 K/uL (ref 0.0–0.5)
Eosinophils Relative: 6 %
HCT: 39.4 % (ref 39.0–52.0)
Hemoglobin: 13.5 g/dL (ref 13.0–17.0)
Immature Granulocytes: 1 %
Lymphocytes Relative: 23 %
Lymphs Abs: 1.2 K/uL (ref 0.7–4.0)
MCH: 30.5 pg (ref 26.0–34.0)
MCHC: 34.3 g/dL (ref 30.0–36.0)
MCV: 88.9 fL (ref 80.0–100.0)
Monocytes Absolute: 0.5 K/uL (ref 0.1–1.0)
Monocytes Relative: 9 %
Neutro Abs: 3 K/uL (ref 1.7–7.7)
Neutrophils Relative %: 61 %
Platelets: 175 K/uL (ref 150–400)
RBC: 4.43 MIL/uL (ref 4.22–5.81)
RDW: 12.9 % (ref 11.5–15.5)
WBC: 5 K/uL (ref 4.0–10.5)
nRBC: 0 % (ref 0.0–0.2)

## 2024-02-09 LAB — TROPONIN T, HIGH SENSITIVITY
Troponin T High Sensitivity: <15 ng/L (ref 0–19)
Troponin T High Sensitivity: <15 ng/L (ref 0–19)

## 2024-02-09 LAB — LIPASE, BLOOD: Lipase: 619 U/L — ABNORMAL HIGH (ref 11–51)

## 2024-02-09 MED ORDER — HYDROMORPHONE HCL 1 MG/ML IJ SOLN
0.5000 mg | Freq: Once | INTRAMUSCULAR | Status: AC
  Administered 2024-02-09: 0.5 mg via INTRAVENOUS
  Filled 2024-02-09: qty 0.5

## 2024-02-09 MED ORDER — IOHEXOL 300 MG/ML  SOLN
100.0000 mL | Freq: Once | INTRAMUSCULAR | Status: AC | PRN
  Administered 2024-02-09: 100 mL via INTRAVENOUS

## 2024-02-09 MED ORDER — LACTATED RINGERS IV BOLUS
1000.0000 mL | Freq: Once | INTRAVENOUS | Status: AC
  Administered 2024-02-09: 1000 mL via INTRAVENOUS

## 2024-02-09 MED ORDER — HYDROMORPHONE HCL 1 MG/ML IJ SOLN
1.0000 mg | Freq: Once | INTRAMUSCULAR | Status: AC
  Administered 2024-02-09: 1 mg via INTRAVENOUS
  Filled 2024-02-09: qty 1

## 2024-02-09 MED ORDER — ONDANSETRON HCL 4 MG/2ML IJ SOLN
4.0000 mg | Freq: Once | INTRAMUSCULAR | Status: AC
  Administered 2024-02-09: 4 mg via INTRAVENOUS
  Filled 2024-02-09: qty 2

## 2024-02-09 MED ORDER — OXYCODONE HCL 5 MG PO TABS
10.0000 mg | ORAL_TABLET | Freq: Once | ORAL | Status: AC
  Administered 2024-02-09: 10 mg via ORAL
  Filled 2024-02-09: qty 2

## 2024-02-09 MED ORDER — HYDROMORPHONE HCL 2 MG PO TABS: 2.0000 mg | ORAL_TABLET | Freq: Two times a day (BID) | ORAL | 0 refills | Status: AC | PRN

## 2024-02-09 NOTE — ED Triage Notes (Signed)
 Pt to ED via POV for abdominal pain. Pt states that he was discharged on 1/13 after being admitted for pancreatitis. Pt denies ETOH since being discharged. Pt states that his pain has been on going since being released.

## 2024-02-09 NOTE — ED Notes (Signed)
 Patient transported to CT

## 2024-02-09 NOTE — ED Notes (Signed)
 Pt c/o of central abdominal pain w/ tenderness to palpation- states it feels like a continuation of pancreatitis. Pt takes creon  and miralax - denies N/V, reports diarrhea x2 days and increase in pain w/ eating and drinking. Pt states he has stopped drinking ETOH.

## 2024-02-09 NOTE — ED Provider Notes (Signed)
 "  [facility] Provider Note    Event Date/Time   First MD Initiated Contact with Patient 02/09/24 [phone]     (approximate)   History   Abdominal Pain   HPI  Brian [name] is a 54 y.o. male who presents to the ED for evaluation of Abdominal Pain   I reviewed medical DC summary from 4 days ago.  Patient admitted for alcoholic pancreatitis.  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  S/p remote cholecystectomy.  Patient presents due to continued pain to his periumbilical and epigastric abdomen since this recent discharge.  Reports oral hydromorphone  has been ineffective at controlling his pain.  Denies any emesis, fevers, chest pain, dyspnea, syncope, stool changes.  Reports it has been hard to eat much food, denies returning to ethanol   Physical Exam   Triage Vital Signs: ED Triage Vitals  Encounter Vitals Group     BP 02/09/24 0854 (!) 154/110     Girls Systolic BP Percentile --      Girls Diastolic BP Percentile --      Boys Systolic BP Percentile --      Boys Diastolic BP Percentile --      Pulse Rate 02/09/24 0854 91     Resp 02/09/24 0854 16     Temp 02/09/24 0855 98.2 F (36.8 C)     Temp src --      SpO2 02/09/24 0854 96 %     Weight 02/09/24 0854 224 lb 13.9 oz (102 kg)     Height 02/09/24 0854 6' 1 (1.854 m)     Head Circumference --      Peak Flow --      Pain Score 02/09/24 0854 8     Pain Loc --      Pain Education --      Exclude from Growth Chart --     Most recent vital signs: Vitals:   02/09/24 1030 02/09/24 1100  BP: (!) 148/103 (!) 158/101  Pulse: 76 75  Resp: 19 17  Temp:    SpO2: 93% 94%    General: Awake, no distress.  CV:  Good peripheral perfusion.  Resp:  Normal effort.  Abd:  No distention.  Mild periumbilical and epigastric tenderness without guarding or peritoneal features. MSK:  No deformity noted.  Neuro:  No focal deficits appreciated. Other:     ED Results / Procedures /  Treatments   Labs (all labs ordered are listed, but only abnormal results are displayed) Labs Reviewed  COMPREHENSIVE METABOLIC PANEL WITH GFR - Abnormal; Notable for the following components:      Result Value   Glucose, Bld 113 (*)    AST 44 (*)    All other components within normal limits  LIPASE, BLOOD - Abnormal; Notable for the following components:   Lipase 619 (*)    All other components within normal limits  MAGNESIUM  - Abnormal; Notable for the following components:   Magnesium  1.6 (*)    All other components within normal limits  CBC WITH DIFFERENTIAL/PLATELET  URINALYSIS, ROUTINE W REFLEX MICROSCOPIC  TROPONIN T, HIGH SENSITIVITY  TROPONIN T, HIGH SENSITIVITY    EKG Sinus rhythm with a rate of 88 bpm.  Normal axis, QTc 495.  Nonspecific ST changes with inverted T waves to lateral and inferior leads.  No STEMI. Similar to comparison EKG from March of last year.  RADIOLOGY CT abdomen/pelvis interpreted by me with waning peripancreatic stranding without other complicating features  Official radiology report(s): CT ABDOMEN PELVIS W CONTRAST Result Date: 02/09/2024 EXAM: CT ABDOMEN AND PELVIS WITH CONTRAST 02/09/2024 11:21:45 AM TECHNIQUE: CT of the abdomen and pelvis was performed with the administration of 100 mL iohexol  (OMNIPAQUE ) 300 MG/ML solution. Multiplanar reformatted images are provided for review. Automated exposure control, iterative reconstruction, and/or weight-based adjustment of the mA/kV was utilized to reduce the radiation dose to as low as reasonably achievable. COMPARISON: 02/01/2024 CLINICAL HISTORY: Worsening symptoms of alcoholic pancreatitis, eval fluid collection, complications. FINDINGS: LOWER CHEST: No acute abnormality. LIVER: Likely hepatic steatosis. GALLBLADDER AND BILE DUCTS: The gallbladder is not visualized. No biliary ductal dilatation. SPLEEN: Unchanged mild splenomegaly. PANCREAS: Interval decrease in previously seen pancreatic inflammatory  stranding and free fluid. No loculated fluid collections identified. ADRENAL GLANDS: No acute abnormality. KIDNEYS, URETERS AND BLADDER: No stones in the kidneys or ureters. No hydronephrosis. No perinephric or periureteral stranding. Urinary bladder is unremarkable. GI AND BOWEL: Stomach demonstrates no acute abnormality. There is no bowel obstruction. PERITONEUM AND RETROPERITONEUM: No ascites. No free air. VASCULATURE: Aorta is normal in caliber. LYMPH NODES: No lymphadenopathy. REPRODUCTIVE ORGANS: No acute abnormality. BONES AND SOFT TISSUES: Sequelae of prior T12 vertebral augmentation. No focal soft tissue abnormality. IMPRESSION: 1. Interval decrease in peripancreatic inflammatory stranding and free fluid. No loculated fluid collections identified. Electronically signed by: Michaeline Blanch MD 02/09/2024 11:51 AM EST RP Workstation: [id number]    PROCEDURES and INTERVENTIONS:  Procedures  Medications  lactated ringers  bolus 1,000 mL (0 mLs Intravenous Stopped 02/09/24 1254)  HYDROmorphone  (DILAUDID ) injection 1 mg (1 mg Intravenous Given 02/09/24 0943)  ondansetron  (ZOFRAN ) injection 4 mg (4 mg Intravenous Given 02/09/24 0943)  HYDROmorphone  (DILAUDID ) injection 1 mg (1 mg Intravenous Given 02/09/24 1049)  oxyCODONE  (Oxy IR/ROXICODONE ) immediate release tablet 10 mg (10 mg Oral Given 02/09/24 1049)  iohexol  (OMNIPAQUE ) 300 MG/ML solution 100 mL (100 mLs Intravenous Contrast Given 02/09/24 1112)  HYDROmorphone  (DILAUDID ) injection 0.5 mg (0.5 mg Intravenous Given 02/09/24 1254)     IMPRESSION / MDM / ASSESSMENT AND PLAN / ED COURSE  I reviewed the triage vital signs and the nursing notes.  Differential diagnosis includes, but is not limited to, peripancreatic abscess or pseudocyst, worsening pancreatitis, drug-seeking behavior, ACS  {Patient presents with symptoms of an acute illness or injury that is potentially life-threatening.  Patient presents with continued symptoms of alcoholic  pancreatitis, suitable for outpatient management.  Mild localized tenderness but generally looks well.  Blood work with lipase of 600, fairly similar as when he was discharged a few days ago, improved from his initial lipase at the beginning of that admission.  Otherwise normal CBC, metabolic panel, negative troponins.  CT, as above is improving.  His pain is controlled he is tolerating p.o. with liquids and suitable for outpatient management.  Clinical Course as of 02/09/24 1311  Sat Feb 09, 2024  1236 Reassessed.  Discussed CT results, lipase and remainder of blood work, plan of care.  His pain is controlled and he is eager to go home [DS]    Clinical Course User Index [DS] Claudene Rover, MD     FINAL CLINICAL IMPRESSION(S) / ED DIAGNOSES   Final diagnoses:  Alcohol-induced acute pancreatitis without infection or necrosis     Rx / DC Orders   ED Discharge Orders          Ordered    HYDROmorphone  (DILAUDID ) 2 MG tablet  Every 12 hours PRN        02/09/24 1242  Note:  This document was prepared using Dragon voice recognition software and may include unintentional dictation errors.   Claudene Rover, MD 02/09/24 1313  "
